# Patient Record
Sex: Male | Born: 1965 | Race: Black or African American | Hispanic: No | Marital: Single | Smoking: Never smoker
Health system: Southern US, Community
[De-identification: ages and names within clinical notes are randomized; demographics above are authoritative.]

## PROBLEM LIST (undated history)

## (undated) DIAGNOSIS — M549 Dorsalgia, unspecified: Secondary | ICD-10-CM

## (undated) DIAGNOSIS — E785 Hyperlipidemia, unspecified: Secondary | ICD-10-CM

## (undated) HISTORY — DX: Dorsalgia, unspecified: M54.9

## (undated) HISTORY — DX: Hyperlipidemia, unspecified: E78.5

---

## 2017-01-06 ENCOUNTER — Ambulatory Visit (INDEPENDENT_AMBULATORY_CARE_PROVIDER_SITE_OTHER): Payer: 59 | Admitting: Family Medicine

## 2017-01-06 ENCOUNTER — Encounter: Payer: Self-pay | Admitting: Family Medicine

## 2017-01-06 VITALS — BP 99/59 | HR 88 | Temp 97.7°F | Ht 73.0 in | Wt 217.0 lb

## 2017-01-06 DIAGNOSIS — M545 Low back pain, unspecified: Secondary | ICD-10-CM

## 2017-01-06 DIAGNOSIS — G8929 Other chronic pain: Secondary | ICD-10-CM | POA: Diagnosis not present

## 2017-01-06 DIAGNOSIS — Z87448 Personal history of other diseases of urinary system: Secondary | ICD-10-CM | POA: Diagnosis not present

## 2017-01-06 DIAGNOSIS — E785 Hyperlipidemia, unspecified: Secondary | ICD-10-CM | POA: Diagnosis not present

## 2017-01-06 LAB — URINALYSIS
BILIRUBIN UA: NEGATIVE
Glucose, UA: NEGATIVE
Ketones, UA: NEGATIVE
Leukocytes, UA: NEGATIVE
NITRITE UA: NEGATIVE
PH UA: 7 (ref 5.0–7.5)
Protein, UA: NEGATIVE
SPEC GRAV UA: 1.02 (ref 1.005–1.030)
Urobilinogen, Ur: 0.2 mg/dL (ref 0.2–1.0)

## 2017-01-06 MED ORDER — CIALIS 20 MG PO TABS: 20.0000 mg | ORAL_TABLET | ORAL | 5 refills | Status: DC | PRN

## 2017-01-06 MED ORDER — ATORVASTATIN CALCIUM 40 MG PO TABS: 40.0000 mg | ORAL_TABLET | Freq: Every day | ORAL | 1 refills | Status: DC

## 2017-01-06 NOTE — Progress Notes (Signed)
Subjective:  Patient ID: Ryan Rojas, male    DOB: [birth year]  Age: 51 y.o. MRN: [id number]  CC: New Patient (Initial Visit) (pt here today to establish care, no concerns voiced.)   HPI Ryan Rojas presents for New patient visit and exam. He wants to become a patient here. Very frustrated that his previous physician could never worked him in when he needed them instead he was always told To go to the ER or wait several weeks for an appointment. The patient has been in [place] for about 6 years. He moved here from the [place] of [place]. He is divorced. He's never been a smoker. He has put on a few pounds recently and wants to take those back off. He currently is having no acute symptoms. However, he does see a back doctor/neurologist at the [facility] in [place]. He has chronic midline lower back pain over the small of his back. Patient does take atorvastatin for cholesterol. He denies any side effects from that.  History Ryan Rojas has a past medical history of Hyperlipidemia.   He has no past surgical history on file.   His family history includes Asthma in his mother and sister; Diabetes in his sister.He reports that he has never smoked. He has never used smokeless tobacco. He reports that he drinks about 0.6 oz of alcohol per week . He reports that he does not use drugs.  No current outpatient prescriptions on file prior to visit.   No current facility-administered medications on file prior to visit.     ROS Review of Systems  Constitutional: Negative for activity change, appetite change, chills, diaphoresis, fatigue, fever and unexpected weight change.  HENT: Negative for congestion, ear pain, hearing loss, postnasal drip, rhinorrhea, sore throat, tinnitus and trouble swallowing.   Eyes: Negative for photophobia, pain, discharge and redness.  Respiratory: Negative for apnea, cough, choking, chest tightness, shortness of breath, wheezing and stridor.   Cardiovascular:  Negative for chest pain, palpitations and leg swelling.  Gastrointestinal: Negative for abdominal distention, abdominal pain, blood in stool, constipation, diarrhea, nausea and vomiting.  Endocrine: Negative for cold intolerance, heat intolerance, polydipsia, polyphagia and polyuria.  Genitourinary: Positive for hematuria (this has been chronicEver since he was a teenager). Negative for difficulty urinating, dysuria, enuresis, flank pain, frequency, genital sores and urgency.  Musculoskeletal: Positive for back pain. Negative for arthralgias and joint swelling.  Skin: Negative for color change, rash and wound.  Allergic/Immunologic: Negative for immunocompromised state.  Neurological: Negative for dizziness, tremors, seizures, syncope, facial asymmetry, speech difficulty, weakness, light-headedness, numbness and headaches.  Hematological: Does not bruise/bleed easily.  Psychiatric/Behavioral: Negative for agitation, behavioral problems, confusion, decreased concentration, dysphoric mood, hallucinations, sleep disturbance and suicidal ideas. The patient is not nervous/anxious and is not hyperactive.     Objective:  BP (!) 99/59   Pulse 88   Temp 97.7 F (36.5 C) (Oral)   Ht _0  (1.854 m)   Wt 217 lb (98.4 kg)   BMI 28.63 kg/m   Physical Exam  Constitutional: He is oriented to person, place, and time. He appears well-developed and well-nourished.  HENT:  Head: Normocephalic and atraumatic.  Mouth/Throat: Oropharynx is clear and moist.  Eyes: Pupils are equal, round, and reactive to light. EOM are normal.  Neck: Normal range of motion. No tracheal deviation present. No thyromegaly present.  Cardiovascular: Normal rate, regular rhythm and normal heart sounds.  Exam reveals no gallop and no friction rub.   No murmur heard. Pulmonary/Chest: Breath  sounds normal. He has no wheezes. He has no rales.  Abdominal: Soft. He exhibits no mass. There is no tenderness.  Musculoskeletal: Normal  range of motion. He exhibits no edema.  Neurological: He is alert and oriented to person, place, and time.  Skin: Skin is warm and dry.  Psychiatric: He has a normal mood and affect.    Assessment & Plan:   Ryan Rojas was seen today for new patient (initial visit).  Diagnoses and all orders for this visit:  Hyperlipidemia, unspecified hyperlipidemia type -     CBC with Differential/Platelet -     CMP14+EGFR -     Lipid panel  History of hematuria -     Urinalysis  Chronic midline low back pain without sciatica  Other orders -     atorvastatin (LIPITOR) 40 MG tablet; Take 1 tablet (40 mg total) by mouth daily. -     CIALIS 20 MG tablet; Take 1 tablet (20 mg total) by mouth as needed.   I have changed Mr. Ryan Rojas atorvastatin and CIALIS.  Meds ordered this encounter  Medications  . DISCONTD: atorvastatin (LIPITOR) 40 MG tablet    Sig: Take 40 mg by mouth daily.  Marland Kitchen DISCONTD: CIALIS 20 MG tablet    Sig: Take 20 mg by mouth as needed.    Refill:  5  . atorvastatin (LIPITOR) 40 MG tablet    Sig: Take 1 tablet (40 mg total) by mouth daily.    Dispense:  90 tablet    Refill:  1  . CIALIS 20 MG tablet    Sig: Take 1 tablet (20 mg total) by mouth as needed.    Dispense:  10 tablet    Refill:  5     Follow-up: Return in about 6 months (around 07/07/2017).  Claretta Fraise, M.D.

## 2017-01-07 LAB — LIPID PANEL
CHOL/HDL RATIO: 2.6 ratio (ref 0.0–5.0)
Cholesterol, Total: 211 mg/dL — ABNORMAL HIGH (ref 100–199)
HDL: 81 mg/dL (ref >39)
LDL Calculated: 115 mg/dL — ABNORMAL HIGH (ref 0–99)
Triglycerides: 74 mg/dL (ref 0–149)
VLDL Cholesterol Cal: 15 mg/dL (ref 5–40)

## 2017-01-07 LAB — CBC WITH DIFFERENTIAL/PLATELET
BASOS: 1 %
Basophils Absolute: 0 10*3/uL (ref 0.0–0.2)
EOS (ABSOLUTE): 0.2 10*3/uL (ref 0.0–0.4)
EOS: 3 %
HEMATOCRIT: 45.6 % (ref 37.5–51.0)
Hemoglobin: 14.3 g/dL (ref 13.0–17.7)
IMMATURE GRANS (ABS): 0 10*3/uL (ref 0.0–0.1)
IMMATURE GRANULOCYTES: 0 %
Lymphocytes Absolute: 2.3 10*3/uL (ref 0.7–3.1)
Lymphs: 38 %
MCH: 27.7 pg (ref 26.6–33.0)
MCHC: 31.4 g/dL — ABNORMAL LOW (ref 31.5–35.7)
MCV: 88 fL (ref 79–97)
MONOS ABS: 0.5 10*3/uL (ref 0.1–0.9)
Monocytes: 7 %
NEUTROS ABS: 3.1 10*3/uL (ref 1.4–7.0)
NEUTROS PCT: 51 %
Platelets: 257 10*3/uL (ref 150–379)
RBC: 5.17 x10E6/uL (ref 4.14–5.80)
RDW: 14.7 % (ref 12.3–15.4)
WBC: 6.1 10*3/uL (ref 3.4–10.8)

## 2017-01-07 LAB — CMP14+EGFR
A/G RATIO: 1.9 (ref 1.2–2.2)
ALT: 24 IU/L (ref 0–44)
AST: 31 IU/L (ref 0–40)
Albumin: 5 g/dL (ref 3.5–5.5)
Alkaline Phosphatase: 58 IU/L (ref 39–117)
BILIRUBIN TOTAL: 0.5 mg/dL (ref 0.0–1.2)
BUN/Creatinine Ratio: 13 (ref 9–20)
BUN: 15 mg/dL (ref 6–24)
CALCIUM: 9.8 mg/dL (ref 8.7–10.2)
CHLORIDE: 97 mmol/L (ref 96–106)
CO2: 27 mmol/L (ref 20–29)
Creatinine, Ser: 1.19 mg/dL (ref 0.76–1.27)
GFR, EST AFRICAN AMERICAN: 82 mL/min/{1.73_m2} (ref >59)
GFR, EST NON AFRICAN AMERICAN: 71 mL/min/{1.73_m2} (ref >59)
GLOBULIN, TOTAL: 2.6 g/dL (ref 1.5–4.5)
Glucose: 68 mg/dL (ref 65–99)
POTASSIUM: 4.5 mmol/L (ref 3.5–5.2)
SODIUM: 142 mmol/L (ref 134–144)
TOTAL PROTEIN: 7.6 g/dL (ref 6.0–8.5)

## 2017-01-21 ENCOUNTER — Encounter: Payer: Self-pay | Admitting: *Deleted

## 2017-04-06 ENCOUNTER — Telehealth: Payer: Self-pay | Admitting: Family Medicine

## 2017-04-07 ENCOUNTER — Other Ambulatory Visit: Payer: Self-pay

## 2017-04-07 ENCOUNTER — Other Ambulatory Visit: Payer: Self-pay | Admitting: Family Medicine

## 2017-04-07 MED ORDER — CIALIS 20 MG PO TABS: 20.0000 mg | ORAL_TABLET | ORAL | 5 refills | Status: DC | PRN

## 2017-04-07 MED ORDER — SILDENAFIL CITRATE 20 MG PO TABS: 20.0000 mg | ORAL_TABLET | Freq: Every day | ORAL | 5 refills | Status: DC | PRN

## 2017-04-07 MED ORDER — ATORVASTATIN CALCIUM 40 MG PO TABS: 40.0000 mg | ORAL_TABLET | Freq: Every day | ORAL | 1 refills | Status: DC

## 2017-04-07 NOTE — Telephone Encounter (Signed)
Please contact the patient and let him know that I [facility] 3 requested [facility] [place]

## 2017-04-07 NOTE — Telephone Encounter (Signed)
Patient notified that all meds sent to pharmacy

## 2017-04-07 NOTE — Telephone Encounter (Signed)
Spoke with patient and atorvastatin and cialis that was sent in last oct was never received by the pharmacy. Confirmed with pharmacy and resent meds. Patient also wanting to know if he can have a rx for Viagra as well so he can alternate and see which one works better. Please advise

## 2017-04-09 ENCOUNTER — Telehealth: Payer: Self-pay

## 2017-04-09 NOTE — Telephone Encounter (Signed)
Insurance denied prior auth for Cialis

## 2017-04-09 NOTE — Telephone Encounter (Signed)
Please contact the patient that he noted that the brand name is not covered.  See if he would like to switch to generic Viagra.

## 2017-04-09 NOTE — Telephone Encounter (Signed)
Attempted to contact patient - NA °

## 2017-04-09 NOTE — Telephone Encounter (Signed)
Pt doesn't want to try generic viagra and says he will just pay out of pocket for the Cialis.

## 2017-07-07 ENCOUNTER — Ambulatory Visit: Payer: 59 | Admitting: Family Medicine

## 2017-07-16 ENCOUNTER — Encounter: Payer: Self-pay | Admitting: Family Medicine

## 2017-07-16 ENCOUNTER — Ambulatory Visit: Payer: 59 | Admitting: Family Medicine

## 2017-07-16 VITALS — BP 128/84 | HR 72 | Temp 97.0°F | Ht 73.0 in | Wt 223.4 lb

## 2017-07-16 DIAGNOSIS — R059 Cough, unspecified: Secondary | ICD-10-CM

## 2017-07-16 DIAGNOSIS — N5203 Combined arterial insufficiency and corporo-venous occlusive erectile dysfunction: Secondary | ICD-10-CM | POA: Diagnosis not present

## 2017-07-16 DIAGNOSIS — R05 Cough: Secondary | ICD-10-CM | POA: Diagnosis not present

## 2017-07-16 DIAGNOSIS — E785 Hyperlipidemia, unspecified: Secondary | ICD-10-CM | POA: Insufficient documentation

## 2017-07-16 DIAGNOSIS — E782 Mixed hyperlipidemia: Secondary | ICD-10-CM

## 2017-07-16 MED ORDER — SILDENAFIL CITRATE 100 MG PO TABS: 50.0000 mg | ORAL_TABLET | Freq: Every day | ORAL | 1 refills | Status: DC | PRN

## 2017-07-16 MED ORDER — CIALIS 20 MG PO TABS: 20.0000 mg | ORAL_TABLET | ORAL | 1 refills | Status: DC | PRN

## 2017-07-16 MED ORDER — ATORVASTATIN CALCIUM 40 MG PO TABS: 40.0000 mg | ORAL_TABLET | Freq: Every day | ORAL | 1 refills | Status: DC

## 2017-07-16 MED ORDER — SILDENAFIL CITRATE 20 MG PO TABS: 20.0000 mg | ORAL_TABLET | Freq: Every day | ORAL | 5 refills | Status: DC | PRN

## 2017-07-16 MED ORDER — PREDNISONE 10 MG (48) PO TBPK: ORAL_TABLET | ORAL | 0 refills | Status: DC

## 2017-07-16 NOTE — Patient Instructions (Signed)
Fat and Cholesterol Restricted Diet Getting too much fat and cholesterol in your diet may cause health problems. Following this diet helps keep your fat and cholesterol at normal levels. This can keep you from getting sick. What types of fat should I choose?  Choose monosaturated and polyunsaturated fats. These are found in foods such as olive oil, canola oil, flaxseeds, walnuts, almonds, and seeds.  Eat more omega-3 fats. Good choices include salmon, mackerel, sardines, tuna, flaxseed oil, and ground flaxseeds.  Limit saturated fats. These are in animal products such as meats, butter, and cream. They can also be in plant products such as palm oil, palm kernel oil, and coconut oil.  Avoid foods with partially hydrogenated oils in them. These contain trans fats. Examples of foods that have trans fats are stick margarine, some tub margarines, cookies, crackers, and other baked goods. What general guidelines do I need to follow?  Check food labels. Look for the words "trans fat" and "saturated fat."  When preparing a meal: ? Fill half of your plate with vegetables and green salads. ? Fill one fourth of your plate with whole grains. Look for the word "whole" as the first word in the ingredient list. ? Fill one fourth of your plate with lean protein foods.  Eat more foods that have fiber, like apples, carrots, beans, peas, and barley.  Eat more home-cooked foods. Eat less at restaurants and buffets.  Limit or avoid alcohol.  Limit foods high in starch and sugar.  Limit fried foods.  Cook foods without frying them. Baking, boiling, grilling, and broiling are all great options.  Lose weight if you are overweight. Losing even a small amount of weight can help your overall health. It can also help prevent diseases such as diabetes and heart disease. What foods can I eat? Grains Whole grains, such as whole wheat or whole grain breads, crackers, cereals, and pasta. Unsweetened oatmeal,  bulgur, barley, quinoa, or brown rice. Corn or whole wheat flour tortillas. Vegetables Fresh or frozen vegetables (raw, steamed, roasted, or grilled). Green salads. Fruits All fresh, canned (in natural juice), or frozen fruits. Meat and Other Protein Products Ground beef (85% or leaner), grass-fed beef, or beef trimmed of fat. Skinless chicken or turkey. Ground chicken or turkey. Pork trimmed of fat. All fish and seafood. Eggs. Dried beans, peas, or lentils. Unsalted nuts or seeds. Unsalted canned or dry beans. Dairy Low-fat dairy products, such as skim or 1% milk, 2% or reduced-fat cheeses, low-fat ricotta or cottage cheese, or plain low-fat yogurt. Fats and Oils Tub margarines without trans fats. Light or reduced-fat mayonnaise and salad dressings. Avocado. Olive, canola, sesame, or safflower oils. Natural peanut or almond butter (choose ones without added sugar and oil). The items listed above may not be a complete list of recommended foods or beverages. Contact your dietitian for more options. What foods are not recommended? Grains White bread. White pasta. White rice. Cornbread. Bagels, pastries, and croissants. Crackers that contain trans fat. Vegetables White potatoes. Corn. Creamed or fried vegetables. Vegetables in a cheese sauce. Fruits Dried fruits. Canned fruit in light or heavy syrup. Fruit juice. Meat and Other Protein Products Fatty cuts of meat. Ribs, chicken wings, bacon, sausage, bologna, salami, chitterlings, fatback, hot dogs, bratwurst, and packaged luncheon meats. Liver and organ meats. Dairy Whole or 2% milk, cream, half-and-half, and cream cheese. Whole milk cheeses. Whole-fat or sweetened yogurt. Full-fat cheeses. Nondairy creamers and whipped toppings. Processed cheese, cheese spreads, or cheese curds. Sweets and Desserts Corn   syrup, sugars, honey, and molasses. Candy. Jam and jelly. Syrup. Sweetened cereals. Cookies, pies, cakes, donuts, muffins, and ice  cream. Fats and Oils Butter, stick margarine, lard, shortening, ghee, or bacon fat. Coconut, palm kernel, or palm oils. Beverages Alcohol. Sweetened drinks (such as sodas, lemonade, and fruit drinks or punches). The items listed above may not be a complete list of foods and beverages to avoid. Contact your dietitian for more information. This information is not intended to replace advice given to you by your health care provider. Make sure you discuss any questions you have with your health care provider. Document Released: 09/01/2011 Document Revised: 11/07/2015 Document Reviewed: 06/01/2013 Elsevier Interactive Patient Education  2018 Elsevier Inc.  

## 2017-07-16 NOTE — Progress Notes (Signed)
Subjective:  Patient ID: Ryan Rojas, male    DOB: [birth year]  Age: 52 y.o. MRN: [id number]  CC: No chief complaint on file.   HPI Ryan Rojas presents for follow-up of elevated cholesterol. Doing well without complaints on current medication. Denies side effects of statin including myalgia and arthralgia and nausea. Also in today for liver function testing. Currently no chest pain, shortness of breath or other cardiovascular related symptoms noted.  Patient would like to have prescriptions for Cialis, Viagra, sildenafil.  He would like to shop them due to insurance increase.  His Cialis went up to $400 per month.  He tried the sildenafil but said it was ineffective on the other hand he only tried 1 pill at a time.  After discussion he is willing to try it again using up to 5 pills per episode in order to save some money. History Ryan Rojas has a past medical history of Hyperlipidemia.   He has no past surgical history on file.   His family history includes Asthma in his mother and sister; Diabetes in his sister.He reports that he has never smoked. He has never used smokeless tobacco. He reports that he drinks about 0.6 oz of alcohol per week. He reports that he does not use drugs.  No current outpatient medications on file prior to visit.   No current facility-administered medications on file prior to visit.     ROS Review of Systems  Constitutional: Negative.   HENT: Negative.   Eyes: Negative for visual disturbance.  Respiratory: Positive for cough (Started after a bout of the flu several weeks ago.  It is nonproductive.  It is not associated with any fever.  There is no shortness of breath.). Negative for shortness of breath.   Cardiovascular: Negative for chest pain and leg swelling.  Gastrointestinal: Negative for abdominal pain, diarrhea, nausea and vomiting.  Genitourinary: Negative for difficulty urinating.  Musculoskeletal: Negative for arthralgias and myalgias.  Skin:  Negative for rash.  Neurological: Negative for headaches.  Psychiatric/Behavioral: Negative for sleep disturbance.    Objective:  BP 128/84   Pulse 72   Temp (!) 97 F (36.1 C) (Oral)   Ht 6' 1"  (1.854 m)   Wt 223 lb 6 oz (101.3 kg)   BMI 29.47 kg/m   BP Readings from Last 3 Encounters:  07/16/17 128/84  01/06/17 (!) 99/59    Wt Readings from Last 3 Encounters:  07/16/17 223 lb 6 oz (101.3 kg)  01/06/17 217 lb (98.4 kg)     Physical Exam  Constitutional: He is oriented to person, place, and time. He appears well-developed and well-nourished. No distress.  HENT:  Head: Normocephalic and atraumatic.  Right Ear: External ear normal.  Left Ear: External ear normal.  Nose: Nose normal.  Mouth/Throat: Oropharynx is clear and moist.  Eyes: Pupils are equal, round, and reactive to light. Conjunctivae and EOM are normal.  Neck: Normal range of motion. Neck supple. No thyromegaly present.  Cardiovascular: Normal rate, regular rhythm and normal heart sounds.  No murmur heard. Pulmonary/Chest: Effort normal and breath sounds normal. No respiratory distress. He has no wheezes. He has no rales.  Abdominal: Soft. Bowel sounds are normal. He exhibits no distension. There is no tenderness.  Lymphadenopathy:    He has no cervical adenopathy.  Neurological: He is alert and oriented to person, place, and time. He has normal reflexes.  Skin: Skin is warm and dry.  Psychiatric: He has a normal mood and affect. His behavior is  normal. Judgment and thought content normal.    No results found for: HGBA1C  Lab Results  Component Value Date   WBC 6.1 01/06/2017   HGB 14.3 01/06/2017   HCT 45.6 01/06/2017   PLT 257 01/06/2017   GLUCOSE 68 01/06/2017   CHOL 211 (H) 01/06/2017   TRIG 74 01/06/2017   HDL 81 01/06/2017   LDLCALC 115 (H) 01/06/2017   ALT 24 01/06/2017   AST 31 01/06/2017   NA 142 01/06/2017   K 4.5 01/06/2017   CL 97 01/06/2017   CREATININE 1.19 01/06/2017   BUN 15  01/06/2017   CO2 27 01/06/2017    Patient was never admitted.  Assessment & Plan:   Diagnoses and all orders for this visit:  Combined arterial insufficiency and corporo-venous occlusive erectile dysfunction -     CIALIS 20 MG tablet; Take 1 tablet (20 mg total) by mouth as needed. -     sildenafil (REVATIO) 20 MG tablet; Take 1 tablet (20 mg total) by mouth daily as needed (2-5 as needed). -     sildenafil (VIAGRA) 100 MG tablet; Take 0.5-1 tablets (50-100 mg total) by mouth daily as needed for erectile dysfunction.  Mixed hyperlipidemia -     atorvastatin (LIPITOR) 40 MG tablet; Take 1 tablet (40 mg total) by mouth daily. -     CMP14+EGFR -     Lipid panel  Cough -     predniSONE (STERAPRED UNI-PAK 48 TAB) 10 MG (48) TBPK tablet; Take as directed   I am having Ryan Rojas start on sildenafil and predniSONE. I am also having him maintain his CIALIS, sildenafil, and atorvastatin.  Meds ordered this encounter  Medications  . CIALIS 20 MG tablet    Sig: Take 1 tablet (20 mg total) by mouth as needed.    Dispense:  24 tablet    Refill:  1  . sildenafil (REVATIO) 20 MG tablet    Sig: Take 1 tablet (20 mg total) by mouth daily as needed (2-5 as needed).    Dispense:  50 tablet    Refill:  5  . sildenafil (VIAGRA) 100 MG tablet    Sig: Take 0.5-1 tablets (50-100 mg total) by mouth daily as needed for erectile dysfunction.    Dispense:  24 tablet    Refill:  1  . predniSONE (STERAPRED UNI-PAK 48 TAB) 10 MG (48) TBPK tablet    Sig: Take as directed    Dispense:  48 tablet    Refill:  0  . atorvastatin (LIPITOR) 40 MG tablet    Sig: Take 1 tablet (40 mg total) by mouth daily.    Dispense:  90 tablet    Refill:  1     Follow-up: Return in about 6 months (around 01/16/2018) for Wellness.  Claretta Fraise, M.D.

## 2017-07-17 LAB — CMP14+EGFR
ALT: 32 IU/L (ref 0–44)
AST: 76 IU/L — ABNORMAL HIGH (ref 0–40)
Albumin/Globulin Ratio: 2.2 (ref 1.2–2.2)
Albumin: 4.8 g/dL (ref 3.5–5.5)
Alkaline Phosphatase: 62 IU/L (ref 39–117)
BUN/Creatinine Ratio: 8 — ABNORMAL LOW (ref 9–20)
BUN: 9 mg/dL (ref 6–24)
Bilirubin Total: 0.4 mg/dL (ref 0.0–1.2)
CO2: 26 mmol/L (ref 20–29)
Calcium: 9.5 mg/dL (ref 8.7–10.2)
Chloride: 101 mmol/L (ref 96–106)
Creatinine, Ser: 1.13 mg/dL (ref 0.76–1.27)
GFR calc Af Amer: 88 mL/min/{1.73_m2} (ref >59)
GFR calc non Af Amer: 76 mL/min/{1.73_m2} (ref >59)
Globulin, Total: 2.2 g/dL (ref 1.5–4.5)
Glucose: 96 mg/dL (ref 65–99)
Potassium: 4.2 mmol/L (ref 3.5–5.2)
Sodium: 141 mmol/L (ref 134–144)
Total Protein: 7 g/dL (ref 6.0–8.5)

## 2017-07-17 LAB — LIPID PANEL
Chol/HDL Ratio: 3 ratio (ref 0.0–5.0)
Cholesterol, Total: 164 mg/dL (ref 100–199)
HDL: 55 mg/dL (ref >39)
LDL Calculated: 98 mg/dL (ref 0–99)
TRIGLYCERIDES: 54 mg/dL (ref 0–149)
VLDL CHOLESTEROL CAL: 11 mg/dL (ref 5–40)

## 2017-07-20 ENCOUNTER — Telehealth: Payer: Self-pay

## 2017-07-20 DIAGNOSIS — N5203 Combined arterial insufficiency and corporo-venous occlusive erectile dysfunction: Secondary | ICD-10-CM

## 2017-07-20 NOTE — Telephone Encounter (Signed)
Insurance denied prior auth for Sildenafil   Not approved for erectile dysfunction

## 2017-07-20 NOTE — Telephone Encounter (Signed)
Explained to patient that he should have these run through as cash and not use his insurance.  He reports he had spoken with you at his appointment and had planned to get written prescriptions for the Cialis, Viagra and Sildenafil so that he can research online [facility] and get medication from there.  So, he is requesting written prescriptions for all three.  Please advise and route to pools.

## 2017-07-20 NOTE — Telephone Encounter (Signed)
Please print written versions of each from his med list and he can pick them up

## 2017-07-20 NOTE — Telephone Encounter (Signed)
Please contact the patient tell him it is usually cheaper to pay cash for this than it is to get anything else even with insurance.

## 2017-07-21 MED ORDER — SILDENAFIL CITRATE 20 MG PO TABS: 20.0000 mg | ORAL_TABLET | Freq: Every day | ORAL | 5 refills | Status: DC | PRN

## 2017-07-21 MED ORDER — CIALIS 20 MG PO TABS: 20.0000 mg | ORAL_TABLET | ORAL | 1 refills | Status: DC | PRN

## 2017-07-21 MED ORDER — SILDENAFIL CITRATE 100 MG PO TABS: 50.0000 mg | ORAL_TABLET | Freq: Every day | ORAL | 1 refills | Status: DC | PRN

## 2017-07-21 NOTE — Telephone Encounter (Signed)
Rx print- Waiting on provider to sign. Patient aware

## 2017-07-21 NOTE — Addendum Note (Signed)
Addended by: Angela Adam on: 07/21/2017 09:26 AM   Modules accepted: Orders

## 2017-09-06 ENCOUNTER — Ambulatory Visit: Payer: 59 | Admitting: Family Medicine

## 2018-01-18 ENCOUNTER — Encounter: Payer: Self-pay | Admitting: Family Medicine

## 2018-01-18 ENCOUNTER — Ambulatory Visit (INDEPENDENT_AMBULATORY_CARE_PROVIDER_SITE_OTHER): Payer: 59 | Admitting: Family Medicine

## 2018-01-18 VITALS — Temp 97.7°F | Ht 73.0 in | Wt 223.8 lb

## 2018-01-18 DIAGNOSIS — N5203 Combined arterial insufficiency and corporo-venous occlusive erectile dysfunction: Secondary | ICD-10-CM

## 2018-01-18 DIAGNOSIS — Z1211 Encounter for screening for malignant neoplasm of colon: Secondary | ICD-10-CM

## 2018-01-18 DIAGNOSIS — N529 Male erectile dysfunction, unspecified: Secondary | ICD-10-CM | POA: Insufficient documentation

## 2018-01-18 DIAGNOSIS — IMO0002 Reserved for concepts with insufficient information to code with codable children: Secondary | ICD-10-CM | POA: Insufficient documentation

## 2018-01-18 DIAGNOSIS — Z0000 Encounter for general adult medical examination without abnormal findings: Secondary | ICD-10-CM

## 2018-01-18 DIAGNOSIS — Z3009 Encounter for other general counseling and advice on contraception: Secondary | ICD-10-CM

## 2018-01-18 DIAGNOSIS — E782 Mixed hyperlipidemia: Secondary | ICD-10-CM

## 2018-01-18 LAB — URINALYSIS
Bilirubin, UA: NEGATIVE
GLUCOSE, UA: NEGATIVE
KETONES UA: NEGATIVE
NITRITE UA: NEGATIVE
Protein, UA: NEGATIVE
RBC UA: NEGATIVE
SPEC GRAV UA: 1.02 (ref 1.005–1.030)
Urobilinogen, Ur: 0.2 mg/dL (ref 0.2–1.0)
pH, UA: 7 (ref 5.0–7.5)

## 2018-01-18 MED ORDER — SILDENAFIL CITRATE 20 MG PO TABS: 20.0000 mg | ORAL_TABLET | Freq: Every day | ORAL | 5 refills | Status: DC | PRN

## 2018-01-18 MED ORDER — ATORVASTATIN CALCIUM 40 MG PO TABS: 40.0000 mg | ORAL_TABLET | Freq: Every day | ORAL | 3 refills | Status: DC

## 2018-01-18 MED ORDER — CIALIS 20 MG PO TABS: 20.0000 mg | ORAL_TABLET | ORAL | 3 refills | Status: DC | PRN

## 2018-01-18 MED ORDER — SILDENAFIL CITRATE 100 MG PO TABS: 50.0000 mg | ORAL_TABLET | Freq: Every day | ORAL | 3 refills | Status: DC | PRN

## 2018-01-18 NOTE — Progress Notes (Signed)
Subjective:  Patient ID: Ryan Rojas, male    DOB: 08-09-1965  Age: 52 y.o. MRN: [id number]  CC: Annual Exam   HPI Ryan Rojas presents for annual physical examination.  Patient is curious about getting Cologuard versus colonoscopy.  He is also interested in getting a vasectomy.  He continues to use and needs prescriptions to shop to find out what version is going to be cheapest for him.  He sent to [facility] in [place]. Depression screen [facility] 2/9 01/18/2018 07/16/2017 01/06/2017  Decreased Interest 0 0 0  Down, Depressed, Hopeless 0 0 0  PHQ - 2 Score 0 0 0    History Ryan Rojas has a past medical history of Hyperlipidemia.   He has no past surgical history on file.   His family history includes Asthma in his mother and sister; Diabetes in his sister.He reports that he has never smoked. He has never used smokeless tobacco. He reports that he drinks about 1.0 standard drinks of alcohol per week. He reports that he does not use drugs.    ROS Review of Systems  Constitutional: Negative for activity change, fatigue and unexpected weight change.  HENT: Negative for congestion, ear pain, hearing loss, postnasal drip and trouble swallowing.   Eyes: Negative for pain and visual disturbance.  Respiratory: Negative for cough, chest tightness and shortness of breath.   Cardiovascular: Negative for chest pain, palpitations and leg swelling.  Gastrointestinal: Negative for abdominal distention, abdominal pain, blood in stool, constipation, diarrhea, nausea and vomiting.  Endocrine: Negative for cold intolerance, heat intolerance and polydipsia.  Genitourinary: Negative for difficulty urinating, dysuria, flank pain, frequency and urgency.  Musculoskeletal: Negative for arthralgias and joint swelling.  Skin: Negative for color change, rash and wound.  Neurological: Negative for dizziness, syncope, speech difficulty, weakness, light-headedness, numbness and headaches.  Hematological: Does not bruise/bleed  easily.  Psychiatric/Behavioral: Negative for confusion, decreased concentration, dysphoric mood and sleep disturbance. The patient is not nervous/anxious.     Objective:  Temp 97.7 F (36.5 C) (Oral)   Ht 6' 1"  (1.854 m)   Wt 223 lb 12.8 oz (101.5 kg)   BMI 29.53 kg/m   BP Readings from Last 3 Encounters:  07/16/17 128/84  01/06/17 (!) 99/59    Wt Readings from Last 3 Encounters:  01/18/18 223 lb 12.8 oz (101.5 kg)  07/16/17 223 lb 6 oz (101.3 kg)  01/06/17 217 lb (98.4 kg)     Physical Exam  Constitutional: He is oriented to person, place, and time. He appears well-developed and well-nourished.  HENT:  Head: Normocephalic and atraumatic.  Mouth/Throat: Oropharynx is clear and moist.  Eyes: Pupils are equal, round, and reactive to light. EOM are normal.  Neck: Normal range of motion. No tracheal deviation present. No thyromegaly present.  Cardiovascular: Normal rate, regular rhythm and normal heart sounds. Exam reveals no gallop and no friction rub.  No murmur heard. Pulmonary/Chest: Breath sounds normal. He has no wheezes. He has no rales.  Abdominal: Soft. Bowel sounds are normal. He exhibits no distension and no mass. There is no tenderness. Hernia confirmed negative in the right inguinal area and confirmed negative in the left inguinal area.  Genitourinary: Testes normal and penis normal.  Musculoskeletal: Normal range of motion. He exhibits no edema.  Lymphadenopathy:    He has no cervical adenopathy.  Neurological: He is alert and oriented to person, place, and time.  Skin: Skin is warm and dry.  Psychiatric: He has a normal mood and affect.  Assessment & Plan:   Ryan Rojas was seen today for annual exam.  Diagnoses and all orders for this visit:  Mixed hyperlipidemia -     CBC with Differential/Platelet -     CMP14+EGFR -     Lipid panel -     Urinalysis -     VITAMIN D 25 Hydroxy (Vit-D Deficiency, Fractures) -     PSA, total and free -      atorvastatin (LIPITOR) 40 MG tablet; Take 1 tablet (40 mg total) by mouth daily.  Combined arterial insufficiency and corporo-venous occlusive erectile dysfunction -     CBC with Differential/Platelet -     CMP14+EGFR -     Lipid panel -     Urinalysis -     VITAMIN D 25 Hydroxy (Vit-D Deficiency, Fractures) -     PSA, total and free -     Discontinue: CIALIS 20 MG tablet; Take 1 tablet (20 mg total) by mouth as needed. -     Discontinue: sildenafil (VIAGRA) 100 MG tablet; Take 0.5-1 tablets (50-100 mg total) by mouth daily as needed for erectile dysfunction. -     Discontinue: sildenafil (REVATIO) 20 MG tablet; Take 1 tablet (20 mg total) by mouth daily as needed (2-5 as needed). -     Discontinue: sildenafil (VIAGRA) 100 MG tablet; Take 0.5-1 tablets (50-100 mg total) by mouth daily as needed for erectile dysfunction. -     Discontinue: CIALIS 20 MG tablet; Take 1 tablet (20 mg total) by mouth as needed. -     sildenafil (VIAGRA) 100 MG tablet; Take 0.5-1 tablets (50-100 mg total) by mouth daily as needed for erectile dysfunction. -     sildenafil (REVATIO) 20 MG tablet; Take 1 tablet (20 mg total) by mouth daily as needed (2-5 as needed). -     CIALIS 20 MG tablet; Take 1 tablet (20 mg total) by mouth as needed.  Colon cancer screening -     Cologuard  Sterilization consult -     Ambulatory referral to Urology       I have discontinued Ryan Rojas's predniSONE. I am also having him maintain his atorvastatin, sildenafil, sildenafil, and CIALIS.  Allergies as of 01/18/2018      Reactions   Hydrocodone Itching      Medication List        Accurate as of 01/18/18  5:46 PM. Always use your most recent med list.          atorvastatin 40 MG tablet Commonly known as:  LIPITOR Take 1 tablet (40 mg total) by mouth daily.   CIALIS 20 MG tablet Generic drug:  tadalafil Take 1 tablet (20 mg total) by mouth as needed.   sildenafil 100 MG tablet Commonly known as:  VIAGRA Take  0.5-1 tablets (50-100 mg total) by mouth daily as needed for erectile dysfunction.   sildenafil 20 MG tablet Commonly known as:  REVATIO Take 1 tablet (20 mg total) by mouth daily as needed (2-5 as needed).        Follow-up: Return in about 1 year (around 01/19/2019).  Based on his request to not follow-up sooner.  Claretta Fraise, M.D.

## 2018-01-19 LAB — LIPID PANEL
CHOL/HDL RATIO: 2.6 ratio (ref 0.0–5.0)
Cholesterol, Total: 163 mg/dL (ref 100–199)
HDL: 63 mg/dL (ref >39)
LDL CALC: 91 mg/dL (ref 0–99)
Triglycerides: 47 mg/dL (ref 0–149)
VLDL Cholesterol Cal: 9 mg/dL (ref 5–40)

## 2018-01-19 LAB — CMP14+EGFR
A/G RATIO: 2.2 (ref 1.2–2.2)
ALBUMIN: 4.4 g/dL (ref 3.5–5.5)
ALT: 19 IU/L (ref 0–44)
AST: 19 IU/L (ref 0–40)
Alkaline Phosphatase: 53 IU/L (ref 39–117)
BUN/Creatinine Ratio: 12 (ref 9–20)
BUN: 12 mg/dL (ref 6–24)
Bilirubin Total: 0.4 mg/dL (ref 0.0–1.2)
CALCIUM: 8.8 mg/dL (ref 8.7–10.2)
CO2: 25 mmol/L (ref 20–29)
Chloride: 102 mmol/L (ref 96–106)
Creatinine, Ser: 1 mg/dL (ref 0.76–1.27)
GFR calc Af Amer: 101 mL/min/{1.73_m2} (ref >59)
GFR, EST NON AFRICAN AMERICAN: 87 mL/min/{1.73_m2} (ref >59)
Globulin, Total: 2 g/dL (ref 1.5–4.5)
Glucose: 93 mg/dL (ref 65–99)
POTASSIUM: 4.2 mmol/L (ref 3.5–5.2)
Sodium: 140 mmol/L (ref 134–144)
TOTAL PROTEIN: 6.4 g/dL (ref 6.0–8.5)

## 2018-01-19 LAB — VITAMIN D 25 HYDROXY (VIT D DEFICIENCY, FRACTURES): Vit D, 25-Hydroxy: 20.5 ng/mL — ABNORMAL LOW (ref 30.0–100.0)

## 2018-01-19 LAB — PSA, TOTAL AND FREE
PSA, Free Pct: 23.6 %
PSA, Free: 0.26 ng/mL
Prostate Specific Ag, Serum: 1.1 ng/mL (ref 0.0–4.0)

## 2018-01-19 LAB — CBC WITH DIFFERENTIAL/PLATELET
Basophils Absolute: 0 10*3/uL (ref 0.0–0.2)
Basos: 1 %
EOS (ABSOLUTE): 0 10*3/uL (ref 0.0–0.4)
EOS: 1 %
Hematocrit: 38.2 % (ref 37.5–51.0)
Hemoglobin: 12.8 g/dL — ABNORMAL LOW (ref 13.0–17.7)
Immature Grans (Abs): 0 10*3/uL (ref 0.0–0.1)
Immature Granulocytes: 0 %
Lymphocytes Absolute: 2.2 10*3/uL (ref 0.7–3.1)
Lymphs: 37 %
MCH: 28.1 pg (ref 26.6–33.0)
MCHC: 33.5 g/dL (ref 31.5–35.7)
MCV: 84 fL (ref 79–97)
Monocytes Absolute: 0.4 10*3/uL (ref 0.1–0.9)
Monocytes: 6 %
Neutrophils Absolute: 3.3 10*3/uL (ref 1.4–7.0)
Neutrophils: 55 %
Platelets: 253 10*3/uL (ref 150–450)
RBC: 4.56 x10E6/uL (ref 4.14–5.80)
RDW: 13.7 % (ref 12.3–15.4)
WBC: 6 10*3/uL (ref 3.4–10.8)

## 2018-01-19 MED ORDER — VITAMIN D (ERGOCALCIFEROL) 1.25 MG (50000 UNIT) PO CAPS: 50000.0000 [IU] | ORAL_CAPSULE | ORAL | 1 refills | Status: DC

## 2018-01-19 NOTE — Addendum Note (Signed)
Addended by: Lorelee Cover C on: 01/19/2018 10:41 AM   Modules accepted: Orders

## 2018-01-21 ENCOUNTER — Other Ambulatory Visit: Payer: Self-pay | Admitting: Family Medicine

## 2018-01-21 DIAGNOSIS — N5203 Combined arterial insufficiency and corporo-venous occlusive erectile dysfunction: Secondary | ICD-10-CM

## 2018-01-21 MED ORDER — CIALIS 20 MG PO TABS: 20.0000 mg | ORAL_TABLET | Freq: Every day | ORAL | 3 refills | Status: DC | PRN

## 2018-07-01 ENCOUNTER — Encounter: Payer: Self-pay | Admitting: Family Medicine

## 2018-07-01 ENCOUNTER — Other Ambulatory Visit: Payer: Self-pay

## 2018-07-01 ENCOUNTER — Ambulatory Visit (INDEPENDENT_AMBULATORY_CARE_PROVIDER_SITE_OTHER): Payer: 59 | Admitting: Family Medicine

## 2018-07-01 DIAGNOSIS — N5203 Combined arterial insufficiency and corporo-venous occlusive erectile dysfunction: Secondary | ICD-10-CM

## 2018-07-01 DIAGNOSIS — N529 Male erectile dysfunction, unspecified: Secondary | ICD-10-CM

## 2018-07-01 DIAGNOSIS — J0100 Acute maxillary sinusitis, unspecified: Secondary | ICD-10-CM

## 2018-07-01 DIAGNOSIS — J301 Allergic rhinitis due to pollen: Secondary | ICD-10-CM

## 2018-07-01 DIAGNOSIS — E782 Mixed hyperlipidemia: Secondary | ICD-10-CM

## 2018-07-01 DIAGNOSIS — E785 Hyperlipidemia, unspecified: Secondary | ICD-10-CM

## 2018-07-01 MED ORDER — PREDNISONE 10 MG PO TABS: ORAL_TABLET | ORAL | 0 refills | Status: DC

## 2018-07-01 MED ORDER — SILDENAFIL CITRATE 100 MG PO TABS: 50.0000 mg | ORAL_TABLET | Freq: Every day | ORAL | 3 refills | Status: DC | PRN

## 2018-07-01 MED ORDER — CIALIS 20 MG PO TABS: 20.0000 mg | ORAL_TABLET | Freq: Every day | ORAL | 3 refills | Status: DC | PRN

## 2018-07-01 MED ORDER — ATORVASTATIN CALCIUM 40 MG PO TABS: 40.0000 mg | ORAL_TABLET | Freq: Every day | ORAL | 3 refills | Status: DC

## 2018-07-01 MED ORDER — AMOXICILLIN-POT CLAVULANATE 875-125 MG PO TABS: 1.0000 | ORAL_TABLET | Freq: Two times a day (BID) | ORAL | 0 refills | Status: DC

## 2018-07-01 MED ORDER — SILDENAFIL CITRATE 20 MG PO TABS: 20.0000 mg | ORAL_TABLET | Freq: Every day | ORAL | 5 refills | Status: DC | PRN

## 2018-07-01 NOTE — Progress Notes (Signed)
No chief complaint on file.   HPI  Patient presents today for Symptoms include congestion, facial pain, nasal congestion, non productive cough. No post nasal drip and sinus pressure. There is no fever, chills, or sweats. Onset of symptoms was a few days ago, gradually worsening since that time. Cough is dry, but present for 2 months  Patient in for follow-up of elevated cholesterol. Doing well without complaints on current medication. Denies side effects of statin including myalgia and arthralgia and nausea. Currently no chest pain, shortness of breath or other cardiovascular related symptoms noted.  Patient states that the sildenafil worked okay for him when he got the blue diamond-shaped pill but the white round ones and the blue around ones did not work for him.  He would also like a new prescription for Cialis  PMH: Smoking status noted ROS: Per HPI  Exam deferred. Pt. Harboring due to COVID 19. Phone visit performed. . Assessment and plan:  1. Acute maxillary sinusitis, recurrence not specified   2. Seasonal allergic rhinitis due to pollen   3. Hyperlipidemia, unspecified hyperlipidemia type   4. Impotence   5. Mixed hyperlipidemia   6. Combined arterial insufficiency and corporo-venous occlusive erectile dysfunction     Meds ordered this encounter  Medications  . amoxicillin-clavulanate (AUGMENTIN) 875-125 MG tablet    Sig: Take 1 tablet by mouth 2 (two) times daily. Take all of this medication    Dispense:  20 tablet    Refill:  0  . predniSONE (DELTASONE) 10 MG tablet    Sig: Take 5 daily for 2 days followed by 4,3,2 and 1 for 2 days each.    Dispense:  30 tablet    Refill:  0  . atorvastatin (LIPITOR) 40 MG tablet    Sig: Take 1 tablet (40 mg total) by mouth daily.    Dispense:  90 tablet    Refill:  3  . sildenafil (REVATIO) 20 MG tablet    Sig: Take 1 tablet (20 mg total) by mouth daily as needed (2-5 as needed).    Dispense:  50 tablet    Refill:  5  . CIALIS  20 MG tablet    Sig: Take 1 tablet (20 mg total) by mouth daily as needed.    Dispense:  90 tablet    Refill:  3  . sildenafil (VIAGRA) 100 MG tablet    Sig: Take 0.5-1 tablets (50-100 mg total) by mouth daily as needed for erectile dysfunction.    Dispense:  90 tablet    Refill:  3    Virtual Visit via telephone Note  I discussed the limitations, risks, security and privacy concerns of performing an evaluation and management service by telephone and the availability of in person appointments. I also discussed with the patient that there may be a patient responsible charge related to this service. The patient expressed understanding and agreed to proceed. Pt. Is at home. Dr. Darlyn Read is in his office.  Follow Up Instructions:   I discussed the assessment and treatment plan with the patient. The patient was provided an opportunity to ask questions and all were answered. The patient agreed with the plan and demonstrated an understanding of the instructions.   The patient was advised to call back or seek an in-person evaluation if the symptoms worsen or if the condition fails to improve as anticipated.  Visit started: 1:10 Call ended:  1:35 Total minutes including chart review and phone contact time: 30  Follow up six months  Claretta Fraise, MD

## 2018-07-05 ENCOUNTER — Telehealth: Payer: Self-pay | Admitting: *Deleted

## 2018-07-05 NOTE — Telephone Encounter (Signed)
Fax from [employer] clarification Sildenafil (Revatio) 20 mg tab 1 tab (20 mg toatl) QD prn (2-5 prn_ Please clarify sig and resend

## 2018-07-06 ENCOUNTER — Other Ambulatory Visit: Payer: Self-pay | Admitting: Family Medicine

## 2018-07-06 DIAGNOSIS — N5203 Combined arterial insufficiency and corporo-venous occlusive erectile dysfunction: Secondary | ICD-10-CM

## 2018-07-06 MED ORDER — SILDENAFIL CITRATE 20 MG PO TABS: ORAL_TABLET | ORAL | 5 refills | Status: DC

## 2018-11-29 ENCOUNTER — Other Ambulatory Visit: Payer: Self-pay

## 2018-11-29 ENCOUNTER — Ambulatory Visit (INDEPENDENT_AMBULATORY_CARE_PROVIDER_SITE_OTHER): Payer: Managed Care, Other (non HMO) | Admitting: Family Medicine

## 2018-11-29 ENCOUNTER — Encounter: Payer: Self-pay | Admitting: *Deleted

## 2018-11-29 ENCOUNTER — Encounter: Payer: Self-pay | Admitting: Family Medicine

## 2018-11-29 DIAGNOSIS — M5431 Sciatica, right side: Secondary | ICD-10-CM

## 2018-11-29 DIAGNOSIS — N5203 Combined arterial insufficiency and corporo-venous occlusive erectile dysfunction: Secondary | ICD-10-CM

## 2018-11-29 MED ORDER — DICLOFENAC SODIUM 75 MG PO TBEC: 75.0000 mg | DELAYED_RELEASE_TABLET | Freq: Two times a day (BID) | ORAL | 2 refills | Status: DC

## 2018-11-29 MED ORDER — TRAMADOL HCL 50 MG PO TABS: 50.0000 mg | ORAL_TABLET | Freq: Four times a day (QID) | ORAL | 0 refills | Status: AC

## 2018-11-29 MED ORDER — CIALIS 20 MG PO TABS: 20.0000 mg | ORAL_TABLET | Freq: Every day | ORAL | 3 refills | Status: DC | PRN

## 2018-11-29 MED ORDER — CYCLOBENZAPRINE HCL 10 MG PO TABS: 10.0000 mg | ORAL_TABLET | Freq: Three times a day (TID) | ORAL | 1 refills | Status: DC | PRN

## 2018-11-29 NOTE — Progress Notes (Signed)
Subjective:    Patient ID: Ryan Rojas, adult    DOB: 10/02/1965, 53 y.o.   MRN: [id number]   HPI: Ryan Rojas is a 53 y.o. adult presenting for low back pain. Off and on for years. Had an old tramadol. It helped some.NKI. Onset 3 days ago. Starting yesterday, can't stand up straight. Radiating down right leg. 5-6/10. Dull pain , "semi-cramp."    [facility] 01/18/2018 07/16/2017 01/06/2017  Decreased Interest 0 0 0  Down, Depressed, Hopeless 0 0 0  PHQ - 2 Score 0 0 0     Relevant past medical, surgical, family and social history reviewed and updated as indicated.  Interim medical history since our last visit reviewed. Allergies and medications reviewed and updated.  ROS:  Review of Systems  Constitutional: Negative for chills, diaphoresis and fever.  HENT: Negative for sore throat.   Respiratory: Negative for shortness of breath.   Cardiovascular: Negative for chest pain.  Gastrointestinal: Negative for abdominal pain.  Musculoskeletal: Positive for arthralgias, back pain, gait problem and myalgias. Negative for neck pain.  Skin: Negative for rash.  Neurological: Positive for weakness. Negative for numbness.     Social History   Tobacco Use  Smoking Status Never Smoker  Smokeless Tobacco Never Used       Objective:     Wt Readings from Last 3 Encounters:  01/18/18 223 lb 12.8 oz (101.5 kg)  07/16/17 223 lb 6 oz (101.3 kg)  01/06/17 217 lb (98.4 kg)     Exam deferred. Pt. Harboring due to COVID 19. Phone visit performed.   Assessment & Plan:   1. Sciatica of right side   2. Combined arterial insufficiency and corporo-venous occlusive erectile dysfunction     Meds ordered this encounter  Medications  . CIALIS 20 MG tablet    Sig: Take 1 tablet (20 mg total) by mouth daily as needed.    Dispense:  12 tablet    Refill:  3  . traMADol (ULTRAM) 50 MG tablet    Sig: Take 1 tablet (50 mg total) by mouth 4 (four) times daily for 5 days. 1-2  tablets up to 4 times a day as needed for pain    Dispense:  20 tablet    Refill:  0  . diclofenac (VOLTAREN) 75 MG EC tablet    Sig: Take 1 tablet (75 mg total) by mouth 2 (two) times daily. For muscle and  Joint pain    Dispense:  60 tablet    Refill:  2  . cyclobenzaprine (FLEXERIL) 10 MG tablet    Sig: Take 1 tablet (10 mg total) by mouth 3 (three) times daily as needed for muscle spasms.    Dispense:  90 tablet    Refill:  1        Diagnoses and all orders for this visit:  Sciatica of right side  Combined arterial insufficiency and corporo-venous occlusive erectile dysfunction -     CIALIS 20 MG tablet; Take 1 tablet (20 mg total) by mouth daily as needed.  Other orders -     traMADol (ULTRAM) 50 MG tablet; Take 1 tablet (50 mg total) by mouth 4 (four) times daily for 5 days. 1-2 tablets up to 4 times a day as needed for pain -     diclofenac (VOLTAREN) 75 MG EC tablet; Take 1 tablet (75 mg total) by mouth 2 (two) times daily. For muscle and  Joint pain -     cyclobenzaprine (  FLEXERIL) 10 MG tablet; Take 1 tablet (10 mg total) by mouth 3 (three) times daily as needed for muscle spasms.    Virtual Visit via telephone Note  I discussed the limitations, risks, security and privacy concerns of performing an evaluation and management service by telephone and the availability of in person appointments. The patient was identified with two identifiers. Pt.expressed understanding and agreed to proceed. Pt. Is at home. Dr. Livia Snellen is in his office.  Follow Up Instructions:   I discussed the assessment and treatment plan with the patient. The patient was provided an opportunity to ask questions and all were answered. The patient agreed with the plan and demonstrated an understanding of the instructions.   The patient was advised to call back or seek an in-person evaluation if the symptoms worsen or if the condition fails to improve as anticipated.   Total minutes including chart  review and phone contact time: 24   Follow up plan: Return in about 2 weeks (around 12/13/2018), or if symptoms worsen or fail to improve.  Claretta Fraise, MD [place]

## 2018-11-29 NOTE — Progress Notes (Signed)
Pt. Seen in 9:%% appt. Time today. Please see that note. WS erroneous

## 2018-12-13 ENCOUNTER — Telehealth: Payer: Self-pay | Admitting: Family Medicine

## 2018-12-13 NOTE — Telephone Encounter (Signed)
Letter up front - pt aware 

## 2019-01-02 ENCOUNTER — Other Ambulatory Visit: Payer: Self-pay

## 2019-01-02 ENCOUNTER — Ambulatory Visit (INDEPENDENT_AMBULATORY_CARE_PROVIDER_SITE_OTHER): Payer: Managed Care, Other (non HMO)

## 2019-01-02 ENCOUNTER — Encounter: Payer: Self-pay | Admitting: Family Medicine

## 2019-01-02 ENCOUNTER — Ambulatory Visit (INDEPENDENT_AMBULATORY_CARE_PROVIDER_SITE_OTHER): Payer: Managed Care, Other (non HMO) | Admitting: Family Medicine

## 2019-01-02 VITALS — BP 111/67 | HR 75 | Temp 97.3°F | Resp 20 | Ht 73.0 in | Wt 228.0 lb

## 2019-01-02 DIAGNOSIS — M25511 Pain in right shoulder: Secondary | ICD-10-CM

## 2019-01-02 MED ORDER — NAPROXEN 500 MG PO TABS: 500.0000 mg | ORAL_TABLET | Freq: Two times a day (BID) | ORAL | 0 refills | Status: AC

## 2019-01-02 NOTE — Patient Instructions (Signed)
Shoulder Pain Many things can cause shoulder pain, including:  An injury.  Moving the shoulder in the same way again and again (overuse).  Joint pain (arthritis). Pain can come from:  Swelling and irritation (inflammation) of any part of the shoulder.  An injury to the shoulder joint.  An injury to: ? Tissues that connect muscle to bone (tendons). ? Tissues that connect bones to each other (ligaments). ? Bones. Follow these instructions at home: Watch for changes in your symptoms. Let your doctor know about them. Follow these instructions to help with your pain. If you have a sling:  Wear the sling as told by your doctor. Remove it only as told by your doctor.  Loosen the sling if your fingers: ? Tingle. ? Become numb. ? Turn cold and blue.  Keep the sling clean.  If the sling is not waterproof: ? Do not let it get wet. ? Take the sling off when you shower or bathe. Managing pain, stiffness, and swelling   If told, put ice on the painful area: ? Put ice in a plastic bag. ? Place a towel between your skin and the bag. ? Leave the ice on for 20 minutes, 2-3 times a day. Stop putting ice on if it does not help with the pain.  Squeeze a soft ball or a foam pad as much as possible. This prevents swelling in the shoulder. It also helps to strengthen the arm. General instructions  Take over-the-counter and prescription medicines only as told by your doctor.  Keep all follow-up visits as told by your doctor. This is important. Contact a doctor if:  Your pain gets worse.  Medicine does not help your pain.  You have new pain in your arm, hand, or fingers. Get help right away if:  Your arm, hand, or fingers: ? Tingle. ? Are numb. ? Are swollen. ? Are painful. ? Turn white or blue. Summary  Shoulder pain can be caused by many things. These include injury, moving the shoulder in the same away again and again, and joint pain.  Watch for changes in your symptoms.  Let your doctor know about them.  This condition may be treated with a sling, ice, and pain medicine.  Contact your doctor if the pain gets worse or you have new pain. Get help right away if your arm, hand, or fingers tingle or get numb, swollen, or painful.  Keep all follow-up visits as told by your doctor. This is important. This information is not intended to replace advice given to you by your health care provider. Make sure you discuss any questions you have with your health care provider. Document Released: 08/19/2007 Document Revised: 09/14/2017 Document Reviewed: 09/14/2017 Elsevier Patient Education  2020 [employer] Inc[employer]  

## 2019-01-02 NOTE — Progress Notes (Signed)
Subjective:  Patient ID: Ryan Rojas, adult    DOB: 1965-03-27, 53 y.o.   MRN: [id number]  Patient Care Team: Claretta Fraise, MD as PCP - General (Family Medicine)   Chief Complaint:  right shoulder pain (started SAT )   HPI: Ryan Rojas is a 53 y.o. adult presenting on 01/02/2019 for right shoulder pain (started SAT )   Pt presents today with right shoulder pain that started on Saturday. Pt comes into clinic today with arm in sling. He denies injury. States he was working on his car when the pain started.   Shoulder Pain  The pain is present in the right shoulder. This is a new problem. The current episode started in the past 7 days. The problem occurs constantly. The problem has been unchanged. The pain is at a severity of 6/10. The pain is moderate. Associated symptoms include a limited range of motion. Pertinent negatives include no fever, itching, joint locking, joint swelling, numbness, stiffness or tingling. The symptoms are aggravated by activity. She has tried NSAIDS and rest for the symptoms. The treatment provided mild relief.      Relevant past medical, surgical, family, and social history reviewed and updated as indicated.  Allergies and medications reviewed and updated. Date reviewed: Chart in Epic.   Past Medical History:  Diagnosis Date  . Hyperlipidemia     History reviewed. No pertinent surgical history.  Social History   Socioeconomic History  . Marital status: Single    Spouse name: Not on file  . Number of children: Not on file  . Years of education: Not on file  . Highest education level: Not on file  Occupational History  . Not on file  Social Needs  . Financial resource strain: Not on file  . Food insecurity    Worry: Not on file    Inability: Not on file  . Transportation needs    Medical: Not on file    Non-medical: Not on file  Tobacco Use  . Smoking status: Never Smoker  . Smokeless tobacco: Never Used  Substance and Sexual  Activity  . Alcohol use: Yes    Alcohol/week: 1.0 standard drinks    Types: 1 Cans of beer per week  . Drug use: No  . Sexual activity: Yes    Birth control/protection: None  Lifestyle  . Physical activity    Days per week: Not on file    Minutes per session: Not on file  . Stress: Not on file  Relationships  . Social Herbalist on phone: Not on file    Gets together: Not on file    Attends religious service: Not on file    Active member of club or organization: Not on file    Attends meetings of clubs or organizations: Not on file    Relationship status: Not on file  . Intimate partner violence    Fear of current or ex partner: Not on file    Emotionally abused: Not on file    Physically abused: Not on file    Forced sexual activity: Not on file  Other Topics Concern  . Not on file  Social History Narrative  . Not on file    Outpatient Encounter Medications as of 01/02/2019  Medication Sig  . atorvastatin (LIPITOR) 40 MG tablet Take 1 tablet (40 mg total) by mouth daily.  Marland Kitchen CIALIS 20 MG tablet Take 1 tablet (20 mg total) by mouth daily as needed.  Marland Kitchen  cyclobenzaprine (FLEXERIL) 10 MG tablet Take 1 tablet (10 mg total) by mouth 3 (three) times daily as needed for muscle spasms. (Patient not taking: Reported on 01/02/2019)  . diclofenac (VOLTAREN) 75 MG EC tablet Take 1 tablet (75 mg total) by mouth 2 (two) times daily. For muscle and  Joint pain (Patient not taking: Reported on 01/02/2019)  . naproxen (NAPROSYN) 500 MG tablet Take 1 tablet (500 mg total) by mouth 2 (two) times daily with a meal for 14 days.  . [DISCONTINUED] Vitamin D, Ergocalciferol, (DRISDOL) 1.25 MG (50000 UT) CAPS capsule Take 1 capsule (50,000 Units total) by mouth every 7 (seven) days.   No facility-administered encounter medications on file as of 01/02/2019.     Allergies  Allergen Reactions  . Hydrocodone Itching    Review of Systems  Constitutional: Negative for activity change,  appetite change, chills, diaphoresis, fatigue, fever and unexpected weight change.  HENT: Negative.   Eyes: Negative.  Negative for photophobia and visual disturbance.  Respiratory: Negative for cough, chest tightness and shortness of breath.   Cardiovascular: Negative for chest pain, palpitations and leg swelling.  Gastrointestinal: Negative for blood in stool, constipation, diarrhea, nausea and vomiting.  Endocrine: Negative.   Genitourinary: Negative for dysuria, frequency and urgency.  Musculoskeletal: Positive for arthralgias and myalgias. Negative for back pain, gait problem, joint swelling, neck pain, neck stiffness and stiffness.  Skin: Negative.  Negative for color change, itching and pallor.  Allergic/Immunologic: Negative.   Neurological: Negative for dizziness, tingling, tremors, weakness, numbness and headaches.  Hematological: Negative.   Psychiatric/Behavioral: Negative for confusion, hallucinations, sleep disturbance and suicidal ideas.  All other systems reviewed and are negative.       Objective:  BP 111/67   Pulse 75   Temp (!) 97.3 F (36.3 C)   Resp 20   Ht 6' 1"  (1.854 m)   Wt 228 lb (103.4 kg)   SpO2 99%   BMI 30.08 kg/m    Wt Readings from Last 3 Encounters:  01/02/19 228 lb (103.4 kg)  01/18/18 223 lb 12.8 oz (101.5 kg)  07/16/17 223 lb 6 oz (101.3 kg)    Physical Exam Vitals signs and nursing note reviewed.  Constitutional:      General: She is not in acute distress.    Appearance: Normal appearance. She is well-developed and well-groomed. She is not ill-appearing, toxic-appearing or diaphoretic.  HENT:     Head: Normocephalic and atraumatic.     Jaw: There is normal jaw occlusion.     Right Ear: Hearing normal.     Left Ear: Hearing normal.     Nose: Nose normal.     Mouth/Throat:     Lips: Pink.     Mouth: Mucous membranes are moist.     Pharynx: Oropharynx is clear. Uvula midline.  Eyes:     General: Lids are normal.     Extraocular  Movements: Extraocular movements intact.     Conjunctiva/sclera: Conjunctivae normal.     Pupils: Pupils are equal, round, and reactive to light.  Neck:     Musculoskeletal: Normal range of motion and neck supple.     Thyroid: No thyroid mass, thyromegaly or thyroid tenderness.     Vascular: No carotid bruit or JVD.     Trachea: Trachea and phonation normal.  Cardiovascular:     Rate and Rhythm: Normal rate and regular rhythm.     Chest Wall: PMI is not displaced.     Pulses: Normal pulses.  Heart sounds: Normal heart sounds. No murmur. No friction rub. No gallop.   Pulmonary:     Effort: Pulmonary effort is normal. No respiratory distress.     Breath sounds: Normal breath sounds. No wheezing.  Abdominal:     General: Bowel sounds are normal. There is no distension or abdominal bruit.     Palpations: Abdomen is soft. There is no hepatomegaly or splenomegaly.     Tenderness: There is no abdominal tenderness. There is no right CVA tenderness or left CVA tenderness.     Hernia: No hernia is present.  Musculoskeletal:        General: No swelling, deformity or signs of injury.     Right shoulder: She exhibits decreased range of motion, tenderness, pain and decreased strength. She exhibits no bony tenderness, no swelling, no effusion, no crepitus, no deformity, no laceration, no spasm and normal pulse.     Right elbow: Normal.    Cervical back: Normal.     Right lower leg: No edema.     Left lower leg: No edema.     Comments: Right shoulder: slight tenderness to palpation. Positive Neer's, Hawkins, and Jobe's test. Limited abduction, significant pain at approximately 70 degrees. Pt unwilling to let examiner abduct arm past this point due to discomfort. Shoulder moved freely to this point.   Lymphadenopathy:     Cervical: No cervical adenopathy.  Skin:    General: Skin is warm and dry.     Capillary Refill: Capillary refill takes less than 2 seconds.     Coloration: Skin is not  cyanotic, jaundiced or pale.     Findings: No rash.  Neurological:     General: No focal deficit present.     Mental Status: She is alert and oriented to person, place, and time.     Cranial Nerves: Cranial nerves are intact. No cranial nerve deficit.     Sensory: Sensation is intact. No sensory deficit.     Motor: Motor function is intact. No weakness.     Coordination: Coordination is intact. Coordination normal.     Gait: Gait is intact. Gait normal.     Deep Tendon Reflexes: Reflexes are normal and symmetric. Reflexes normal.  Psychiatric:        Attention and Perception: Attention and perception normal.        Mood and Affect: Mood and affect normal.        Speech: Speech normal.        Behavior: Behavior normal. Behavior is cooperative.        Thought Content: Thought content normal.        Cognition and Memory: Cognition and memory normal.        Judgment: Judgment normal.     Results for orders placed or performed in visit on 01/18/18  CBC with Differential/Platelet  Result Value Ref Range   WBC 6.0 3.4 - 10.8 x10E3/uL   RBC 4.56 4.14 - 5.80 x10E6/uL   Hemoglobin 12.8 (L) 13.0 - 17.7 g/dL   Hematocrit 38.2 37.5 - 51.0 %   MCV 84 79 - 97 fL   MCH 28.1 26.6 - 33.0 pg   MCHC 33.5 31.5 - 35.7 g/dL   RDW 13.7 12.3 - 15.4 %   Platelets 253 150 - 450 x10E3/uL   Neutrophils 55 Not Estab. %   Lymphs 37 Not Estab. %   Monocytes 6 Not Estab. %   Eos 1 Not Estab. %   Basos 1 Not Estab. %  Neutrophils Absolute 3.3 1.4 - 7.0 x10E3/uL   Lymphocytes Absolute 2.2 0.7 - 3.1 x10E3/uL   Monocytes Absolute 0.4 0.1 - 0.9 x10E3/uL   EOS (ABSOLUTE) 0.0 0.0 - 0.4 x10E3/uL   Basophils Absolute 0.0 0.0 - 0.2 x10E3/uL   Immature Granulocytes 0 Not Estab. %   Immature Grans (Abs) 0.0 0.0 - 0.1 x10E3/uL  CMP14+EGFR  Result Value Ref Range   Glucose 93 65 - 99 mg/dL   BUN 12 6 - 24 mg/dL   Creatinine, Ser 1.00 0.76 - 1.27 mg/dL   GFR calc non Af Amer 87 >59 mL/min/1.73   GFR calc Af  Amer 101 >59 mL/min/1.73   BUN/Creatinine Ratio 12 9 - 20   Sodium 140 134 - 144 mmol/L   Potassium 4.2 3.5 - 5.2 mmol/L   Chloride 102 96 - 106 mmol/L   CO2 25 20 - 29 mmol/L   Calcium 8.8 8.7 - 10.2 mg/dL   Total Protein 6.4 6.0 - 8.5 g/dL   Albumin 4.4 3.5 - 5.5 g/dL   Globulin, Total 2.0 1.5 - 4.5 g/dL   Albumin/Globulin Ratio 2.2 1.2 - 2.2   Bilirubin Total 0.4 0.0 - 1.2 mg/dL   Alkaline Phosphatase 53 39 - 117 IU/L   AST 19 0 - 40 IU/L   ALT 19 0 - 44 IU/L  Lipid panel  Result Value Ref Range   Cholesterol, Total 163 100 - 199 mg/dL   Triglycerides 47 0 - 149 mg/dL   HDL 63 >39 mg/dL   VLDL Cholesterol Cal 9 5 - 40 mg/dL   LDL Calculated 91 0 - 99 mg/dL   Chol/HDL Ratio 2.6 0.0 - 5.0 ratio  Urinalysis  Result Value Ref Range   Specific Gravity, UA 1.020 1.005 - 1.030   pH, UA 7.0 5.0 - 7.5   Color, UA Yellow Yellow   Appearance Ur Clear Clear   Leukocytes, UA 1+ (A) Negative   Protein, UA Negative Negative/Trace   Glucose, UA Negative Negative   Ketones, UA Negative Negative   RBC, UA Negative Negative   Bilirubin, UA Negative Negative   Urobilinogen, Ur 0.2 0.2 - 1.0 mg/dL   Nitrite, UA Negative Negative  VITAMIN D 25 Hydroxy (Vit-D Deficiency, Fractures)  Result Value Ref Range   Vit D, 25-Hydroxy 20.5 (L) 30.0 - 100.0 ng/mL  PSA, total and free  Result Value Ref Range   Prostate Specific Ag, Serum 1.1 0.0 - 4.0 ng/mL   PSA, Free 0.26 N/A ng/mL   PSA, Free Pct 23.6 %     X-Ray: right shoulder: No acute findings. Preliminary x-ray reading by Monia Pouch, FNP-C, [facility].   Pertinent labs & imaging results that were available during my care of the patient were reviewed by me and considered in my medical decision making.  Assessment & Plan:  Renel was seen today for right shoulder pain.  Diagnoses and all orders for this visit:  Acute pain of right shoulder No abnormalities noted on xray. Limited ROM due to discomfort. Declined PT. Due to degree of pain  with ROM exam, will refer to ortho. Naproxen twice daily for 14 days. Symptomatic care discussed in detail. Pt aware to report any new or worsening symptoms. Follow up as discussed.  -     DG Shoulder Right; Future -     Ambulatory referral to Orthopedic Surgery -     naproxen (NAPROSYN) 500 MG tablet; Take 1 tablet (500 mg total) by mouth 2 (two) times daily with a  meal for 14 days.     Continue all other maintenance medications.  Follow up plan: Return in about 4 weeks (around 01/30/2019), or if symptoms worsen or fail to improve.  Continue healthy lifestyle choices, including diet (rich in fruits, vegetables, and lean proteins, and low in salt and simple carbohydrates) and exercise (at least 30 minutes of moderate physical activity daily).  Educational handout given for shoulder pain  The above assessment and management plan was discussed with the patient. The patient verbalized understanding of and has agreed to the management plan. Patient is aware to call the clinic if they develop any new symptoms or if symptoms persist or worsen. Patient is aware when to return to the clinic for a follow-up visit. Patient educated on when it is appropriate to go to the emergency department.   Monia Pouch, FNP-C [facility] [phone]

## 2019-02-01 ENCOUNTER — Encounter: Payer: Self-pay | Admitting: Family Medicine

## 2019-02-01 ENCOUNTER — Ambulatory Visit (INDEPENDENT_AMBULATORY_CARE_PROVIDER_SITE_OTHER): Payer: Managed Care, Other (non HMO) | Admitting: Family Medicine

## 2019-02-01 ENCOUNTER — Other Ambulatory Visit: Payer: Self-pay

## 2019-02-01 DIAGNOSIS — U071 COVID-19: Secondary | ICD-10-CM

## 2019-02-01 NOTE — Progress Notes (Signed)
    Subjective:    Patient ID: Ryan Rojas, adult    DOB: Sep 24, 1965, 53 y.o.   MRN: [id number]   HPI: Ryan Rojas is a 53 y.o. adult presenting for COVID. Dx 10 days ago. Tired and short of breath. Voice is hoarse. Not sleeping much. Has a headache.Okay to walk around the house a little slower than usual without feeling dyspneic. No fever no cough. No GI sx.   Depression screen [facility] 2/9 01/02/2019 01/18/2018 07/16/2017 01/06/2017  Decreased Interest 0 0 0 0  Down, Depressed, Hopeless 0 0 0 0  PHQ - 2 Score 0 0 0 0     Relevant past medical, surgical, family and social history reviewed and updated as indicated.  Interim medical history since our last visit reviewed. Allergies and medications reviewed and updated.  ROS:  Review of Systems  Constitutional: Negative for fever.  Respiratory: Positive for shortness of breath (mild DOE). Negative for cough.   Cardiovascular: Negative for chest pain.  Gastrointestinal: Negative for diarrhea and nausea.  Musculoskeletal: Negative for arthralgias.  Skin: Negative for rash.  Neurological: Positive for headaches.     Social History   Tobacco Use  Smoking Status Never Smoker  Smokeless Tobacco Never Used       Objective:     Wt Readings from Last 3 Encounters:  01/02/19 228 lb (103.4 kg)  01/18/18 223 lb 12.8 oz (101.5 kg)  07/16/17 223 lb 6 oz (101.3 kg)     Exam deferred. Pt. Harboring due to COVID 19. Phone visit performed.   Assessment & Plan:   1. COVID-19 virus infection     No orders of the defined types were placed in this encounter.   No orders of the defined types were placed in this encounter.     Diagnoses and all orders for this visit:  COVID-19 virus infection  OTC remedies, rest at home. Symptom free X3 days necessary before returning to work. Follow up immediately if dyspnea worsens.  Virtual Visit via telephone Note  I discussed the limitations, risks, security and privacy concerns of  performing an evaluation and management service by telephone and the availability of in person appointments. The patient was identified with two identifiers. Pt.expressed understanding and agreed to proceed. Pt. Is at home. Dr. Livia Snellen is in his office.  Follow Up Instructions:   I discussed the assessment and treatment plan with the patient. The patient was provided an opportunity to ask questions and all were answered. The patient agreed with the plan and demonstrated an understanding of the instructions.   The patient was advised to call back or seek an in-person evaluation if the symptoms worsen or if the condition fails to improve as anticipated.   Total minutes including chart review and phone contact time: 10   Follow up plan: Return if symptoms worsen or fail to improve.  Claretta Fraise, MD [place]

## 2019-02-02 ENCOUNTER — Other Ambulatory Visit: Payer: Self-pay | Admitting: Family Medicine

## 2019-02-07 ENCOUNTER — Telehealth: Payer: Self-pay | Admitting: Family Medicine

## 2019-02-07 ENCOUNTER — Other Ambulatory Visit: Payer: Self-pay | Admitting: Family Medicine

## 2019-02-07 DIAGNOSIS — N5203 Combined arterial insufficiency and corporo-venous occlusive erectile dysfunction: Secondary | ICD-10-CM

## 2019-02-07 MED ORDER — SILDENAFIL CITRATE 100 MG PO TABS: 50.0000 mg | ORAL_TABLET | Freq: Every day | ORAL | 1 refills | Status: DC | PRN

## 2019-02-07 NOTE — Telephone Encounter (Signed)
Is this ok to do? Please advise 

## 2019-02-07 NOTE — Telephone Encounter (Signed)
What is the name of the medication? sildenafil (VIAGRA) 100 MG tablet   Have you contacted your pharmacy to request a refill? no  Which pharmacy would you like this sent to? [facility]   Patient notified that their request is being sent to the clinical staff for review and that they should receive a call once it is complete. If they do not receive a call within 24 hours they can check with their pharmacy or our office.

## 2019-02-07 NOTE — Telephone Encounter (Signed)
Please contact the patient : He can't go back until the cough has been gone for 3 days, minimum.

## 2019-02-07 NOTE — Telephone Encounter (Signed)
Please advise 

## 2019-02-07 NOTE — Telephone Encounter (Signed)
I sent in the requested prescription 

## 2019-02-08 NOTE — Telephone Encounter (Signed)
Patient notified. See previous telephone note

## 2019-02-08 NOTE — Telephone Encounter (Signed)
Spoke with patient and advised that he had to be symptom free to return to work. He stated that he is still having terrible headaches but all that should be gone by Monday which is the day he wishes to return to work. I advised him to contact the office on Monday and let us know if he is still symptomatic and then we will review for a return to work note. Patient verbalized understanding

## 2019-02-13 ENCOUNTER — Telehealth: Payer: Self-pay | Admitting: Family Medicine

## 2019-02-13 ENCOUNTER — Encounter: Payer: Self-pay | Admitting: Family Medicine

## 2019-02-13 DIAGNOSIS — N5203 Combined arterial insufficiency and corporo-venous occlusive erectile dysfunction: Secondary | ICD-10-CM

## 2019-02-13 MED ORDER — SILDENAFIL CITRATE 100 MG PO TABS: 50.0000 mg | ORAL_TABLET | Freq: Every day | ORAL | 1 refills | Status: DC | PRN

## 2019-02-13 NOTE — Telephone Encounter (Signed)
Patient notified and verbalized understanding. Also requesting that sildenafil be sent to [facility] and he said that [facility] doesn't do that. Rx sent to [facility] in [place] per patients request

## 2019-02-13 NOTE — Telephone Encounter (Signed)
[place] for note? Please advise

## 2019-02-13 NOTE — Telephone Encounter (Signed)
Please contact the patient I did a note. Not sure how to get it into my Chart. WS

## 2019-04-16 ENCOUNTER — Other Ambulatory Visit: Payer: Self-pay | Admitting: Family Medicine

## 2019-06-10 ENCOUNTER — Other Ambulatory Visit: Payer: Self-pay | Admitting: Family Medicine

## 2019-08-09 ENCOUNTER — Other Ambulatory Visit: Payer: Self-pay | Admitting: Family Medicine

## 2019-08-09 DIAGNOSIS — N5203 Combined arterial insufficiency and corporo-venous occlusive erectile dysfunction: Secondary | ICD-10-CM

## 2019-08-09 DIAGNOSIS — E782 Mixed hyperlipidemia: Secondary | ICD-10-CM

## 2019-09-06 ENCOUNTER — Other Ambulatory Visit: Payer: Self-pay | Admitting: Family Medicine

## 2019-09-06 DIAGNOSIS — E782 Mixed hyperlipidemia: Secondary | ICD-10-CM

## 2019-09-06 NOTE — Telephone Encounter (Signed)
Stacks. NTBS LOV for Dx 07/01/18 others since have all been acute

## 2019-09-07 ENCOUNTER — Other Ambulatory Visit: Payer: Self-pay | Admitting: Family Medicine

## 2019-09-07 DIAGNOSIS — N5203 Combined arterial insufficiency and corporo-venous occlusive erectile dysfunction: Secondary | ICD-10-CM

## 2019-09-08 MED ORDER — ATORVASTATIN CALCIUM 40 MG PO TABS: 40.0000 mg | ORAL_TABLET | Freq: Every day | ORAL | 0 refills | Status: DC

## 2019-09-08 NOTE — Telephone Encounter (Signed)
Request sent to Dr Darlyn Read.

## 2019-09-08 NOTE — Telephone Encounter (Signed)
Appointment scheduled for 09/21/19. 30 day refill given on Atorvastatin. Patient requesting refill on Viagra, advised would send request to provider.

## 2019-09-08 NOTE — Addendum Note (Signed)
Addended by: Adella Hare B on: 09/08/2019 02:56 PM   Modules accepted: Orders

## 2019-09-21 ENCOUNTER — Ambulatory Visit: Payer: Managed Care, Other (non HMO) | Admitting: Family Medicine

## 2019-09-25 ENCOUNTER — Encounter: Payer: Self-pay | Admitting: Family Medicine

## 2019-09-27 ENCOUNTER — Encounter: Payer: Self-pay | Admitting: Family Medicine

## 2019-09-27 ENCOUNTER — Ambulatory Visit (INDEPENDENT_AMBULATORY_CARE_PROVIDER_SITE_OTHER): Payer: Managed Care, Other (non HMO) | Admitting: Family Medicine

## 2019-09-27 ENCOUNTER — Other Ambulatory Visit: Payer: Self-pay

## 2019-09-27 VITALS — BP 139/89 | HR 76 | Temp 97.8°F | Resp 20 | Ht 73.0 in | Wt 238.0 lb

## 2019-09-27 DIAGNOSIS — N5203 Combined arterial insufficiency and corporo-venous occlusive erectile dysfunction: Secondary | ICD-10-CM

## 2019-09-27 DIAGNOSIS — Z23 Encounter for immunization: Secondary | ICD-10-CM

## 2019-09-27 DIAGNOSIS — M25511 Pain in right shoulder: Secondary | ICD-10-CM | POA: Diagnosis not present

## 2019-09-27 DIAGNOSIS — Z125 Encounter for screening for malignant neoplasm of prostate: Secondary | ICD-10-CM

## 2019-09-27 DIAGNOSIS — E782 Mixed hyperlipidemia: Secondary | ICD-10-CM

## 2019-09-27 DIAGNOSIS — G8929 Other chronic pain: Secondary | ICD-10-CM

## 2019-09-27 MED ORDER — ATORVASTATIN CALCIUM 40 MG PO TABS: 40.0000 mg | ORAL_TABLET | Freq: Every day | ORAL | 0 refills | Status: DC

## 2019-09-27 MED ORDER — SILDENAFIL CITRATE 100 MG PO TABS: 50.0000 mg | ORAL_TABLET | Freq: Every day | ORAL | 1 refills | Status: DC | PRN

## 2019-09-27 MED ORDER — DICLOFENAC SODIUM 75 MG PO TBEC: 75.0000 mg | DELAYED_RELEASE_TABLET | Freq: Two times a day (BID) | ORAL | 2 refills | Status: DC

## 2019-09-27 MED ORDER — PREDNISONE 10 MG PO TABS: ORAL_TABLET | ORAL | 0 refills | Status: DC

## 2019-09-27 MED ORDER — CYCLOBENZAPRINE HCL 10 MG PO TABS: ORAL_TABLET | ORAL | 1 refills | Status: DC

## 2019-09-27 NOTE — Progress Notes (Addendum)
Subjective:  Patient ID: Ryan Rojas, adult    DOB: 1966-02-17  Age: 54 y.o. MRN: [id number]  CC: Medication Refill   HPI Ryan Rojas presents for follow-up of elevated cholesterol. Doing well without complaints on current medication. Denies side effects of statin including myalgia and arthralgia and nausea. Also in today for liver function testing. Currently no chest pain, shortness of breath or other cardiovascular related symptoms noted.  Patient is concerned about right shoulder pain.  Is been present for 2 and half months he reports at onset it was 9-10/10.  It has dropped to 3/10 during the day but goes up to 4/10 at night.  He denies any known injury.  He points to the anterior lateral and posterior areas of the shoulder.  He is doing some exercises and that does help.  Patient is also concerned about erectile dysfunction.  History Ryan Rojas has a past medical history of Hyperlipidemia.   She has no past surgical history on file.   Her family history includes Asthma in her mother and sister; Diabetes in her sister.She reports that she has never smoked. She has never used smokeless tobacco. She reports current alcohol use of about 1.0 standard drink of alcohol per week. She reports that she does not use drugs.  No current outpatient medications on file prior to visit.   No current facility-administered medications on file prior to visit.    ROS Review of Systems  Constitutional: Negative for fever.  Respiratory: Negative for shortness of breath.   Cardiovascular: Negative for chest pain.  Musculoskeletal: Negative for arthralgias.  Skin: Negative for rash.    Objective:  BP 139/89   Pulse 76   Temp 97.8 F (36.6 C) (Temporal)   Resp 20   Ht _0  (1.854 m)   Wt 238 lb (108 kg)   SpO2 98%   BMI 31.40 kg/m   BP Readings from Last 3 Encounters:  09/27/19 139/89  01/02/19 111/67  07/16/17 128/84    Wt Readings from Last 3 Encounters:  09/27/19 238 lb (108 kg)   01/02/19 228 lb (103.4 kg)  01/18/18 223 lb 12.8 oz (101.5 kg)     Physical Exam Constitutional:      General: She is not in acute distress.    Appearance: She is well-developed.  HENT:     Head: Normocephalic and atraumatic.     Right Ear: External ear normal.     Left Ear: External ear normal.     Nose: Nose normal.  Eyes:     Conjunctiva/sclera: Conjunctivae normal.     Pupils: Pupils are equal, round, and reactive to light.  Cardiovascular:     Rate and Rhythm: Normal rate and regular rhythm.     Heart sounds: Normal heart sounds. No murmur heard.   Pulmonary:     Effort: Pulmonary effort is normal. No respiratory distress.     Breath sounds: Normal breath sounds. No wheezing or rales.  Abdominal:     Palpations: Abdomen is soft.     Tenderness: There is no abdominal tenderness.  Musculoskeletal:        General: Normal range of motion.     Cervical back: Normal range of motion and neck supple.  Skin:    General: Skin is warm and dry.  Neurological:     Mental Status: She is alert and oriented to person, place, and time.     Deep Tendon Reflexes: Reflexes are normal and symmetric.  Psychiatric:  Behavior: Behavior normal.        Thought Content: Thought content normal.        Judgment: Judgment normal.     No results found for: HGBA1C  Lab Results  Component Value Date   WBC 5.8 09/27/2019   HGB 14.2 09/27/2019   HCT 44.4 09/27/2019   PLT 258 09/27/2019   GLUCOSE 98 09/27/2019   CHOL 190 09/27/2019   TRIG 83 09/27/2019   HDL 51 09/27/2019   LDLCALC 124 (H) 09/27/2019   ALT 30 09/27/2019   AST 31 09/27/2019   NA 141 09/27/2019   K 4.2 09/27/2019   CL 99 09/27/2019   CREATININE 1.09 09/27/2019   BUN 13 09/27/2019   CO2 25 09/27/2019    Patient was never admitted.  Assessment & Plan:   Ryan Rojas was seen today for medication refill.  Diagnoses and all orders for this visit:  Mixed hyperlipidemia -     CMP14+EGFR -     atorvastatin  (LIPITOR) 40 MG tablet; Take 1 tablet (40 mg total) by mouth daily. (Needs to be seen before next refill) -     CBC with Differential/Platelet -     Lipid panel  Combined arterial insufficiency and corporo-venous occlusive erectile dysfunction -     sildenafil (VIAGRA) 100 MG tablet; Take 0.5-1 tablets (50-100 mg total) by mouth daily as needed for erectile dysfunction. -     CBC with Differential/Platelet -     Testosterone,Free and Total  Chronic right shoulder pain -     CBC with Differential/Platelet -     MR Shoulder Right Wo Contrast; Future  Screening for prostate cancer -     PSA Total (Reflex To Free)  Other orders -     Tdap vaccine greater than or equal to 7yo IM -     diclofenac (VOLTAREN) 75 MG EC tablet; Take 1 tablet (75 mg total) by mouth 2 (two) times daily. For muscle and  Joint pain -     cyclobenzaprine (FLEXERIL) 10 MG tablet; TAKE 1 TABLET BY MOUTH THREE TIMES A DAY AS NEEDED FOR MUSCLE SPASMS -     predniSONE (DELTASONE) 10 MG tablet; Take 5 daily for 3 days followed by 4,3,2 and 1 for 3 days each.   I have discontinued [employer]. I have also changed her diclofenac. Additionally, I am having her start on predniSONE. Lastly, I am having her maintain her atorvastatin, sildenafil, and cyclobenzaprine.  Meds ordered this encounter  Medications  . atorvastatin (LIPITOR) 40 MG tablet    Sig: Take 1 tablet (40 mg total) by mouth daily. (Needs to be seen before next refill)    Dispense:  30 tablet    Refill:  0  . diclofenac (VOLTAREN) 75 MG EC tablet    Sig: Take 1 tablet (75 mg total) by mouth 2 (two) times daily. For muscle and  Joint pain    Dispense:  60 tablet    Refill:  2  . sildenafil (VIAGRA) 100 MG tablet    Sig: Take 0.5-1 tablets (50-100 mg total) by mouth daily as needed for erectile dysfunction.    Dispense:  24 tablet    Refill:  1  . cyclobenzaprine (FLEXERIL) 10 MG tablet    Sig: TAKE 1 TABLET BY MOUTH THREE TIMES A DAY AS NEEDED  FOR MUSCLE SPASMS    Dispense:  90 tablet    Refill:  1  . predniSONE (DELTASONE) 10 MG tablet    Sig:  Take 5 daily for 3 days followed by 4,3,2 and 1 for 3 days each.    Dispense:  45 tablet    Refill:  0     Follow-up: Return in about 6 weeks (around 11/08/2019), or if symptoms worsen or fail to improve.  Claretta Fraise, M.D.

## 2019-10-01 ENCOUNTER — Encounter: Payer: Self-pay | Admitting: Family Medicine

## 2019-10-01 LAB — CBC WITH DIFFERENTIAL/PLATELET
Basophils Absolute: 0.1 10*3/uL (ref 0.0–0.2)
Basos: 1 %
EOS (ABSOLUTE): 0.2 10*3/uL (ref 0.0–0.4)
Eos: 4 %
Hematocrit: 44.4 % (ref 37.5–51.0)
Hemoglobin: 14.2 g/dL (ref 13.0–17.7)
Immature Grans (Abs): 0 10*3/uL (ref 0.0–0.1)
Immature Granulocytes: 0 %
Lymphocytes Absolute: 2.2 10*3/uL (ref 0.7–3.1)
Lymphs: 38 %
MCH: 26.9 pg (ref 26.6–33.0)
MCHC: 32 g/dL (ref 31.5–35.7)
MCV: 84 fL (ref 79–97)
Monocytes Absolute: 0.4 10*3/uL (ref 0.1–0.9)
Monocytes: 7 %
Neutrophils Absolute: 2.9 10*3/uL (ref 1.4–7.0)
Neutrophils: 50 %
Platelets: 258 10*3/uL (ref 150–450)
RBC: 5.27 x10E6/uL (ref 4.14–5.80)
RDW: 14.5 % (ref 11.6–15.4)
WBC: 5.8 10*3/uL (ref 3.4–10.8)

## 2019-10-01 LAB — TESTOSTERONE,FREE AND TOTAL
Testosterone, Free: 9.2 pg/mL (ref 7.2–24.0)
Testosterone: 504 ng/dL (ref 264–916)

## 2019-10-01 LAB — LIPID PANEL
Chol/HDL Ratio: 3.7 ratio (ref 0.0–5.0)
Cholesterol, Total: 190 mg/dL (ref 100–199)
HDL: 51 mg/dL (ref >39)
LDL Chol Calc (NIH): 124 mg/dL — ABNORMAL HIGH (ref 0–99)
Triglycerides: 83 mg/dL (ref 0–149)
VLDL Cholesterol Cal: 15 mg/dL (ref 5–40)

## 2019-10-01 LAB — CMP14+EGFR
ALT: 30 IU/L (ref 0–44)
AST: 31 IU/L (ref 0–40)
Albumin/Globulin Ratio: 1.9 (ref 1.2–2.2)
Albumin: 4.7 g/dL (ref 3.8–4.9)
Alkaline Phosphatase: 74 IU/L (ref 48–121)
BUN/Creatinine Ratio: 12 (ref 9–20)
BUN: 13 mg/dL (ref 6–24)
Bilirubin Total: 0.5 mg/dL (ref 0.0–1.2)
CO2: 25 mmol/L (ref 20–29)
Calcium: 9.7 mg/dL (ref 8.7–10.2)
Chloride: 99 mmol/L (ref 96–106)
Creatinine, Ser: 1.09 mg/dL (ref 0.76–1.27)
GFR calc Af Amer: 90 mL/min/{1.73_m2} (ref >59)
GFR calc non Af Amer: 78 mL/min/{1.73_m2} (ref >59)
Globulin, Total: 2.5 g/dL (ref 1.5–4.5)
Glucose: 98 mg/dL (ref 65–99)
Potassium: 4.2 mmol/L (ref 3.5–5.2)
Sodium: 141 mmol/L (ref 134–144)
Total Protein: 7.2 g/dL (ref 6.0–8.5)

## 2019-10-01 LAB — PSA TOTAL (REFLEX TO FREE): Prostate Specific Ag, Serum: 0.5 ng/mL (ref 0.0–4.0)

## 2019-10-05 ENCOUNTER — Other Ambulatory Visit: Payer: Self-pay | Admitting: Family Medicine

## 2019-10-05 DIAGNOSIS — E782 Mixed hyperlipidemia: Secondary | ICD-10-CM

## 2019-11-15 ENCOUNTER — Telehealth: Payer: Self-pay

## 2019-11-15 NOTE — Telephone Encounter (Signed)
Spoke to patient in regards to needing a follow up with Dr. Darlyn Read if no improvement in the shoulder - as per insurance visit is needed to approve MRI.  Per patient shoulder symptoms have improved at this time and MRI is no longer needed right now.  If symptoms develop again patient will contact office for follow up.

## 2019-12-04 ENCOUNTER — Other Ambulatory Visit: Payer: Self-pay

## 2019-12-04 ENCOUNTER — Encounter: Payer: Self-pay | Admitting: Family Medicine

## 2019-12-04 ENCOUNTER — Ambulatory Visit (INDEPENDENT_AMBULATORY_CARE_PROVIDER_SITE_OTHER): Payer: Managed Care, Other (non HMO) | Admitting: Family Medicine

## 2019-12-04 DIAGNOSIS — M545 Low back pain, unspecified: Secondary | ICD-10-CM

## 2019-12-04 MED ORDER — PREDNISONE 10 MG (21) PO TBPK: ORAL_TABLET | ORAL | 0 refills | Status: DC

## 2019-12-04 NOTE — Progress Notes (Signed)
Virtual Visit via Telephone Note  I connected with Ryan Rojas on 12/04/19 at 4:38 PM by telephone and verified that I am speaking with the correct person using two identifiers. Ryan Rojas is currently located at home and nobody is currently with her during this visit. The provider, Gwenlyn Fudge, FNP is located in their office at time of visit.  I discussed the limitations, risks, security and privacy concerns of performing an evaluation and management service by telephone and the availability of in person appointments. I also discussed with the patient that there may be a patient responsible charge related to this service. The patient expressed understanding and agreed to proceed.  Subjective: PCP: Mechele Claude, MD  Chief Complaint  Patient presents with  . Back Pain   Patient reports chronic back pain due to his discs that he gets injections for every 1 to 2 years.  He reports he started having pain 4 days ago that is getting worse.  He states for the past 2 days he has been unable to really get up as lying flat eases the pain.  States if he sits or stands too long he gets in a predicament where he has a hard time changing positions.  He did not go to work today due to the pain.  He has tried Flexeril and Aleve since this has been flared up, neither of which have provided relief.   ROS: Per HPI  Current Outpatient Medications:  .  atorvastatin (LIPITOR) 40 MG tablet, Take 1 tablet (40 mg total) by mouth daily., Disp: 90 tablet, Rfl: 1 .  cyclobenzaprine (FLEXERIL) 10 MG tablet, TAKE 1 TABLET BY MOUTH THREE TIMES A DAY AS NEEDED FOR MUSCLE SPASMS, Disp: 90 tablet, Rfl: 1 .  diclofenac (VOLTAREN) 75 MG EC tablet, Take 1 tablet (75 mg total) by mouth 2 (two) times daily. For muscle and  Joint pain, Disp: 60 tablet, Rfl: 2 .  predniSONE (DELTASONE) 10 MG tablet, Take 5 daily for 3 days followed by 4,3,2 and 1 for 3 days each., Disp: 45 tablet, Rfl: 0 .  sildenafil (VIAGRA) 100 MG  tablet, Take 0.5-1 tablets (50-100 mg total) by mouth daily as needed for erectile dysfunction., Disp: 24 tablet, Rfl: 1  Allergies  Allergen Reactions  . Hydrocodone Itching   Past Medical History:  Diagnosis Date  . Hyperlipidemia     Observations/Objective: A&O  No respiratory distress or wheezing audible over the phone Mood, judgement, and thought processes all WNL   Assessment and Plan: 1. Acute midline low back pain without sciatica - Heating pad, muscle relaxer, and NSAIDs.  - predniSONE (STERAPRED UNI-PAK 21 TAB) 10 MG (21) TBPK tablet; As directed x 6 days  Dispense: 21 tablet; Refill: 0   Follow Up Instructions:  I discussed the assessment and treatment plan with the patient. The patient was provided an opportunity to ask questions and all were answered. The patient agreed with the plan and demonstrated an understanding of the instructions.   The patient was advised to call back or seek an in-person evaluation if the symptoms worsen or if the condition fails to improve as anticipated.  The above assessment and management plan was discussed with the patient. The patient verbalized understanding of and has agreed to the management plan. Patient is aware to call the clinic if symptoms persist or worsen. Patient is aware when to return to the clinic for a follow-up visit. Patient educated on when it is appropriate to go to the emergency  department.   Time call ended: 4:44 PM  I provided 8 minutes of non-face-to-face time during this encounter.  Deliah Boston, MSN, APRN, FNP-C [facility] 12/04/19

## 2019-12-12 ENCOUNTER — Telehealth: Payer: Self-pay | Admitting: Family Medicine

## 2019-12-12 NOTE — Telephone Encounter (Signed)
Patient states he could not stand before but he can now.  States that when he stands he is in pain but he can stand.

## 2019-12-12 NOTE — Telephone Encounter (Signed)
If he can legit not stand, I feel he needs to be seen in the ER.

## 2019-12-12 NOTE — Telephone Encounter (Signed)
He was previously getting injections. Does he need a new referral? Does he want to try physical therapy?

## 2019-12-12 NOTE — Telephone Encounter (Signed)
°  Incoming Patient Call  12/12/2019  What symptoms do you have? Back pain and pt can not stand  How long have you been sick? A couple of weeks   Have you been seen for this problem? Yes televisit with [name] 12/04/19  If your provider decides to give you a prescription, which pharmacy would you like for it to be sent to? [facility]   Patient informed that this information will be sent to the clinical staff for review and that they should receive a follow up call.

## 2019-12-12 NOTE — Telephone Encounter (Signed)
Lmtcb.

## 2019-12-12 NOTE — Telephone Encounter (Signed)
[name],  Do you need to see him in the office? Referral to PT?

## 2019-12-13 NOTE — Telephone Encounter (Signed)
Pt has appt with Harlow Mares on 12/14/19 and will discuss treatment options then

## 2019-12-14 ENCOUNTER — Other Ambulatory Visit: Payer: Self-pay

## 2019-12-14 ENCOUNTER — Encounter: Payer: Self-pay | Admitting: Family Medicine

## 2019-12-14 ENCOUNTER — Ambulatory Visit (INDEPENDENT_AMBULATORY_CARE_PROVIDER_SITE_OTHER): Payer: Managed Care, Other (non HMO) | Admitting: Family Medicine

## 2019-12-14 VITALS — BP 126/83 | HR 97 | Temp 97.8°F | Ht 73.0 in | Wt 237.0 lb

## 2019-12-14 DIAGNOSIS — G8929 Other chronic pain: Secondary | ICD-10-CM | POA: Diagnosis not present

## 2019-12-14 DIAGNOSIS — M545 Low back pain, unspecified: Secondary | ICD-10-CM

## 2019-12-14 MED ORDER — MELOXICAM 15 MG PO TABS: 15.0000 mg | ORAL_TABLET | Freq: Every day | ORAL | 3 refills | Status: DC

## 2019-12-14 NOTE — Progress Notes (Signed)
Subjective: CC: back pain PCP: Claretta Fraise, MD  Ryan Rojas is a 54 y.o. male presenting to clinic today for:  1. Back pain Scotty has a history of chronic lower back pain. He has been having a flare for the last month or so. He reports bilateral lower back pain with frequent muscles spasms. The pain is worse after lying or sitting for periods of time. He reports the pain as moderate. He has missed the last two days of work due to the pain. Denies recent trauma or injury. Denies denies numbness or tingling in his legs. Denies changes is bowel or bladder control. Denies fever, erythema or warmth to his lower back. He has been taking Aleve and flexeril without much relief. He had a phone visit here on 9/20 and was given prednisone for 5 days. He does report improvement with the prednisone. He typically gets epidural injections every 2 years with good relief but he usually waits for the pain to get worse than it is now. He has tried PT in the past and does exercises at home when able.   Relevant past medical, surgical, family, and social history reviewed and updated as indicated.  Allergies and medications reviewed and updated.  Allergies  Allergen Reactions  . Hydrocodone Itching   Past Medical History:  Diagnosis Date  . Hyperlipidemia     Current Outpatient Medications:  .  atorvastatin (LIPITOR) 40 MG tablet, Take 1 tablet (40 mg total) by mouth daily., Disp: 90 tablet, Rfl: 1 .  Naproxen Sodium (ALEVE PO), Take by mouth., Disp: , Rfl:  .  sildenafil (VIAGRA) 100 MG tablet, Take 0.5-1 tablets (50-100 mg total) by mouth daily as needed for erectile dysfunction., Disp: 24 tablet, Rfl: 1 Social History   Socioeconomic History  . Marital status: Single    Spouse name: Not on file  . Number of children: Not on file  . Years of education: Not on file  . Highest education level: Not on file  Occupational History  . Not on file  Tobacco Use  . Smoking status: Never Smoker  .  Smokeless tobacco: Never Used  Vaping Use  . Vaping Use: Never used  Substance and Sexual Activity  . Alcohol use: Yes    Alcohol/week: 1.0 standard drink    Types: 1 Cans of beer per week  . Drug use: No  . Sexual activity: Yes    Birth control/protection: None  Other Topics Concern  . Not on file  Social History Narrative  . Not on file   Social Determinants of Health   Financial Resource Strain:   . Difficulty of Paying Living Expenses: Not on file  Food Insecurity:   . Worried About Charity fundraiser in the Last Year: Not on file  . Ran Out of Food in the Last Year: Not on file  Transportation Needs:   . Lack of Transportation (Medical): Not on file  . Lack of Transportation (Non-Medical): Not on file  Physical Activity:   . Days of Exercise per Week: Not on file  . Minutes of Exercise per Session: Not on file  Stress:   . Feeling of Stress : Not on file  Social Connections:   . Frequency of Communication with Friends and Family: Not on file  . Frequency of Social Gatherings with Friends and Family: Not on file  . Attends Religious Services: Not on file  . Active Member of Clubs or Organizations: Not on file  . Attends Club or  Organization Meetings: Not on file  . Marital Status: Not on file  Intimate Partner Violence:   . Fear of Current or Ex-Partner: Not on file  . Emotionally Abused: Not on file  . Physically Abused: Not on file  . Sexually Abused: Not on file   Family History  Problem Relation Age of Onset  . Asthma Mother   . Diabetes Sister   . Asthma Sister     Review of Systems  Per HPI.   Objective: Office vital signs reviewed. BP 126/83   Pulse 97   Temp 97.8 F (36.6 C) (Temporal)   Ht 6' 1"  (1.854 m)   Wt 237 lb (107.5 kg)   BMI 31.27 kg/m   Physical Examination:  Physical Exam Vitals and nursing note reviewed.  Constitutional:      General: He is not in acute distress.    Appearance: Normal appearance. He is not ill-appearing or  diaphoretic.  Musculoskeletal:     Cervical back: Normal.     Thoracic back: Normal.     Lumbar back: Spasms present. No swelling, edema, deformity, signs of trauma, tenderness or bony tenderness. Negative right straight leg raise test and negative left straight leg raise test.     Right lower leg: No edema.     Left lower leg: No edema.  Skin:    General: Skin is warm and dry.     Findings: No erythema.  Neurological:     General: No focal deficit present.     Mental Status: He is alert and oriented to person, place, and time.  Psychiatric:        Mood and Affect: Mood normal.        Behavior: Behavior normal.     Results for orders placed or performed in visit on 09/27/19  CMP14+EGFR  Result Value Ref Range   Glucose 98 65 - 99 mg/dL   BUN 13 6 - 24 mg/dL   Creatinine, Ser 1.09 0.76 - 1.27 mg/dL   GFR calc non Af Amer 78 >59 mL/min/1.73   GFR calc Af Amer 90 >59 mL/min/1.73   BUN/Creatinine Ratio 12 9 - 20   Sodium 141 134 - 144 mmol/L   Potassium 4.2 3.5 - 5.2 mmol/L   Chloride 99 96 - 106 mmol/L   CO2 25 20 - 29 mmol/L   Calcium 9.7 8.7 - 10.2 mg/dL   Total Protein 7.2 6.0 - 8.5 g/dL   Albumin 4.7 3.8 - 4.9 g/dL   Globulin, Total 2.5 1.5 - 4.5 g/dL   Albumin/Globulin Ratio 1.9 1.2 - 2.2   Bilirubin Total 0.5 0.0 - 1.2 mg/dL   Alkaline Phosphatase 74 48 - 121 IU/L   AST 31 0 - 40 IU/L   ALT 30 0 - 44 IU/L  CBC with Differential/Platelet  Result Value Ref Range   WBC 5.8 3.4 - 10.8 x10E3/uL   RBC 5.27 4.14 - 5.80 x10E6/uL   Hemoglobin 14.2 13.0 - 17.7 g/dL   Hematocrit 44.4 37.5 - 51.0 %   MCV 84 79 - 97 fL   MCH 26.9 26.6 - 33.0 pg   MCHC 32.0 31 - 35 g/dL   RDW 14.5 11.6 - 15.4 %   Platelets 258 150 - 450 x10E3/uL   Neutrophils 50 Not Estab. %   Lymphs 38 Not Estab. %   Monocytes 7 Not Estab. %   Eos 4 Not Estab. %   Basos 1 Not Estab. %   Neutrophils Absolute 2.9 1 -  7 x10E3/uL   Lymphocytes Absolute 2.2 0 - 3 x10E3/uL   Monocytes Absolute 0.4 0 - 0  x10E3/uL   EOS (ABSOLUTE) 0.2 0.0 - 0.4 x10E3/uL   Basophils Absolute 0.1 0 - 0 x10E3/uL   Immature Granulocytes 0 Not Estab. %   Immature Grans (Abs) 0.0 0.0 - 0.1 x10E3/uL  Lipid panel  Result Value Ref Range   Cholesterol, Total 190 100 - 199 mg/dL   Triglycerides 83 0 - 149 mg/dL   HDL 51 >39 mg/dL   VLDL Cholesterol Cal 15 5 - 40 mg/dL   LDL Chol Calc (NIH) 124 (H) 0 - 99 mg/dL   Chol/HDL Ratio 3.7 0.0 - 5.0 ratio  Testosterone,Free and Total  Result Value Ref Range   Testosterone 504 264 - 916 ng/dL   Testosterone, Free 9.2 7.2 - 24.0 pg/mL  PSA Total (Reflex To Free)  Result Value Ref Range   Prostate Specific Ag, Serum 0.5 0.0 - 4.0 ng/mL   Reflex Criteria Comment      Assessment/ Plan: Raad was seen today for back pain.  Diagnoses and all orders for this visit:  Chronic bilateral low back pain without sciatica Patient declined tordal injection or PT referral today. Start Mobic and discontinue Aleve. Continue Flexeril for spasms. Patient was recently on prednisone. Referral placed to patient's pervious pain clinic for epidural injection. Patient declined work note today.  -     meloxicam (MOBIC) 15 MG tablet; Take 1 tablet (15 mg total) by mouth daily. -     Ambulatory referral to Pain Clinic  Follow up as needed.   The above assessment and management plan was discussed with the patient. The patient verbalized understanding of and has agreed to the management plan. Patient is aware to call the clinic if symptoms persist or worsen. Patient is aware when to return to the clinic for a follow-up visit. Patient educated on when it is appropriate to go to the emergency department.   Marjorie Smolder, FNP-C [facility] [address] [place], [place] [address] [phone]

## 2019-12-14 NOTE — Patient Instructions (Signed)
Acute Back Pain, Adult Acute back pain is sudden and usually short-lived. It is often caused by an injury to the muscles and tissues in the back. The injury may result from:  A muscle or ligament getting overstretched or torn (strained). Ligaments are tissues that connect bones to each other. Lifting something improperly can cause a back strain.  Wear and tear (degeneration) of the spinal disks. Spinal disks are circular tissue that provides cushioning between the bones of the spine (vertebrae).  Twisting motions, such as while playing sports or doing yard work.  A hit to the back.  Arthritis. You may have a physical exam, lab tests, and imaging tests to find the cause of your pain. Acute back pain usually goes away with rest and home care. Follow these instructions at home: Managing pain, stiffness, and swelling  Take over-the-counter and prescription medicines only as told by your health care provider.  Your health care provider may recommend applying ice during the first 24-48 hours after your pain starts. To do this: ? Put ice in a plastic bag. ? Place a towel between your skin and the bag. ? Leave the ice on for 20 minutes, 2-3 times a day.  If directed, apply heat to the affected area as often as told by your health care provider. Use the heat source that your health care provider recommends, such as a moist heat pack or a heating pad. ? Place a towel between your skin and the heat source. ? Leave the heat on for 20-30 minutes. ? Remove the heat if your skin turns bright red. This is especially important if you are unable to feel pain, heat, or cold. You have a greater risk of getting burned. Activity   Do not stay in bed. Staying in bed for more than 1-2 days can delay your recovery.  Sit up and stand up straight. Avoid leaning forward when you sit, or hunching over when you stand. ? If you work at a desk, sit close to it so you do not need to lean over. Keep your chin tucked  in. Keep your neck drawn back, and keep your elbows bent at a right angle. Your arms should look like the letter "L." ? Sit high and close to the steering wheel when you drive. Add lower back (lumbar) support to your car seat, if needed.  Take short walks on even surfaces as soon as you are able. Try to increase the length of time you walk each day.  Do not sit, drive, or stand in one place for more than 30 minutes at a time. Sitting or standing for long periods of time can put stress on your back.  Do not drive or use heavy machinery while taking prescription pain medicine.  Use proper lifting techniques. When you bend and lift, use positions that put less stress on your back: ? Bend your knees. ? Keep the load close to your body. ? Avoid twisting.  Exercise regularly as told by your health care provider. Exercising helps your back heal faster and helps prevent back injuries by keeping muscles strong and flexible.  Work with a physical therapist to make a safe exercise program, as recommended by your health care provider. Do any exercises as told by your physical therapist. Lifestyle  Maintain a healthy weight. Extra weight puts stress on your back and makes it difficult to have good posture.  Avoid activities or situations that make you feel anxious or stressed. Stress and anxiety increase muscle   tension and can make back pain worse. Learn ways to manage anxiety and stress, such as through exercise. General instructions  Sleep on a firm mattress in a comfortable position. Try lying on your side with your knees slightly bent. If you lie on your back, put a pillow under your knees.  Follow your treatment plan as told by your health care provider. This may include: ? Cognitive or behavioral therapy. ? Acupuncture or massage therapy. ? Meditation or yoga. Contact a health care provider if:  You have pain that is not relieved with rest or medicine.  You have increasing pain going down  into your legs or buttocks.  Your pain does not improve after 2 weeks.  You have pain at night.  You lose weight without trying.  You have a fever or chills. Get help right away if:  You develop new bowel or bladder control problems.  You have unusual weakness or numbness in your arms or legs.  You develop nausea or vomiting.  You develop abdominal pain.  You feel faint. Summary  Acute back pain is sudden and usually short-lived.  Use proper lifting techniques. When you bend and lift, use positions that put less stress on your back.  Take over-the-counter and prescription medicines and apply heat or ice as directed by your health care provider. This information is not intended to replace advice given to you by your health care provider. Make sure you discuss any questions you have with your health care provider. Document Revised: 06/21/2018 Document Reviewed: 10/14/2016 Elsevier Patient Education  2020 [employer]  

## 2019-12-28 ENCOUNTER — Telehealth: Payer: Self-pay | Admitting: Family Medicine

## 2019-12-28 NOTE — Telephone Encounter (Signed)
Needing to know if this is a continuous or intermittent. Pt has been out since visit on 12/14/19 and has been referred back to Dr. Elayne Snare at [facility] in [place]

## 2020-01-08 NOTE — Telephone Encounter (Signed)
Forms completed on 01/03/20

## 2020-03-19 ENCOUNTER — Other Ambulatory Visit: Payer: Self-pay | Admitting: Family Medicine

## 2020-03-19 DIAGNOSIS — E782 Mixed hyperlipidemia: Secondary | ICD-10-CM

## 2020-04-13 ENCOUNTER — Other Ambulatory Visit: Payer: Self-pay | Admitting: Family Medicine

## 2020-04-13 DIAGNOSIS — E782 Mixed hyperlipidemia: Secondary | ICD-10-CM

## 2020-04-15 NOTE — Telephone Encounter (Signed)
Stacks. NTBS 30 days given 03/19/20 

## 2020-05-06 ENCOUNTER — Other Ambulatory Visit: Payer: Self-pay | Admitting: Family Medicine

## 2020-05-06 DIAGNOSIS — E782 Mixed hyperlipidemia: Secondary | ICD-10-CM

## 2020-05-06 NOTE — Telephone Encounter (Signed)
Stacks NTBSf 30 days given 03/19/20

## 2020-05-07 MED ORDER — ATORVASTATIN CALCIUM 40 MG PO TABS: 40.0000 mg | ORAL_TABLET | Freq: Every day | ORAL | 0 refills | Status: DC

## 2020-05-07 NOTE — Addendum Note (Signed)
Addended by: Magdalene River on: 05/07/2020 11:43 AM   Modules accepted: Orders

## 2020-05-09 ENCOUNTER — Encounter: Payer: Self-pay | Admitting: Family Medicine

## 2020-05-09 ENCOUNTER — Other Ambulatory Visit: Payer: Self-pay

## 2020-05-09 ENCOUNTER — Ambulatory Visit (INDEPENDENT_AMBULATORY_CARE_PROVIDER_SITE_OTHER): Payer: Managed Care, Other (non HMO) | Admitting: Family Medicine

## 2020-05-09 VITALS — BP 123/78 | HR 84 | Temp 97.1°F | Resp 20 | Ht 73.0 in | Wt 244.5 lb

## 2020-05-09 DIAGNOSIS — M792 Neuralgia and neuritis, unspecified: Secondary | ICD-10-CM

## 2020-05-09 DIAGNOSIS — N5203 Combined arterial insufficiency and corporo-venous occlusive erectile dysfunction: Secondary | ICD-10-CM | POA: Diagnosis not present

## 2020-05-09 DIAGNOSIS — M5416 Radiculopathy, lumbar region: Secondary | ICD-10-CM

## 2020-05-09 DIAGNOSIS — E782 Mixed hyperlipidemia: Secondary | ICD-10-CM | POA: Diagnosis not present

## 2020-05-09 MED ORDER — ATORVASTATIN CALCIUM 40 MG PO TABS
40.0000 mg | ORAL_TABLET | Freq: Every day | ORAL | 1 refills | Status: DC
Start: 2020-05-09 — End: 2020-07-08

## 2020-05-09 MED ORDER — SILDENAFIL CITRATE 100 MG PO TABS: 50.0000 mg | ORAL_TABLET | Freq: Every day | ORAL | 1 refills | Status: DC | PRN

## 2020-05-09 MED ORDER — PREGABALIN 50 MG PO CAPS: ORAL_CAPSULE | ORAL | 0 refills | Status: DC

## 2020-05-09 NOTE — Progress Notes (Addendum)
Subjective:  Patient ID: Ryan Rojas, male    DOB: Nov 16, 1965  Age: 55 y.o. MRN: [id number]  CC: Medical Management of Chronic Issues   HPI Ryan Rojas presents for follow-up of elevated cholesterol. Doing well without complaints on current medication. Denies side effects of statin including myalgia and arthralgia and nausea. Also in today for liver function testing. Currently no chest pain, shortness of breath or other cardiovascular related symptoms noted.  Went out of atorvastatin about a week ago.  Patient is also followed for erectile dysfunction.  He continues sildenafil just needs refills today.  No concerns.  Patient's main concern today is his chronic left lumbar pain.  He has recently had multiple epidural steroid injections without any relief.  He is very frustrated by the pain he is dealing with without any relief.  He points to the L5-S1 region on the left as the beginning of the pain but where it really hurts the most is running down the posterior lateral left thigh.  History Ryan Rojas has a past medical history of Hyperlipidemia.   He has no past surgical history on file.   His family history includes Asthma in his mother and sister; Diabetes in his sister.He reports that he has never smoked. He has never used smokeless tobacco. He reports current alcohol use of about 1.0 standard drink of alcohol per week. He reports that he does not use drugs.  No current outpatient medications on file prior to visit.   No current facility-administered medications on file prior to visit.    ROS Review of Systems  Constitutional: Negative for fever.  Respiratory: Negative for shortness of breath.   Cardiovascular: Negative for chest pain.  Musculoskeletal: Positive for arthralgias and back pain.  Skin: Negative for rash.    Objective:  BP 123/78   Pulse 84   Temp (!) 97.1 F (36.2 C) (Temporal)   Resp 20   Ht 6\' 1"  (1.854 m)   Wt 244 lb 8 oz (110.9 kg)   SpO2 97%   BMI 32.26  kg/m   BP Readings from Last 3 Encounters:  05/09/20 123/78  12/14/19 126/83  09/27/19 139/89    Wt Readings from Last 3 Encounters:  05/09/20 244 lb 8 oz (110.9 kg)  12/14/19 237 lb (107.5 kg)  09/27/19 238 lb (108 kg)     Physical Exam Vitals reviewed.  Constitutional:      General: He is not in acute distress.    Appearance: He is well-developed and well-nourished.  HENT:     Head: Normocephalic.  Eyes:     Pupils: Pupils are equal, round, and reactive to light.  Cardiovascular:     Rate and Rhythm: Normal rate and regular rhythm.     Heart sounds: Normal heart sounds. No murmur heard.   Pulmonary:     Effort: Pulmonary effort is normal.     Breath sounds: Normal breath sounds.  Abdominal:     Tenderness: There is no abdominal tenderness.  Musculoskeletal:        General: Tenderness present.     Cervical back: Normal range of motion.  Skin:    General: Skin is warm and dry.  Neurological:     Mental Status: He is alert and oriented to person, place, and time.     Deep Tendon Reflexes: Reflexes are normal and symmetric.  Psychiatric:        Behavior: Behavior normal.        Thought Content: Thought content normal.  No results found for: HGBA1C  Lab Results  Component Value Date   WBC 5.8 09/27/2019   HGB 14.2 09/27/2019   HCT 44.4 09/27/2019   PLT 258 09/27/2019   GLUCOSE 98 09/27/2019   CHOL 190 09/27/2019   TRIG 83 09/27/2019   HDL 51 09/27/2019   LDLCALC 124 (H) 09/27/2019   ALT 30 09/27/2019   AST 31 09/27/2019   NA 141 09/27/2019   K 4.2 09/27/2019   CL 99 09/27/2019   CREATININE 1.09 09/27/2019   BUN 13 09/27/2019   CO2 25 09/27/2019    Patient was never admitted.  Assessment & Plan:   Ryan Rojas was seen today for medical management of chronic issues.  Diagnoses and all orders for this visit:  Intractable neuropathic pain of lumbosacral origin  Combined arterial insufficiency and corporo-venous occlusive erectile dysfunction -      sildenafil (VIAGRA) 100 MG tablet; Take 0.5-1 tablets (50-100 mg total) by mouth daily as needed for erectile dysfunction.  Mixed hyperlipidemia -     atorvastatin (LIPITOR) 40 MG tablet; Take 1 tablet (40 mg total) by mouth daily.  Left lumbar radiculopathy -     pregabalin (LYRICA) 50 MG capsule; 1 qhs X7 days , then 2 qhs X 7d, then 3 qhs X 7d, then 4 qhs   I have discontinued Ryan Rojas's meloxicam. I have also changed his atorvastatin. Additionally, I am having him start on pregabalin. Lastly, I am having him maintain his sildenafil.  Meds ordered this encounter  Medications  . sildenafil (VIAGRA) 100 MG tablet    Sig: Take 0.5-1 tablets (50-100 mg total) by mouth daily as needed for erectile dysfunction.    Dispense:  24 tablet    Refill:  1  . atorvastatin (LIPITOR) 40 MG tablet    Sig: Take 1 tablet (40 mg total) by mouth daily.    Dispense:  90 tablet    Refill:  1  . pregabalin (LYRICA) 50 MG capsule    Sig: 1 qhs X7 days , then 2 qhs X 7d, then 3 qhs X 7d, then 4 qhs    Dispense:  120 capsule    Refill:  0     Follow-up: Return in about 1 month (around 06/06/2020).  Mechele Claude, M.D.

## 2020-05-10 ENCOUNTER — Telehealth: Payer: Self-pay | Admitting: *Deleted

## 2020-05-10 DIAGNOSIS — M792 Neuralgia and neuritis, unspecified: Secondary | ICD-10-CM

## 2020-05-10 DIAGNOSIS — M5416 Radiculopathy, lumbar region: Secondary | ICD-10-CM

## 2020-05-10 NOTE — Telephone Encounter (Signed)
(  Key: [id number]) Rx #: [id number] Pregabalin 50MG  capsules  Pa started  Sent to plan

## 2020-05-20 ENCOUNTER — Encounter: Payer: Self-pay | Admitting: Family Medicine

## 2020-05-20 DIAGNOSIS — M792 Neuralgia and neuritis, unspecified: Secondary | ICD-10-CM | POA: Insufficient documentation

## 2020-05-20 NOTE — Telephone Encounter (Signed)
PA for pregabalin denied. Patient must meet one of the following conditions.   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

## 2020-05-20 NOTE — Telephone Encounter (Signed)
His lumbar radiculopathy does cause neuropathic pain from the spine.  I added that to his last visit.  Please resubmit the prior authorization with that code.  I hope that helps.  Thanks, Ryan Rojas

## 2020-05-21 NOTE — Telephone Encounter (Signed)
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

## 2020-05-21 NOTE — Telephone Encounter (Signed)
Your PA request has been approved [facility] aware

## 2020-06-06 ENCOUNTER — Other Ambulatory Visit: Payer: Self-pay

## 2020-06-06 ENCOUNTER — Encounter: Payer: Self-pay | Admitting: Family Medicine

## 2020-06-06 ENCOUNTER — Ambulatory Visit (INDEPENDENT_AMBULATORY_CARE_PROVIDER_SITE_OTHER): Payer: Managed Care, Other (non HMO) | Admitting: Family Medicine

## 2020-06-06 ENCOUNTER — Ambulatory Visit: Payer: Managed Care, Other (non HMO) | Admitting: Family Medicine

## 2020-06-06 VITALS — BP 127/74 | HR 97 | Temp 98.1°F | Ht 73.0 in | Wt 239.2 lb

## 2020-06-06 DIAGNOSIS — M792 Neuralgia and neuritis, unspecified: Secondary | ICD-10-CM | POA: Diagnosis not present

## 2020-06-06 NOTE — Progress Notes (Signed)
Subjective:  Patient ID: Wolfgang Finigan, male    DOB: 14-Apr-1965  Age: 55 y.o. MRN: [id number]  CC: Follow-up   HPI Lanis Storlie presents for back pain. Feels pain  if he stands for 12 minutes. 2/10 at first, but will spike if he doesn't sit down. Gets stiff after sitting for a while. Afraid that exercise will make pain worse. HE wasn't able to get the Lyrica until about a week ago because of prior authorization issues.  He has been taking for a week has not helped yet.  However he is tolerating it well with minimal morning drowsiness being the only side effect.  He has the medicine and plans on continued use it.  Depression screen [facility] 2/9 06/06/2020 05/09/2020 12/14/2019  Decreased Interest 0 0 0  Down, Depressed, Hopeless 0 0 0  PHQ - 2 Score 0 0 0    History [place] has a past medical history of Hyperlipidemia.   He has no past surgical history on file.   His family history includes Asthma in his mother and sister; Diabetes in his sister.He reports that he has never smoked. He has never used smokeless tobacco. He reports current alcohol use of about 1.0 standard drink of alcohol per week. He reports that he does not use drugs.    ROS Review of Systems  Constitutional: Negative for activity change.       AM drowsiness  Musculoskeletal: Positive for back pain.    Objective:  BP 127/74   Pulse 97   Temp 98.1 F (36.7 C)   Ht 6\' 1"  (1.854 m)   Wt 239 lb 3.2 oz (108.5 kg)   SpO2 94%   BMI 31.56 kg/m   BP Readings from Last 3 Encounters:  06/06/20 127/74  05/09/20 123/78  12/14/19 126/83    Wt Readings from Last 3 Encounters:  06/06/20 239 lb 3.2 oz (108.5 kg)  05/09/20 244 lb 8 oz (110.9 kg)  12/14/19 237 lb (107.5 kg)     Physical Exam Vitals reviewed.  Constitutional:      Appearance: He is well-developed.  HENT:     Head: Normocephalic and atraumatic.     Right Ear: External ear normal.     Left Ear: External ear normal.     Mouth/Throat:     Pharynx: No  oropharyngeal exudate or posterior oropharyngeal erythema.  Eyes:     Pupils: Pupils are equal, round, and reactive to light.  Cardiovascular:     Rate and Rhythm: Normal rate and regular rhythm.     Heart sounds: No murmur heard.   Pulmonary:     Effort: No respiratory distress.     Breath sounds: Normal breath sounds.  Musculoskeletal:     Cervical back: Normal range of motion and neck supple.  Neurological:     Mental Status: He is alert and oriented to person, place, and time.       Assessment & Plan:   [place] was seen today for follow-up.  Diagnoses and all orders for this visit:  Intractable neuropathic pain of lumbosacral origin   Mr. Woodham just needs to continue the induction.  It seems to be going well.  Just have not gotten far enough into it to tell about efficacy for him.  He will follow up in 4 weeks.    I am having Hyacinth Meeker maintain his sildenafil, atorvastatin, and pregabalin.  Allergies as of 06/06/2020      Reactions   Hydrocodone Itching  Medication List       Accurate as of June 06, 2020  4:34 PM. If you have any questions, ask your nurse or doctor.        atorvastatin 40 MG tablet Commonly known as: LIPITOR Take 1 tablet (40 mg total) by mouth daily.   pregabalin 50 MG capsule Commonly known as: Lyrica 1 qhs X7 days , then 2 qhs X 7d, then 3 qhs X 7d, then 4 qhs   sildenafil 100 MG tablet Commonly known as: Viagra Take 0.5-1 tablets (50-100 mg total) by mouth daily as needed for erectile dysfunction.        Follow-up: Return in about 1 month (around 07/07/2020).  Mechele Claude, M.D.

## 2020-07-06 ENCOUNTER — Other Ambulatory Visit: Payer: Self-pay | Admitting: Family Medicine

## 2020-07-06 DIAGNOSIS — M5416 Radiculopathy, lumbar region: Secondary | ICD-10-CM

## 2020-07-08 ENCOUNTER — Ambulatory Visit (INDEPENDENT_AMBULATORY_CARE_PROVIDER_SITE_OTHER): Payer: Managed Care, Other (non HMO) | Admitting: Family Medicine

## 2020-07-08 ENCOUNTER — Telehealth: Payer: Self-pay

## 2020-07-08 ENCOUNTER — Encounter: Payer: Self-pay | Admitting: Family Medicine

## 2020-07-08 ENCOUNTER — Other Ambulatory Visit: Payer: Self-pay

## 2020-07-08 VITALS — BP 124/67 | HR 93 | Temp 98.2°F | Ht 73.0 in | Wt 241.6 lb

## 2020-07-08 DIAGNOSIS — E782 Mixed hyperlipidemia: Secondary | ICD-10-CM

## 2020-07-08 DIAGNOSIS — N5203 Combined arterial insufficiency and corporo-venous occlusive erectile dysfunction: Secondary | ICD-10-CM | POA: Diagnosis not present

## 2020-07-08 DIAGNOSIS — M792 Neuralgia and neuritis, unspecified: Secondary | ICD-10-CM

## 2020-07-08 MED ORDER — SILDENAFIL CITRATE 100 MG PO TABS: 50.0000 mg | ORAL_TABLET | Freq: Every day | ORAL | 1 refills | Status: DC | PRN

## 2020-07-08 MED ORDER — ATORVASTATIN CALCIUM 40 MG PO TABS: 40.0000 mg | ORAL_TABLET | Freq: Every day | ORAL | 1 refills | Status: DC

## 2020-07-08 NOTE — Progress Notes (Signed)
Subjective:  Patient ID: Ryan Rojas, male    DOB: 25-Sep-1965  Age: 55 y.o. MRN: [id number]  CC: Follow-up (Back pain)   HPI Ryan Rojas presents for back pain down to 1/10. Limiting activities. Feels hard to get up for work in the morning since starting the medication.  Depression screen [facility] 2/9 07/08/2020 06/06/2020 05/09/2020  Decreased Interest 0 0 0  Down, Depressed, Hopeless 0 0 0  PHQ - 2 Score 0 0 0    History Ryan Rojas has a past medical history of Hyperlipidemia.   He has no past surgical history on file.   His family history includes Asthma in his mother and sister; Diabetes in his sister.He reports that he has never smoked. He has never used smokeless tobacco. He reports current alcohol use of about 1.0 standard drink of alcohol per week. He reports that he does not use drugs.    ROS Review of Systems  Constitutional: Negative for fatigue and fever.  Respiratory: Negative for shortness of breath.   Cardiovascular: Negative for chest pain.  Musculoskeletal: Negative for arthralgias.  Skin: Negative for rash.    Objective:  BP 124/67   Pulse 93   Temp 98.2 F (36.8 C)   Ht 6\' 1"  (1.854 m)   Wt 241 lb 9.6 oz (109.6 kg)   SpO2 97%   BMI 31.88 kg/m   BP Readings from Last 3 Encounters:  07/08/20 124/67  06/06/20 127/74  05/09/20 123/78    Wt Readings from Last 3 Encounters:  07/08/20 241 lb 9.6 oz (109.6 kg)  06/06/20 239 lb 3.2 oz (108.5 kg)  05/09/20 244 lb 8 oz (110.9 kg)     Physical Exam Vitals reviewed.  Constitutional:      Appearance: He is well-developed.  HENT:     Head: Normocephalic and atraumatic.     Right Ear: External ear normal.     Left Ear: External ear normal.     Mouth/Throat:     Pharynx: No oropharyngeal exudate or posterior oropharyngeal erythema.  Eyes:     Pupils: Pupils are equal, round, and reactive to light.  Cardiovascular:     Rate and Rhythm: Normal rate and regular rhythm.     Heart sounds: No murmur  heard.   Pulmonary:     Effort: No respiratory distress.     Breath sounds: Normal breath sounds.  Musculoskeletal:     Cervical back: Normal range of motion and neck supple.  Neurological:     Mental Status: He is alert and oriented to person, place, and time.       Assessment & Plan:   Ryan Rojas was seen today for follow-up.  Diagnoses and all orders for this visit:  Intractable neuropathic pain of lumbosacral origin  Mixed hyperlipidemia -     atorvastatin (LIPITOR) 40 MG tablet; Take 1 tablet (40 mg total) by mouth daily.  Combined arterial insufficiency and corporo-venous occlusive erectile dysfunction -     sildenafil (VIAGRA) 100 MG tablet; Take 0.5-1 tablets (50-100 mg total) by mouth daily as needed for erectile dysfunction.       I am having Ryan Rojas maintain his atorvastatin and sildenafil.  Allergies as of 07/08/2020      Reactions   Hydrocodone Itching      Medication List       Accurate as of July 08, 2020  4:24 PM. If you have any questions, ask your nurse or doctor.        atorvastatin 40 MG  tablet Commonly known as: LIPITOR Take 1 tablet (40 mg total) by mouth daily.   pregabalin 200 MG capsule Commonly known as: LYRICA Take 1 capsule (200 mg total) by mouth daily.   sildenafil 100 MG tablet Commonly known as: Viagra Take 0.5-1 tablets (50-100 mg total) by mouth daily as needed for erectile dysfunction.        Follow-up: Return in about 3 months (around 10/07/2020).  Mechele Claude, M.D.

## 2020-08-08 ENCOUNTER — Ambulatory Visit (INDEPENDENT_AMBULATORY_CARE_PROVIDER_SITE_OTHER): Payer: Managed Care, Other (non HMO) | Admitting: Family Medicine

## 2020-08-08 ENCOUNTER — Encounter: Payer: Self-pay | Admitting: Family Medicine

## 2020-08-08 ENCOUNTER — Other Ambulatory Visit: Payer: Self-pay

## 2020-08-08 VITALS — BP 127/82 | HR 75 | Temp 97.4°F | Ht 73.0 in | Wt 233.4 lb

## 2020-08-08 DIAGNOSIS — G5622 Lesion of ulnar nerve, left upper limb: Secondary | ICD-10-CM

## 2020-08-08 MED ORDER — PREDNISONE 10 MG PO TABS: ORAL_TABLET | ORAL | 0 refills | Status: DC

## 2020-08-08 NOTE — Progress Notes (Signed)
Subjective:  Patient ID: Ryan Rojas, male    DOB: February 29, 1966  Age: 55 y.o. MRN: [id number]  CC: Arm Pain (LEFT) and Hand Pain (HAND/)   HPI Ryan Rojas presents for shooting pains from left  elbow to wrist and into the hand at the thenar eminence. LEft hand weak for grip. Pain is mild. Worse with resting forearm on an armrest. Some pain at tficeps.   Depression screen [facility] 2/9 08/08/2020 08/08/2020 07/08/2020  Decreased Interest 0 0 0  Down, Depressed, Hopeless 0 0 0  PHQ - 2 Score 0 0 0  Altered sleeping 1 - -  Tired, decreased energy 1 - -  Change in appetite 0 - -  Feeling bad or failure about yourself  0 - -  Trouble concentrating 0 - -  Moving slowly or fidgety/restless 0 - -  Suicidal thoughts 0 - -  PHQ-9 Score 2 - -  Difficult doing work/chores Not difficult at all - -    History Ryan Rojas has a past medical history of Hyperlipidemia.   Ryan Rojas has no past surgical history on file.   His family history includes Asthma in his mother and sister; Diabetes in his sister.Ryan Rojas reports that Ryan Rojas has never smoked. Ryan Rojas has never used smokeless tobacco. Ryan Rojas reports current alcohol use of about 1.0 standard drink of alcohol per week. Ryan Rojas reports that Ryan Rojas does not use drugs.    ROS Review of Systems  Constitutional: Positive for activity change. Negative for fever.  Musculoskeletal: Positive for arthralgias and back pain.  Neurological: Positive for weakness (LUE).    Objective:  BP 127/82   Pulse 75   Temp (!) 97.4 F (36.3 C)   Ht 6\' 1"  (1.854 m)   Wt 233 lb 6.4 oz (105.9 kg)   SpO2 96%   BMI 30.79 kg/m   BP Readings from Last 3 Encounters:  08/08/20 127/82  07/08/20 124/67  06/06/20 127/74    Wt Readings from Last 3 Encounters:  08/08/20 233 lb 6.4 oz (105.9 kg)  07/08/20 241 lb 9.6 oz (109.6 kg)  06/06/20 239 lb 3.2 oz (108.5 kg)     Physical Exam Cardiovascular:     Rate and Rhythm: Normal rate and regular rhythm.  Pulmonary:     Breath sounds: Normal breath  sounds.  Musculoskeletal:        General: Tenderness (left thenar eminence, left ulnar grove) present.  Neurological:     Motor: Weakness (grip, left hand) present.       Assessment & Plan:   Ryan Rojas was seen today for arm pain and hand pain.  Diagnoses and all orders for this visit:  Ulnar nerve entrapment at elbow, left -     Ambulatory referral to Orthopedics -     Ambulatory referral to Physical Therapy  Other orders -     predniSONE (DELTASONE) 10 MG tablet; Take 5 daily for 3 days followed by 4,3,2 and 1 for 3 days each.       I am having Ryan Rojas start on predniSONE. I am also having him maintain his pregabalin, atorvastatin, and sildenafil. Keep   Allergies as of 08/08/2020      Reactions   Hydrocodone Itching      Medication List       Accurate as of Aug 08, 2020  6:34 PM. If you have any questions, ask your nurse or doctor.        atorvastatin 40 MG tablet Commonly known as: LIPITOR Take 1 tablet (40  mg total) by mouth daily.   predniSONE 10 MG tablet Commonly known as: DELTASONE Take 5 daily for 3 days followed by 4,3,2 and 1 for 3 days each. Started by: Mechele Claude, MD   pregabalin 200 MG capsule Commonly known as: LYRICA Take 1 capsule (200 mg total) by mouth daily.   sildenafil 100 MG tablet Commonly known as: Viagra Take 0.5-1 tablets (50-100 mg total) by mouth daily as needed for erectile dysfunction.        Follow-up: Return if symptoms worsen or fail to improve.  Mechele Claude, M.D.

## 2020-08-16 ENCOUNTER — Ambulatory Visit (INDEPENDENT_AMBULATORY_CARE_PROVIDER_SITE_OTHER): Payer: Managed Care, Other (non HMO) | Admitting: Orthopaedic Surgery

## 2020-08-16 ENCOUNTER — Encounter: Payer: Self-pay | Admitting: Orthopaedic Surgery

## 2020-08-16 ENCOUNTER — Other Ambulatory Visit: Payer: Self-pay

## 2020-08-16 ENCOUNTER — Ambulatory Visit (INDEPENDENT_AMBULATORY_CARE_PROVIDER_SITE_OTHER): Payer: Managed Care, Other (non HMO)

## 2020-08-16 VITALS — Ht 73.0 in | Wt 233.0 lb

## 2020-08-16 DIAGNOSIS — G5622 Lesion of ulnar nerve, left upper limb: Secondary | ICD-10-CM

## 2020-08-16 MED ORDER — DIAZEPAM 5 MG PO TABS: 5.0000 mg | ORAL_TABLET | Freq: Once | ORAL | 0 refills | Status: AC

## 2020-08-16 NOTE — Progress Notes (Signed)
Office Visit Note   Patient: Ryan Rojas           Date of Birth: Sep 30, 1965           MRN: [id number] Visit Date: 08/16/2020              Requested by: Mechele Claude, MD [address] [place],  [place] [address] PCP: Mechele Claude, MD   Assessment & Plan: Visit Diagnoses:  1. Cubital tunnel syndrome on left     Plan: Impression is left cubital tunnel syndrome.  I do not feel that this is a cervical radiculopathy.  We will need to obtain nerve conduction studies to assess the severity of the cubital tunnel syndrome.  He has been instructed to avoid flexing the elbow.  He is to discontinue the shoulder sling.  I sent a prescription for Valium to help with the nerve conduction study.  We will see him back after the nerve conduction study.  Follow-Up Instructions: Return for after NCS/EMG.   Orders:  Orders Placed This Encounter  Procedures  . XR Elbow Complete Left (3+View)   Meds ordered this encounter  Medications  . diazepam (VALIUM) 5 MG tablet    Sig: Take 1-2 tablets (5-10 mg total) by mouth once for 1 dose.    Dispense:  2 tablet    Refill:  0      Procedures: No procedures performed   Clinical Data: No additional findings.   Subjective: Chief Complaint  Patient presents with  . Left Elbow - Pain    Ryan Rojas is a 55 year old gentleman left-hand-dominant comes in for evaluation of 2 to 3 weeks of acute onset of numbness and tingling radiate down the ulnar side of the forearm from the elbow.  He has noticed that the symptoms are worse when he is resting his elbow on the desk or when his elbow is bent.  Straining the elbow helps with the symptoms.  He has noticed a significant decrease in his grip strength and he cannot hold a pen.  He was recently given Dosepak which has helped significantly.  He denies a history of gout and he takes Lyrica for chronic back pain.   Review of Systems  Constitutional: Negative.   All other systems reviewed and are  negative.    Objective: Vital Signs: Ht 6\' 1"  (1.854 m)   Wt 233 lb (105.7 kg)   BMI 30.74 kg/m   Physical Exam Vitals and nursing note reviewed.  Constitutional:      Appearance: He is well-developed.  HENT:     Head: Normocephalic and atraumatic.  Eyes:     Pupils: Pupils are equal, round, and reactive to light.  Pulmonary:     Effort: Pulmonary effort is normal.  Abdominal:     Palpations: Abdomen is soft.  Musculoskeletal:        General: Normal range of motion.     Cervical back: Neck supple.  Skin:    General: Skin is warm.  Neurological:     Mental Status: He is alert and oriented to person, place, and time.  Psychiatric:        Behavior: Behavior normal.        Thought Content: Thought content normal.        Judgment: Judgment normal.     Ortho Exam Left upper extremity exam shows normal range of motion of the elbow.  Negative Tinel at the cubital tunnel.  Ulnar nerve is stable and not subluxating.  Positive Froment's  and atrophy of the first dorsal interosseous muscle.  Negative Wartenberg's sign.  No changes in sensation ulnar nerve distribution.  Cervical spine is nontender.  Negative Spurling's.  Normal strength and sensation to C5, C6, C7, C8, T1. Specialty Comments:  No specialty comments available.  Imaging: No results found.   PMFS History: Patient Active Problem List   Diagnosis Date Noted  . Cubital tunnel syndrome on left 08/16/2020  . Intractable neuropathic pain of lumbosacral origin 05/20/2020  . Combined arterial insufficiency and corporo-venous occlusive erectile dysfunction 09/27/2019  . 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  . Impotence 01/18/2018  . Hyperlipidemia 07/16/2017   Past Medical History:  Diagnosis Date  . Hyperlipidemia     Family History  Problem Relation Age of Onset  . Asthma Mother   . Diabetes Sister   . Asthma Sister     History reviewed. No pertinent surgical history. Social History   Occupational History  . Not  on file  Tobacco Use  . Smoking status: Never Smoker  . Smokeless tobacco: Never Used  Vaping Use  . Vaping Use: Never used  Substance and Sexual Activity  . Alcohol use: Yes    Alcohol/week: 1.0 standard drink    Types: 1 Cans of beer per week  . Drug use: No  . Sexual activity: Yes    Birth control/protection: None

## 2020-08-22 ENCOUNTER — Other Ambulatory Visit: Payer: Self-pay

## 2020-08-22 ENCOUNTER — Telehealth: Payer: Self-pay | Admitting: Orthopaedic Surgery

## 2020-08-22 DIAGNOSIS — G5622 Lesion of ulnar nerve, left upper limb: Secondary | ICD-10-CM

## 2020-08-22 NOTE — Telephone Encounter (Signed)
Pt called back regarding message below.  There is no referral in pts chart I also spoke with [facility] regarding pt and she also doesn't have information on him.   Please advise

## 2020-08-22 NOTE — Telephone Encounter (Signed)
I see no referral for NCS/EMG. This must of been missed. I did put a referral per last note it said we will need to obtain nerve conduction studies to assess the severity of the cubital tunnel syndrome. Marked it Urgent.

## 2020-08-22 NOTE — Telephone Encounter (Signed)
Pt called in reference to the nerve conduction study that was discussed at the last appt with Dr. Roda Shutters. He was told he would be contacted about that this week and have a follow up appt after but did not know what if anything he needed to do to get the study scheduled. The best call back is [phone].

## 2020-08-26 ENCOUNTER — Telehealth: Payer: Self-pay

## 2020-08-26 NOTE — Telephone Encounter (Signed)
Patient called he is ready to schedule his appointment with Dr.Newton call back:[phone]

## 2020-08-29 ENCOUNTER — Ambulatory Visit (INDEPENDENT_AMBULATORY_CARE_PROVIDER_SITE_OTHER): Payer: Managed Care, Other (non HMO) | Admitting: Physical Medicine and Rehabilitation

## 2020-08-29 ENCOUNTER — Other Ambulatory Visit: Payer: Self-pay

## 2020-08-29 ENCOUNTER — Encounter: Payer: Self-pay | Admitting: Physical Medicine and Rehabilitation

## 2020-08-29 DIAGNOSIS — R202 Paresthesia of skin: Secondary | ICD-10-CM | POA: Diagnosis not present

## 2020-08-29 NOTE — Procedures (Signed)
EMG & NCV Findings: Evaluation of the left median motor nerve showed prolonged distal onset latency (4.5 ms), reduced amplitude (2.3 mV), and decreased conduction velocity (Elbow-Wrist, 49 m/s).  The left ulnar motor nerve showed reduced amplitude (2.3 mV).  The left median (across palm) sensory nerve showed no response (Palm) and prolonged distal peak latency (4.2 ms).  All remaining nerves (as indicated in the following tables) were within normal limits.    Needle evaluation of the left abductor pollicis brevis and the left flexor digitorum profundus muscles showed increased insertional activity.  The left first dorsal interosseous muscle showed increased insertional activity and diminished recruitment.  The left Ext Digitorum muscle showed increased insertional activity, slightly increased spontaneous activity, and diminished recruitment.  The left triceps muscle showed diminished recruitment.  All remaining muscles (as indicated in the following table) showed no evidence of electrical instability.    Impression: The above electrodiagnostic study is somewhat difficult to interpret but is ABNORMAL and reveals evidence of most likely severe subacute on chronic C8 radiculopathy on the left but cannot rule out the possibility of a left-sided Parsonage-Turner syndrome or neurogenic amyotrophy.   There is also evidence of a moderate left median nerve entrapment at the wrist (carpal tunnel syndrome) affecting sensory and motor components.    There is no significant electrodiagnostic evidence of any other focal nerve entrapment, specifically ulnar nerve neuropathy or cubital tunnel issues.  Recommendations: 1.  Follow-up with referring physician. 2.  Continue current management of symptoms. 3.  Suggest MRI of the cervical spine versus shoulder/brachial plexus.  ___________________________ Naaman Plummer FAAPMR Board Certified, American Board of Physical Medicine and Rehabilitation    Nerve Conduction  Studies Anti Sensory Summary Table   Stim Site NR Peak (ms) Norm Peak (ms) P-T Amp (V) Norm P-T Amp Site1 Site2 Delta-P (ms) Dist (cm) Vel (m/s) Norm Vel (m/s)  Left Median Acr Palm Anti Sensory (2nd Digit)  31.6C  Wrist    *4.2 <3.6 13.6 >10 Wrist Palm  0.0    Palm *NR  <2.0          Left Radial Anti Sensory (Base 1st Digit)  30.4C  Wrist    2.3 <3.1 24.2  Wrist Base 1st Digit 2.3 0.0    Left Ulnar Anti Sensory (5th Digit)  32C  Wrist    3.7 <3.7 16.7 >15.0 Wrist 5th Digit 3.7 14.0 38 >38   Motor Summary Table   Stim Site NR Onset (ms) Norm Onset (ms) O-P Amp (mV) Norm O-P Amp Site1 Site2 Delta-0 (ms) Dist (cm) Vel (m/s) Norm Vel (m/s)  Left Median Motor (Abd Poll Brev)  31.3C  Wrist    *4.5 <4.2 *2.3 >5 Elbow Wrist 5.5 27.0 *49 >50  Elbow    10.0  1.6         Left Ulnar Motor (Abd Dig Min)  31.3C  Wrist    3.7 <4.2 *2.3 >3 B Elbow Wrist 4.7 25.0 53 >53  B Elbow    8.4  3.7  A Elbow B Elbow 1.7 10.0 59 >53  A Elbow    10.1  3.7          EMG   Side Muscle Nerve Root Ins Act Fibs Psw Amp Dur Poly Recrt Int Dennie Bible Comment  Left Abd Poll Brev Median C8-T1 *Incr Nml Nml Nml Nml 0 Nml Nml   Left 1stDorInt Ulnar C8-T1 *Incr Nml Nml Nml Nml 0 *Reduced Nml   Left PronatorTeres Median C6-7 Nml Nml Nml Nml  Nml 0 Nml Nml   Left Biceps Musculocut C5-6 Nml Nml Nml Nml Nml 0 Nml Nml   Left Deltoid Axillary C5-6 Nml Nml Nml Nml Nml 0 Nml Nml   Left Ext Digitorum  Radial (Post Int) C7-8 *Incr *1+ *1+ Nml Nml 0 *Reduced Nml   Left FlexDigProf Ulnar C8,T1 *Incr Nml Nml Nml Nml 0 Nml Nml   Left Triceps Radial C6-7-8 Nml Nml Nml Nml Nml 0 *Reduced Nml     Nerve Conduction Studies Anti Sensory Left/Right Comparison   Stim Site L Lat (ms) R Lat (ms) L-R Lat (ms) L Amp (V) R Amp (V) L-R Amp (%) Site1 Site2 L Vel (m/s) R Vel (m/s) L-R Vel (m/s)  Median Acr Palm Anti Sensory (2nd Digit)  31.6C  Wrist *4.2   13.6   Wrist Palm     Palm             Radial Anti Sensory (Base 1st Digit)  30.4C   Wrist 2.3   24.2   Wrist Base 1st Digit     Ulnar Anti Sensory (5th Digit)  32C  Wrist 3.7   16.7   Wrist 5th Digit 38     Motor Left/Right Comparison   Stim Site L Lat (ms) R Lat (ms) L-R Lat (ms) L Amp (mV) R Amp (mV) L-R Amp (%) Site1 Site2 L Vel (m/s) R Vel (m/s) L-R Vel (m/s)  Median Motor (Abd Poll Brev)  31.3C  Wrist *4.5   *2.3   Elbow Wrist *49    Elbow 10.0   1.6         Ulnar Motor (Abd Dig Min)  31.3C  Wrist 3.7   *2.3   B Elbow Wrist 53    B Elbow 8.4   3.7   A Elbow B Elbow 59    A Elbow 10.1   3.7            Waveforms:

## 2020-08-29 NOTE — Progress Notes (Signed)
Ryan Rojas - 55 y.o. male MRN [id number]  Date of birth: 1965-06-05  Office Visit Note: Visit Date: 08/29/2020 PCP: Mechele Claude, MD Referred by: Mechele Claude, MD  Subjective: Chief Complaint  Patient presents with   Left Hand - Weakness, Numbness, Pain   Left Arm - Pain   Left Shoulder - Pain   HPI:  Ryan Rojas is a 55 y.o. male who comes in today At the request of Dr. Glee Arvin for electrodiagnostic study of the left upper limb.  He is right-hand dominant.  He reports initial symptoms in late May.  He saw his primary care physician and was started on a Medrol Dosepak after having shooting pain as described by the primary care physician from the elbow down the ulnar side of the forearm to the ulnar side of the hand.  Dr. Roda Shutters reports numbness and tingling in the same distribution mostly with symptoms increasing with the patient placing his elbow or forearm on the table or arm of the chair.  Patient today endorses more of what he calls a "streaming "pain from the elbow and forearm down to the ulnar side.  He reports some decrease in pain symptoms but still significant symptoms.  It is somewhat hard for me to understand if he is reporting any tingling or numbness and at least at 1 point he said it did tingle and feel numb at 1 point but not now.  He is also endorsing a lot of pain around the shoulder and under the armpit.  He says initial complaint was shoulder pain and the pain in the elbow and hand.  He does not endorse specific neck pain although he has had a history of neck and back problems.  He has been treated for lumbar spine issues and stenosis with injections.  He has had no prior cervical surgery.  No cervical MRI.  He does endorse significant viral illness sometime around the same time but unsure of it was before or after his symptoms.  He reports pretty significant flulike symptoms.  ROS Otherwise per HPI.  Assessment & Plan: Visit Diagnoses:    ICD-10-CM   1. Paresthesia of  skin  R20.2 NCV with EMG (electromyography)      Plan: Impression: The above electrodiagnostic study is somewhat difficult to interpret but is ABNORMAL and reveals evidence of most likely severe subacute on chronic C8 radiculopathy on the left but cannot rule out the possibility of a left-sided Parsonage-Turner syndrome or neurogenic amyotrophy.   There is also evidence of a moderate left median nerve entrapment at the wrist (carpal tunnel syndrome) affecting sensory and motor components.    There is no significant electrodiagnostic evidence of any other focal nerve entrapment, specifically ulnar nerve neuropathy or cubital tunnel issues.  Recommendations: 1.  Follow-up with referring physician. 2.  Continue current management of symptoms. 3.  Suggest MRI of the cervical spine versus shoulder/brachial plexus.  Meds & Orders: No orders of the defined types were placed in this encounter.   Orders Placed This Encounter  Procedures   NCV with EMG (electromyography)    Follow-up: Return for  Glee Arvin, MD.   Procedures: No procedures performed  EMG & NCV Findings: Evaluation of the left median motor nerve showed prolonged distal onset latency (4.5 ms), reduced amplitude (2.3 mV), and decreased conduction velocity (Elbow-Wrist, 49 m/s).  The left ulnar motor nerve showed reduced amplitude (2.3 mV).  The left median (across palm) sensory nerve showed no response (Palm) and prolonged distal  peak latency (4.2 ms).  All remaining nerves (as indicated in the following tables) were within normal limits.    Needle evaluation of the left abductor pollicis brevis and the left flexor digitorum profundus muscles showed increased insertional activity.  The left first dorsal interosseous muscle showed increased insertional activity and diminished recruitment.  The left Ext Digitorum muscle showed increased insertional activity, slightly increased spontaneous activity, and diminished recruitment.  The left  triceps muscle showed diminished recruitment.  All remaining muscles (as indicated in the following table) showed no evidence of electrical instability.    Impression: The above electrodiagnostic study is somewhat difficult to interpret but is ABNORMAL and reveals evidence of most likely severe subacute on chronic C8 radiculopathy on the left but cannot rule out the possibility of a left-sided Parsonage-Turner syndrome or neurogenic amyotrophy.   There is also evidence of a moderate left median nerve entrapment at the wrist (carpal tunnel syndrome) affecting sensory and motor components.    There is no significant electrodiagnostic evidence of any other focal nerve entrapment, specifically ulnar nerve neuropathy or cubital tunnel issues.  Recommendations: 1.  Follow-up with referring physician. 2.  Continue current management of symptoms. 3.  Suggest MRI of the cervical spine versus shoulder/brachial plexus.  ___________________________ Naaman Plummer FAAPMR Board Certified, American Board of Physical Medicine and Rehabilitation    Nerve Conduction Studies Anti Sensory Summary Table   Stim Site NR Peak (ms) Norm Peak (ms) P-T Amp (V) Norm P-T Amp Site1 Site2 Delta-P (ms) Dist (cm) Vel (m/s) Norm Vel (m/s)  Left Median Acr Palm Anti Sensory (2nd Digit)  31.6C  Wrist    *4.2 <3.6 13.6 >10 Wrist Palm  0.0    Palm *NR  <2.0          Left Radial Anti Sensory (Base 1st Digit)  30.4C  Wrist    2.3 <3.1 24.2  Wrist Base 1st Digit 2.3 0.0    Left Ulnar Anti Sensory (5th Digit)  32C  Wrist    3.7 <3.7 16.7 >15.0 Wrist 5th Digit 3.7 14.0 38 >38   Motor Summary Table   Stim Site NR Onset (ms) Norm Onset (ms) O-P Amp (mV) Norm O-P Amp Site1 Site2 Delta-0 (ms) Dist (cm) Vel (m/s) Norm Vel (m/s)  Left Median Motor (Abd Poll Brev)  31.3C  Wrist    *4.5 <4.2 *2.3 >5 Elbow Wrist 5.5 27.0 *49 >50  Elbow    10.0  1.6         Left Ulnar Motor (Abd Dig Min)  31.3C  Wrist    3.7 <4.2 *2.3 >3 B  Elbow Wrist 4.7 25.0 53 >53  B Elbow    8.4  3.7  A Elbow B Elbow 1.7 10.0 59 >53  A Elbow    10.1  3.7          EMG   Side Muscle Nerve Root Ins Act Fibs Psw Amp Dur Poly Recrt Int Dennie Bible Comment  Left Abd Poll Brev Median C8-T1 *Incr Nml Nml Nml Nml 0 Nml Nml   Left 1stDorInt Ulnar C8-T1 *Incr Nml Nml Nml Nml 0 *Reduced Nml   Left PronatorTeres Median C6-7 Nml Nml Nml Nml Nml 0 Nml Nml   Left Biceps Musculocut C5-6 Nml Nml Nml Nml Nml 0 Nml Nml   Left Deltoid Axillary C5-6 Nml Nml Nml Nml Nml 0 Nml Nml   Left Ext Digitorum  Radial (Post Int) C7-8 *Incr *1+ *1+ Nml Nml 0 *Reduced Nml   Left FlexDigProf  Ulnar C8,T1 *Incr Nml Nml Nml Nml 0 Nml Nml   Left Triceps Radial C6-7-8 Nml Nml Nml Nml Nml 0 *Reduced Nml     Nerve Conduction Studies Anti Sensory Left/Right Comparison   Stim Site L Lat (ms) R Lat (ms) L-R Lat (ms) L Amp (V) R Amp (V) L-R Amp (%) Site1 Site2 L Vel (m/s) R Vel (m/s) L-R Vel (m/s)  Median Acr Palm Anti Sensory (2nd Digit)  31.6C  Wrist *4.2   13.6   Wrist Palm     Palm             Radial Anti Sensory (Base 1st Digit)  30.4C  Wrist 2.3   24.2   Wrist Base 1st Digit     Ulnar Anti Sensory (5th Digit)  32C  Wrist 3.7   16.7   Wrist 5th Digit 38     Motor Left/Right Comparison   Stim Site L Lat (ms) R Lat (ms) L-R Lat (ms) L Amp (mV) R Amp (mV) L-R Amp (%) Site1 Site2 L Vel (m/s) R Vel (m/s) L-R Vel (m/s)  Median Motor (Abd Poll Brev)  31.3C  Wrist *4.5   *2.3   Elbow Wrist *49    Elbow 10.0   1.6         Ulnar Motor (Abd Dig Min)  31.3C  Wrist 3.7   *2.3   B Elbow Wrist 53    B Elbow 8.4   3.7   A Elbow B Elbow 59    A Elbow 10.1   3.7            Waveforms:            Clinical History: No specialty comments available.     Objective:  VS:  HT:    WT:   BMI:     BP:   HR: bpm  TEMP: ( )  RESP:  Physical Exam Musculoskeletal:        General: No tenderness.     Comments: Inspection reveals no mild atrophy of the left APB and FDI. There is  no swelling, color changes, allodynia or dystrophic changes.  He has weakness of the left finger abduction compared to right and some weakness with wrist extension more than flexion.  Long finger flexion seems intact.  Somewhat subjective but he reports increased sensation on the fifth digit more than the first digit.  Otherwise mostly intact sensation in the arm in all dermatomes and peripheral nerves.  There is a negative Hoffmann's test bilaterally.  Skin:    General: Skin is warm and dry.     Findings: No erythema or rash.  Neurological:     General: No focal deficit present.     Mental Status: He is alert and oriented to person, place, and time.     Sensory: No sensory deficit.     Motor: No weakness or abnormal muscle tone.     Coordination: Coordination normal.     Gait: Gait normal.  Psychiatric:        Mood and Affect: Mood normal.        Behavior: Behavior normal.        Thought Content: Thought content normal.     Imaging: No results found.

## 2020-08-29 NOTE — Progress Notes (Signed)
Pt complaints of left forearm pain that travel down to his left hand. Pt state he has pain in his left anterior and posterior  armpit. Pt state he feel weakness in his left hand. Pt state he just let his arm drop with his in pain. Pt states he is right handed.  Numeric Pain Rating Scale and Functional Assessment Average Pain 4   In the last MONTH (on 0-10 scale) has pain interfered with the following?  1. General activity like being  able to carry out your everyday physical activities such as walking, climbing stairs, carrying groceries, or moving a chair?  Rating(6)

## 2020-09-03 ENCOUNTER — Telehealth: Payer: Self-pay

## 2020-09-03 NOTE — Telephone Encounter (Signed)
Pt called and would like a call back today.. he is upset due to the fact he hasn't heard anything since his appt with [facility].  I made the pt an appt on the 23rd but he would still like a call

## 2020-09-03 NOTE — Telephone Encounter (Signed)
Called patient no answer. Phone just kept ringing could not leave VM.  Per last OV note he was suppose to follow up after NCS/EMG.

## 2020-09-05 ENCOUNTER — Encounter: Payer: Self-pay | Admitting: Orthopaedic Surgery

## 2020-09-05 ENCOUNTER — Other Ambulatory Visit: Payer: Self-pay

## 2020-09-05 ENCOUNTER — Ambulatory Visit (INDEPENDENT_AMBULATORY_CARE_PROVIDER_SITE_OTHER): Payer: Managed Care, Other (non HMO) | Admitting: Orthopaedic Surgery

## 2020-09-05 VITALS — BP 126/90 | HR 89 | Temp 98.4°F

## 2020-09-05 DIAGNOSIS — G5602 Carpal tunnel syndrome, left upper limb: Secondary | ICD-10-CM | POA: Diagnosis not present

## 2020-09-05 DIAGNOSIS — M5412 Radiculopathy, cervical region: Secondary | ICD-10-CM | POA: Diagnosis not present

## 2020-09-05 DIAGNOSIS — G8929 Other chronic pain: Secondary | ICD-10-CM | POA: Diagnosis not present

## 2020-09-05 DIAGNOSIS — M25512 Pain in left shoulder: Secondary | ICD-10-CM

## 2020-09-05 NOTE — Progress Notes (Signed)
   Office Visit Note   Patient: Ryan Rojas           Date of Birth: 1965-12-18           MRN: [id number] Visit Date: 09/05/2020              Requested by: Mechele Claude, MD [address] [place],  [place] [address] PCP: Mechele Claude, MD   Assessment & Plan: Visit Diagnoses:  1. Left carpal tunnel syndrome   2. Cervical radiculopathy     Plan: In terms of the chest pain I strongly recommended and urged him to go to either urgent care or ED as soon as he leaves our office.  His vital signs were stable and he did not demonstrate any labored breathing.  In terms of the nerve conduction studies these showed moderate carpal tunnel syndrome but also a severe C8 radiculopathy versus possible Parsonage-Turner syndrome.  Given these findings I have recommended a C-spine MRI as well as a left brachial plexus MRI to fully evaluate for pathology.  Follow-up after the MRI.  Follow-Up Instructions: Return for Follow-up after MRI.   Orders:  No orders of the defined types were placed in this encounter.  No orders of the defined types were placed in this encounter.     Procedures: No procedures performed   Clinical Data: No additional findings.   Subjective: Chief Complaint  Patient presents with   Left Hand - Pain    Ryan Rojas returns today for follow-up of nerve conduction studies recently done with Dr. Alvester Morin.  He is also complaining of left shoulder and upper extremity pain that has evolved into left-sided chest pain for about 3 days.   Review of Systems   Objective: Vital Signs: BP 126/90   Pulse 89   Temp 98.4 F (36.9 C)   Physical Exam  Ortho Exam Left upper extremity exam is unchanged. Specialty Comments:  No specialty comments available.  Imaging: No results found.   PMFS History: Patient Active Problem List   Diagnosis Date Noted   Cubital tunnel syndrome on left 08/16/2020   Intractable neuropathic pain of lumbosacral origin 05/20/2020   Combined arterial  insufficiency and corporo-venous occlusive erectile dysfunction 09/27/2019   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   Impotence 01/18/2018   Hyperlipidemia 07/16/2017   Past Medical History:  Diagnosis Date   Hyperlipidemia     Family History  Problem Relation Age of Onset   Asthma Mother    Diabetes Sister    Asthma Sister     History reviewed. No pertinent surgical history. Social History   Occupational History   Not on file  Tobacco Use   Smoking status: Never   Smokeless tobacco: Never  Vaping Use   Vaping Use: Never used  Substance and Sexual Activity   Alcohol use: Yes    Alcohol/week: 1.0 standard drink    Types: 1 Cans of beer per week   Drug use: No   Sexual activity: Yes    Birth control/protection: None

## 2020-09-05 NOTE — Addendum Note (Signed)
Addended by: Albertina Parr on: 09/05/2020 01:53 PM   Modules accepted: Orders

## 2020-09-09 ENCOUNTER — Telehealth: Payer: Self-pay

## 2020-09-09 ENCOUNTER — Other Ambulatory Visit: Payer: Self-pay | Admitting: Physician Assistant

## 2020-09-09 NOTE — Telephone Encounter (Signed)
Patient called stating that his left hand is getting worse and that he can't open or use his left hand.  Stated that something needs to be done or a medication sent to his pharmacy.  Cb# [phone].  Please advise.  Thank you

## 2020-09-09 NOTE — Telephone Encounter (Signed)
See message below. Pending MRI(2)

## 2020-09-09 NOTE — Telephone Encounter (Signed)
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  Yes to refilling the steroids.

## 2020-09-09 NOTE — Telephone Encounter (Signed)
Ryan Rojas,  Looks like he was on steroids and lyrica a month ago.  Anything else that you recommend for now?

## 2020-09-10 ENCOUNTER — Other Ambulatory Visit: Payer: Self-pay | Admitting: Physician Assistant

## 2020-09-10 ENCOUNTER — Other Ambulatory Visit: Payer: Self-pay

## 2020-09-10 DIAGNOSIS — G545 Neuralgic amyotrophy: Secondary | ICD-10-CM

## 2020-09-10 MED ORDER — PREDNISONE 10 MG (21) PO TBPK: ORAL_TABLET | ORAL | 0 refills | Status: DC

## 2020-09-10 NOTE — Telephone Encounter (Signed)
Ok thanks  [place],  I have refilled steroids.  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

## 2020-09-10 NOTE — Telephone Encounter (Signed)
Ok thanks 

## 2020-09-10 NOTE — Telephone Encounter (Signed)
Referral made.   Sabrina urgent referral please.

## 2020-09-12 NOTE — Telephone Encounter (Signed)
Pt has been scheduled for July 5 at 8.30 with Dr. Terrace Arabia. At [facility] neurology

## 2020-09-15 ENCOUNTER — Other Ambulatory Visit: Payer: Managed Care, Other (non HMO)

## 2020-09-15 ENCOUNTER — Other Ambulatory Visit: Payer: Self-pay

## 2020-09-15 ENCOUNTER — Ambulatory Visit
Admission: RE | Admit: 2020-09-15 | Discharge: 2020-09-15 | Disposition: A | Discharge status: Home / Self Care | Payer: Managed Care, Other (non HMO) | Source: Ambulatory Visit | Attending: Orthopaedic Surgery | Admitting: Orthopaedic Surgery

## 2020-09-15 DIAGNOSIS — G8929 Other chronic pain: Secondary | ICD-10-CM

## 2020-09-17 ENCOUNTER — Ambulatory Visit (INDEPENDENT_AMBULATORY_CARE_PROVIDER_SITE_OTHER): Payer: Managed Care, Other (non HMO) | Admitting: Neurology

## 2020-09-17 ENCOUNTER — Encounter: Payer: Self-pay | Admitting: Neurology

## 2020-09-17 VITALS — BP 130/81 | HR 74 | Ht 72.0 in | Wt 230.0 lb

## 2020-09-17 DIAGNOSIS — R29898 Other symptoms and signs involving the musculoskeletal system: Secondary | ICD-10-CM | POA: Diagnosis not present

## 2020-09-17 DIAGNOSIS — M542 Cervicalgia: Secondary | ICD-10-CM

## 2020-09-17 DIAGNOSIS — M79602 Pain in left arm: Secondary | ICD-10-CM

## 2020-09-17 MED ORDER — GABAPENTIN 300 MG PO CAPS: 600.0000 mg | ORAL_CAPSULE | Freq: Three times a day (TID) | ORAL | 11 refills | Status: DC

## 2020-09-17 NOTE — Patient Instructions (Addendum)
  Meds ordered this encounter  Medications   gabapentin (NEURONTIN) 300 MG capsule    Sig: Take 2 capsules (600 mg total) by mouth 3 (three) times daily.    Dispense:  180 capsule    Refill:  11     Take one gabapentin 300mg  three times a day, may take extra at night to help sleep.     predniSONE (STERAPRED UNI-PAK 21 TAB) 10 MG (21) TBPK tablet         Summary: Take as directed, Normal   Start: 09/10/2020    Pharmacy: [id number] [place],  - [address] AT [place] [address]       Patient Sig: Take as directed     Ordered on: 09/10/2020     Authorized by: 09/12/2020 L     Dispense: 21 tablet     Prednisone 10mg  tabs,  6 tabs x one morning 5 tabs x one morning 4 tabs x one morning 3 tabs x one morning 2 tabsx one morning 1 tab x one morning

## 2020-09-17 NOTE — Progress Notes (Signed)
Chief Complaint  Patient presents with   New Patient (Initial Visit)    Rm 16,alone,  c/o shooting pain in lower left arm and left side of chest, states he is unable to use arm and and fingers, pt is left handed, pain started 2 months ago, MRI done on 09/15/20      ASSESSMENT AND PLAN  Ryan Rojas is a 55 y.o. male   Subacute onset left ulnar arm, left chest pain, followed by weakness since early June 2022  In the dermatome of left C8, T1,  Most suggestive of left brachial plexopathy, involving lower trunk  MRI left shoulder has completed, results pending  MRI of cervical region to rule out left cervical radiculopathy  EMG nerve conduction study  Changed to gabapentin titrating up dosage up to 300 mg 2 tablets 3 times a day for better control of his neuropathic pain  Prednisone 10 mg 21 tablets,, tapering dose for 1 week     DIAGNOSTIC DATA (LABS, IMAGING, TESTING) - I reviewed patient records, labs, notes, testing and imaging myself where available.  Laboratory evaluation on September 05, 2020, normal CMP, creatinine one 0.4, negative troponin, normal CBC, hemoglobin of 14.6,  HISTORICAL  Ryan Rojas is a 55 year old left handed male, seen in request by orthopedic surgeon Dr. Roda Shutters, [place] and his primary care physician Stacks, [place] John, for evaluation of subacute onset left arm pain, left anterior chest muscle pain, initial evaluation was on September 17, 2020.  I reviewed and summarized the referring note. PMHX HLD,  Low back pain,   He works as Armed forces technical officer, spent most of the time writing, working on [employer], he woke up 1 day in the middle of May 2022, noticed numbness tingling painful sensation at the left ulnar forearm, extending to left fourth and the fifth fingers,  His symptoms continue to progress in the following days, later on the neuropathic pain spread to left arm involving left armpit, left anterior chest, he described significant sharp shooting pain, worry about  the heart attack, he presented to local emergency room, there was no cardiac etiology found to explain his left chest inner arm discomfort  About a week later, he began to noticed left hand weakness, difficulty to extend his left lateral to 3 fingers, weak grip,  Over the past couple months, his significant pain has much improved, but he still has intermittent flareup of the pain, especially when he coughs, moves suddenly, he would have such radiating pain at the left armpit to his left anterior chest, left ulnar arm and forearm, he continues to have weakness of left hands, mild improvement with Lyrica 200 mg daily  He denies gait abnormality, he denies bowel bladder incontinence, he denies right upper extremity involvement  PHYSICAL EXAM:   Vitals:   09/17/20 0822  BP: 130/81  Pulse: 74  Weight: 230 lb (104.3 kg)  Height: 6' (1.829 m)   Not recorded     Body mass index is 31.19 kg/m.  PHYSICAL EXAMNIATION:  Gen: NAD, conversant, well nourised, well groomed                     Cardiovascular: Regular rate rhythm, no peripheral edema, warm, nontender. Eyes: Conjunctivae clear without exudates or hemorrhage Neck: Supple, no carotid bruits. Pulmonary: Clear to auscultation bilaterally   NEUROLOGICAL EXAM:  MENTAL STATUS: Speech:    Speech is normal; fluent and spontaneous with normal comprehension.  Cognition:     Orientation to time, place and person  Normal recent and remote memory     Normal Attention span and concentration     Normal Language, naming, repeating,spontaneous speech     Fund of knowledge   CRANIAL NERVES: CN II: Visual fields are full to confrontation. Pupils are round equal and briskly reactive to light. CN III, IV, VI: extraocular movement are normal. No ptosis. CN V: Facial sensation is intact to light touch CN VII: Face is symmetric with normal eye closure  CN VIII: Hearing is normal to causal conversation. CN IX, X: Phonation is normal. CN XI:  Head turning and shoulder shrug are intact  MOTOR: Left proximal arm motor examination is limited because of his moderate left chest, armpit pain, right arm, bilateral lower extremity proximal and distal muscle strength is normal UE Shoulder Abduction Shoulder External Rotation Elbow Flexion Elbow  Extension Pronation Supination Wrist Flexion Wrist Extension Abductor pollicis brevis Opponents Finger  Abduction Wrist extension Finger Flexion  Finger extension  L 5 5 5 5 5 5 4 4 4 4 3 4 3 3    Mild atrophy of left first dorsal interossei, abductor pollicis brevis  REFLEXES: Reflexes are1 and symmetric at the biceps, triceps, knees, and ankles. Plantar responses are flexor.  SENSORY: Intact to light touch, pinprick and vibratory sensation are intact in fingers and toes.  COORDINATION: There is no trunk or limb dysmetria noted.  GAIT/STANCE: Posture is normal. Gait is steady with normal steps, base, arm swing, and turning. Heel and toe walking are normal. Tandem gait is normal.  Romberg is absent.  REVIEW OF SYSTEMS:  Full 14 system review of systems performed and notable only for as above All other review of systems were negative.   ALLERGIES: Allergies  Allergen Reactions   Hydrocodone Itching    HOME MEDICATIONS: Current Outpatient Medications  Medication Sig Dispense Refill   atorvastatin (LIPITOR) 40 MG tablet Take 1 tablet (40 mg total) by mouth daily. 90 tablet 1   predniSONE (STERAPRED UNI-PAK 21 TAB) 10 MG (21) TBPK tablet Take as directed 21 tablet 0   pregabalin (LYRICA) 200 MG capsule Take 1 capsule (200 mg total) by mouth daily. 90 capsule 1   sildenafil (VIAGRA) 100 MG tablet Take 0.5-1 tablets (50-100 mg total) by mouth daily as needed for erectile dysfunction. 24 tablet 1   No current facility-administered medications for this visit.    PAST MEDICAL HISTORY: Past Medical History:  Diagnosis Date   Hyperlipidemia     PAST SURGICAL HISTORY: History  reviewed. No pertinent surgical history.  FAMILY HISTORY: Family History  Problem Relation Age of Onset   Asthma Mother    Diabetes Sister    Asthma Sister     SOCIAL HISTORY: Social History   Socioeconomic History   Marital status: Single    Spouse name: Not on file   Number of children: Not on file   Years of education: Not on file   Highest education level: Not on file  Occupational History   Not on file  Tobacco Use   Smoking status: Never   Smokeless tobacco: Never  Vaping Use   Vaping Use: Never used  Substance and Sexual Activity   Alcohol use: Yes    Alcohol/week: 1.0 standard drink    Types: 1 Cans of beer per week   Drug use: No   Sexual activity: Yes    Birth control/protection: None  Other Topics Concern   Not on file  Social History Narrative   Not on file   Social  Determinants of Health   Financial Resource Strain: Not on file  Food Insecurity: Not on file  Transportation Needs: Not on file  Physical Activity: Not on file  Stress: Not on file  Social Connections: Not on file  Intimate Partner Violence: Not on file      Levert Feinstein, M.D. Ph.D.  [facility] [address] [place], [place] [address] Ph: [phone] Fax: [phone]  CC:  Tarry Kos, MD [address] [place],  [place] [address]  Mechele Claude, MD

## 2020-09-20 LAB — MULTIPLE MYELOMA PANEL, SERUM
Albumin SerPl Elph-Mcnc: 4.5 g/dL — ABNORMAL HIGH (ref 2.9–4.4)
Albumin/Glob SerPl: 1.7 (ref 0.7–1.7)
Alpha 1: 0.2 g/dL (ref 0.0–0.4)
Alpha2 Glob SerPl Elph-Mcnc: 0.8 g/dL (ref 0.4–1.0)
B-Globulin SerPl Elph-Mcnc: 1 g/dL (ref 0.7–1.3)
Gamma Glob SerPl Elph-Mcnc: 0.7 g/dL (ref 0.4–1.8)
Globulin, Total: 2.7 g/dL (ref 2.2–3.9)
IgA/Immunoglobulin A, Serum: 200 mg/dL (ref 90–386)
IgG (Immunoglobin G), Serum: 814 mg/dL (ref 603–1613)
IgM (Immunoglobulin M), Srm: 58 mg/dL (ref 20–172)
Total Protein: 7.2 g/dL (ref 6.0–8.5)

## 2020-09-20 LAB — VITAMIN B12: Vitamin B-12: 742 pg/mL (ref 232–1245)

## 2020-09-20 LAB — RPR: RPR Ser Ql: NONREACTIVE

## 2020-09-20 LAB — CK: Total CK: 410 U/L — ABNORMAL HIGH (ref 41–331)

## 2020-09-20 LAB — HIV ANTIBODY (ROUTINE TESTING W REFLEX): HIV Screen 4th Generation wRfx: NONREACTIVE

## 2020-09-20 LAB — C-REACTIVE PROTEIN: CRP: <1 mg/L (ref 0–10)

## 2020-09-20 LAB — HGB A1C W/O EAG: Hgb A1c MFr Bld: 6.1 % — ABNORMAL HIGH (ref 4.8–5.6)

## 2020-09-20 LAB — SEDIMENTATION RATE: Sed Rate: 2 mm/hr (ref 0–30)

## 2020-09-20 LAB — ANA W/REFLEX IF POSITIVE: Anti Nuclear Antibody (ANA): NEGATIVE

## 2020-09-20 LAB — TSH: TSH: 1.21 u[IU]/mL (ref 0.450–4.500)

## 2020-09-23 ENCOUNTER — Telehealth: Payer: Self-pay | Admitting: Neurology

## 2020-09-23 NOTE — Telephone Encounter (Signed)
Please call patient, laboratory evaluation showed mild elevated CPK 410, it has unknown clinical significance, we only worry about true intrinsic muscle disease if the number is above 1000, may consider repeat CPK level later  Mild elevated A1c 6.1, prediabetes, moderate exercise, diet,  I have forwarded the lab to his primary care Mechele Claude, MD   Rest of the laboratory evaluation showed no significant abnormalities

## 2020-09-23 NOTE — Telephone Encounter (Signed)
Pt verified by name and DOB, results given per provider, pt voiced understanding all question answered. 

## 2020-09-24 ENCOUNTER — Other Ambulatory Visit: Payer: Managed Care, Other (non HMO)

## 2020-09-24 ENCOUNTER — Telehealth: Payer: Self-pay | Admitting: Neurology

## 2020-09-24 NOTE — Telephone Encounter (Signed)
[facility]) called, PA for MRI cervical spine was sent, but no clinical information included. Fax clinical information to [employer] [phone]). Please include patient's full name, DOB on each sheet.

## 2020-09-25 NOTE — Telephone Encounter (Signed)
Patient called me complaining of chest pain and arm pain, states he is unable to move his arm(s). He is frustrated because the MRI has not yet been approved. I advised patient that the order for MRI was sent to GI, they obtain auth for MRIs done with [employer]. Advised patient that GI started the case with [employer], but appears to be pending for additional notes. Advised patient that I faxed the notes to [employer] this morning. He states he just spoke with [place] and they have not yet received them. I told patient that unfortunately we do not know how long it can take for information to appear on their fax machine. Advised patient to go to the ED if this situation is an emergency. He states he plans to call [employer] back again. He ended the call.

## 2020-09-25 NOTE — Telephone Encounter (Signed)
Order was sent to [facility], they obtain auth for [employer] patients. I sent a message to them to ask if they had received this message & if there is anything they need me to do.

## 2020-09-26 NOTE — Telephone Encounter (Signed)
Per vo by Dr. Terrace Arabia, she will complete the patient's NCV/EMG on 10/23/20 then pursue approval for MRI.

## 2020-09-26 NOTE — Telephone Encounter (Signed)
Received call from [employer] advising that MRI was denied, due to not receiving results for NCV/EMG. I see where this was ordered but has not yet been done by our office. Rosann Auerbach is needing a peer-to-peer done. If MD chooses to do p2p, it can be done by calling [phone] and selecting option 4. Case #[id number].

## 2020-09-30 ENCOUNTER — Telehealth: Payer: Self-pay | Admitting: Neurology

## 2020-09-30 DIAGNOSIS — M792 Neuralgia and neuritis, unspecified: Secondary | ICD-10-CM | POA: Insufficient documentation

## 2020-09-30 MED ORDER — OXCARBAZEPINE 150 MG PO TABS: 300.0000 mg | ORAL_TABLET | Freq: Two times a day (BID) | ORAL | 6 refills | Status: DC

## 2020-09-30 NOTE — Telephone Encounter (Signed)
I have called patient, he complains of significant left anterior chest, shoulder pain, gabapentin up to 300 mg 2 tablets 3 times a day did not help his symptoms much, he had stopped taking gabapentin, went back to Lyrica 200 mg every night, did not help him much either, he now complains of difficulty sleeping, still try to go to work, but very stressful,  He has finished prednisone tapering, did not see significant improvement,  EMG nerve conduction studies pending for October 24, 2018  MRI of the cervical spine will only be approved by his insurance after EMG nerve conduction study  MRI of left shoulder 1. 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 2. Long head biceps tendon is medially perched at the groove entry zone with focal tendinosis.    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

## 2020-09-30 NOTE — Telephone Encounter (Signed)
Please see chart note from 7/12. Patient has called our office several times, and he has had his insurance Counselling psychologist) call our office several times. Our office did not submit the MRI authorization request, [facility] did. The request was denied because insurance did not receive results from NCV/EMG. I have explained this to patient. Per Dr. Terrace Arabia, MRI will not be completed until NCV/EMG is done at our office on 8/10. Patient does not understand this, as he is under the impression that his results from NCV/EMG done at Dr. Pollard Blas office can be submitted for this request. Please call patient and explain that MRI will not be completed until NCV/EMG is done at our office by Dr. Terrace Arabia.

## 2020-09-30 NOTE — Telephone Encounter (Addendum)
I spoke to the patient. He is still at pain. States the prednisone and gabapentin are not helpful for his pain. Best call back #[phone].

## 2020-09-30 NOTE — Addendum Note (Signed)
Addended by: Levert Feinstein on: 09/30/2020 04:38 PM   Modules accepted: Orders

## 2020-09-30 NOTE — Telephone Encounter (Signed)
Pt called, records have not been sent to [facility] or peer to peer has not been done. Would like a call from the nurse. ]  Pt ask to be transferred to Medical Records to see if he can come pick up his records.

## 2020-10-07 ENCOUNTER — Other Ambulatory Visit: Payer: Self-pay

## 2020-10-07 ENCOUNTER — Ambulatory Visit (INDEPENDENT_AMBULATORY_CARE_PROVIDER_SITE_OTHER): Payer: Managed Care, Other (non HMO) | Admitting: Family Medicine

## 2020-10-07 ENCOUNTER — Encounter: Payer: Self-pay | Admitting: Family Medicine

## 2020-10-07 DIAGNOSIS — E782 Mixed hyperlipidemia: Secondary | ICD-10-CM

## 2020-10-07 MED ORDER — ATORVASTATIN CALCIUM 40 MG PO TABS: 40.0000 mg | ORAL_TABLET | Freq: Every day | ORAL | 1 refills | Status: DC

## 2020-10-07 MED ORDER — TADALAFIL 20 MG PO TABS: 10.0000 mg | ORAL_TABLET | ORAL | 11 refills | Status: DC | PRN

## 2020-10-07 MED ORDER — PREDNISONE 20 MG PO TABS: ORAL_TABLET | ORAL | 0 refills | Status: DC

## 2020-10-07 NOTE — Progress Notes (Signed)
Subjective:  Patient ID: Ryan Rojas, male    DOB: September 11, 1965  Age: 55 y.o. MRN: [id number]  CC: Medical Management of Chronic Issues   HPI Ryan Rojas presents for recheck of his neck shoulder and left chest pain.  Ryan Rojas was seen by Ortho and had an MRI showing a full-thickness tear of the anterior supraspinatus tendon.  From there Ortho send him to neurology and Ryan Rojas is now taking 3000 mg daily of gabapentin plus Trileptal with minimal pain relief.  His pain is severe.  Ryan Rojas is not getting relief with that.  However Ryan Rojas did not get any better relief with the Lyrica.  Currently Ryan Rojas expresses a great deal of frustration that has slow the process is going.  However, his insurance company has required that a nerve conduction/EMG be performed before Ryan Rojas can have an MRI of the cervical spine approved.  Apparently the cervical spine MRI is necessary to stage his condition for more than.  The Viagra substitute sildenafil does not seem to be helping a whole lot.  Although it is better than 1 things in.   in for follow-up of elevated cholesterol. Doing well without complaints on current medication. Denies side effects of statin including myalgia and arthralgia and nausea. Currently no chest pain, shortness of breath or other cardiovascular related symptoms noted.   Depression screen [facility] 2/9 10/07/2020 08/08/2020 08/08/2020  Decreased Interest 0 0 0  Down, Depressed, Hopeless 0 0 0  PHQ - 2 Score 0 0 0  Altered sleeping - 1 -  Tired, decreased energy - 1 -  Change in appetite - 0 -  Feeling bad or failure about yourself  - 0 -  Trouble concentrating - 0 -  Moving slowly or fidgety/restless - 0 -  Suicidal thoughts - 0 -  PHQ-9 Score - 2 -  Difficult doing work/chores - Not difficult at all -    History Ryan Rojas has a past medical history of Hyperlipidemia.   Ryan Rojas has no past surgical history on file.   His family history includes Asthma in his mother and sister; Diabetes in his sister.Ryan Rojas reports that Ryan Rojas  has never smoked. Ryan Rojas has never used smokeless tobacco. Ryan Rojas reports current alcohol use of about 1.0 standard drink of alcohol per week. Ryan Rojas reports that Ryan Rojas does not use drugs.    ROS Review of Systems  Constitutional:  Negative for fever.  Respiratory:  Negative for shortness of breath.   Cardiovascular:  Negative for chest pain.  Musculoskeletal:  Positive for arthralgias (left shoulder), myalgias (left chest) and neck pain.  Skin:  Negative for rash.   Objective:  BP 111/70   Pulse 77   Temp 97.7 F (36.5 C)   Ht 6' (1.829 m)   Wt 230 lb 3.2 oz (104.4 kg)   SpO2 98%   BMI 31.22 kg/m   BP Readings from Last 3 Encounters:  10/07/20 111/70  09/17/20 130/81  09/05/20 126/90    Wt Readings from Last 3 Encounters:  10/07/20 230 lb 3.2 oz (104.4 kg)  09/17/20 230 lb (104.3 kg)  08/16/20 233 lb (105.7 kg)     Physical Exam Vitals reviewed.  Constitutional:      Appearance: Ryan Rojas is well-developed.  HENT:     Head: Normocephalic and atraumatic.     Right Ear: External ear normal.     Left Ear: External ear normal.     Mouth/Throat:     Pharynx: No oropharyngeal exudate or posterior oropharyngeal erythema.  Eyes:  Pupils: Pupils are equal, round, and reactive to light.  Cardiovascular:     Rate and Rhythm: Normal rate and regular rhythm.     Heart sounds: No murmur heard. Pulmonary:     Effort: No respiratory distress.     Breath sounds: Normal breath sounds.  Musculoskeletal:     Cervical back: Normal range of motion and neck supple.  Neurological:     Mental Status: Ryan Rojas is alert and oriented to person, place, and time.  Psychiatric:        Attention and Perception: Attention normal.        Mood and Affect: Mood is depressed. Affect is angry.        Speech: Speech normal.        Behavior: Behavior normal.        Cognition and Memory: Cognition normal.      Assessment & Plan:   Ryan Rojas was seen today for medical management of chronic issues.  Diagnoses and  all orders for this visit:  Mixed hyperlipidemia -     CBC with Differential/Platelet -     CMP14+EGFR -     Lipid panel -     atorvastatin (LIPITOR) 40 MG tablet; Take 1 tablet (40 mg total) by mouth daily.  Other orders -     tadalafil (CIALIS) 20 MG tablet; Take 0.5-1 tablets (10-20 mg total) by mouth every other day as needed for erectile dysfunction. -     predniSONE (DELTASONE) 20 MG tablet; One twice daily with food for 2 weeks. Then one daily for 2 weeks      I have discontinued Ryan Rojas's predniSONE. I am also having him start on tadalafil and predniSONE. Additionally, I am having him maintain his sildenafil, gabapentin, OXcarbazepine, and atorvastatin.  Allergies as of 10/07/2020       Reactions   Hydrocodone Itching        Medication List        Accurate as of October 07, 2020  5:21 PM. If you have any questions, ask your nurse or doctor.          STOP taking these medications    predniSONE 10 MG (21) Tbpk tablet Commonly known as: STERAPRED UNI-PAK 21 TAB Replaced by: predniSONE 20 MG tablet Stopped by: Claretta Fraise, MD       TAKE these medications    atorvastatin 40 MG tablet Commonly known as: LIPITOR Take 1 tablet (40 mg total) by mouth daily.   gabapentin 300 MG capsule Commonly known as: NEURONTIN Take 2 capsules (600 mg total) by mouth 3 (three) times daily.   OXcarbazepine 150 MG tablet Commonly known as: Trileptal Take 2 tablets (300 mg total) by mouth 2 (two) times daily.   predniSONE 20 MG tablet Commonly known as: DELTASONE One twice daily with food for 2 weeks. Then one daily for 2 weeks Replaces: predniSONE 10 MG (21) Tbpk tablet Started by: Claretta Fraise, MD   sildenafil 100 MG tablet Commonly known as: Viagra Take 0.5-1 tablets (50-100 mg total) by mouth daily as needed for erectile dysfunction.   tadalafil 20 MG tablet Commonly known as: CIALIS Take 0.5-1 tablets (10-20 mg total) by mouth every other day as needed  for erectile dysfunction. Started by: Claretta Fraise, MD         Follow-up: No follow-ups on file.  Claretta Fraise, M.D.

## 2020-10-08 LAB — CMP14+EGFR
ALT: 28 IU/L (ref 0–44)
AST: 34 IU/L (ref 0–40)
Albumin/Globulin Ratio: 2.6 — ABNORMAL HIGH (ref 1.2–2.2)
Albumin: 5 g/dL — ABNORMAL HIGH (ref 3.8–4.9)
Alkaline Phosphatase: 72 IU/L (ref 44–121)
BUN/Creatinine Ratio: 13 (ref 9–20)
BUN: 13 mg/dL (ref 6–24)
Bilirubin Total: 0.5 mg/dL (ref 0.0–1.2)
CO2: 26 mmol/L (ref 20–29)
Calcium: 9.6 mg/dL (ref 8.7–10.2)
Chloride: 100 mmol/L (ref 96–106)
Creatinine, Ser: 1.04 mg/dL (ref 0.76–1.27)
Globulin, Total: 1.9 g/dL (ref 1.5–4.5)
Glucose: 85 mg/dL (ref 65–99)
Potassium: 4.4 mmol/L (ref 3.5–5.2)
Sodium: 139 mmol/L (ref 134–144)
Total Protein: 6.9 g/dL (ref 6.0–8.5)
eGFR: 86 mL/min/{1.73_m2} (ref >59)

## 2020-10-08 LAB — CBC WITH DIFFERENTIAL/PLATELET
Basophils Absolute: 0.1 10*3/uL (ref 0.0–0.2)
Basos: 1 %
EOS (ABSOLUTE): 0.1 10*3/uL (ref 0.0–0.4)
Eos: 2 %
Hematocrit: 44.3 % (ref 37.5–51.0)
Hemoglobin: 13.8 g/dL (ref 13.0–17.7)
Immature Grans (Abs): 0 10*3/uL (ref 0.0–0.1)
Immature Granulocytes: 0 %
Lymphocytes Absolute: 2.5 10*3/uL (ref 0.7–3.1)
Lymphs: 33 %
MCH: 26.6 pg (ref 26.6–33.0)
MCHC: 31.2 g/dL — ABNORMAL LOW (ref 31.5–35.7)
MCV: 85 fL (ref 79–97)
Monocytes Absolute: 0.6 10*3/uL (ref 0.1–0.9)
Monocytes: 8 %
Neutrophils Absolute: 4.3 10*3/uL (ref 1.4–7.0)
Neutrophils: 56 %
Platelets: 251 10*3/uL (ref 150–450)
RBC: 5.19 x10E6/uL (ref 4.14–5.80)
RDW: 14.2 % (ref 11.6–15.4)
WBC: 7.6 10*3/uL (ref 3.4–10.8)

## 2020-10-08 LAB — LIPID PANEL
Chol/HDL Ratio: 3.1 ratio (ref 0.0–5.0)
Cholesterol, Total: 187 mg/dL (ref 100–199)
HDL: 61 mg/dL (ref >39)
LDL Chol Calc (NIH): 113 mg/dL — ABNORMAL HIGH (ref 0–99)
Triglycerides: 68 mg/dL (ref 0–149)
VLDL Cholesterol Cal: 13 mg/dL (ref 5–40)

## 2020-10-23 ENCOUNTER — Ambulatory Visit (INDEPENDENT_AMBULATORY_CARE_PROVIDER_SITE_OTHER): Payer: Managed Care, Other (non HMO) | Admitting: Neurology

## 2020-10-23 ENCOUNTER — Encounter (INDEPENDENT_AMBULATORY_CARE_PROVIDER_SITE_OTHER): Payer: Managed Care, Other (non HMO) | Admitting: Neurology

## 2020-10-23 ENCOUNTER — Other Ambulatory Visit: Payer: Self-pay

## 2020-10-23 DIAGNOSIS — G540 Brachial plexus disorders: Secondary | ICD-10-CM | POA: Diagnosis not present

## 2020-10-23 DIAGNOSIS — R29898 Other symptoms and signs involving the musculoskeletal system: Secondary | ICD-10-CM | POA: Diagnosis not present

## 2020-10-23 DIAGNOSIS — Z0289 Encounter for other administrative examinations: Secondary | ICD-10-CM

## 2020-10-23 DIAGNOSIS — M79602 Pain in left arm: Secondary | ICD-10-CM

## 2020-10-23 DIAGNOSIS — M542 Cervicalgia: Secondary | ICD-10-CM | POA: Diagnosis not present

## 2020-10-23 NOTE — Progress Notes (Signed)
ASSESSMENT AND PLAN  Ryan Rojas is a 55 y.o. male   Subacute onset left ulnar arm, left chest pain, followed by weakness since early June 2022  In the dermatome of left C8, T1,  EMG nerve conduction study today demonstrate left lower brachial plexopathy, involving left C8-T1 mild involvement of C7  MRI left shoulder in July 2022 showed full-thickness incomplete tearing of the anterior supraspinatus tendon, which would not explain his current difficulty  MRI of cervical is pending to rule out structural abnormality  Gabapentin, Trileptal for neuropathic pain  Return to clinic in 6 months with Maralyn Sago, also explained to patient and his wife, his neuropathic pain likely will gradually subsided, it is okay for him to take less of neuropathic pain medication as needed, his left hand muscle weakness likely will take long time to recover     DIAGNOSTIC DATA (LABS, IMAGING, TESTING) - I reviewed patient records, labs, notes, testing and imaging myself where available.  Laboratory evaluation on September 05, 2020, normal CMP, creatinine one 0.4, negative troponin, normal CBC, hemoglobin of 14.6,  HISTORICAL  Ryan Rojas is a 55 year old left handed male, seen in request by orthopedic surgeon Dr. Roda Shutters, [place] and his primary care physician Stacks, Broadus John, for evaluation of subacute onset left arm pain, left anterior chest muscle pain, initial evaluation was on September 17, 2020.  I reviewed and summarized the referring note. PMHX HLD,  Low back pain,   He works as Armed forces technical officer, spent most of the time writing, working on Animator, he woke up 1 day in the middle of May 2022, noticed numbness tingling painful sensation at the left ulnar forearm, extending to left fourth and the fifth fingers,  His symptoms continue to progress in the following days, later on the neuropathic pain spread to left arm involving left armpit, left anterior chest, he described significant sharp shooting pain, worry  about the heart attack, he presented to local emergency room, there was no cardiac etiology found to explain his left chest inner arm discomfort  About a week later, he began to noticed left hand weakness, difficulty to extend his left lateral to 3 fingers, weak grip,  Over the past couple months, his significant pain has much improved, but he still has intermittent flareup of the pain, especially when he coughs, moves suddenly, he would have such radiating pain at the left armpit to his left anterior chest, left ulnar arm and forearm, he continues to have weakness of left hands, mild improvement with Lyrica 200 mg daily  He denies gait abnormality, he denies bowel bladder incontinence, he denies right upper extremity involvement  UPDATE October 23 2020: Patient is accompanied by his wife, return for electrodiagnostic study today, which confirmed diagnosis of left lower brachial plexopathy, mainly involving C8-T1, with minor involvement of C7,  Over the past few months, his pain has improved, today rated 3 out of 10, over left chest, ulnar arm, he continue have significant weakness, especially with finger abduction, extension,  In July, he complained of significant neuropathic pain, we have made further adjustment of his neuropathic medications, including Trileptal, gabapentin, he is no longer taking it, was recently started on second round of prednisone tapering  MRI of left shoulder showed full-thickness incomplete tearing of the anterior supraspinatus tendon insertion with up to 7 mm of tendon retraction, long head biceps tendon is medially perched at the group entry zone with focal tendinosis, above findings would not explain his current left arm weakness and sensory loss  PHYSICAL EXAM:    PHYSICAL EXAMNIATION:  Gen: NAD, conversant, well nourised, well groomed          NEUROLOGICAL EXAM:  MENTAL STATUS: Speech/cognition: Awake, alert, oriented to history taking and casual  conversation   CRANIAL NERVES: CN II: Visual fields are full to confrontation. Pupils are round equal and briskly reactive to light. CN III, IV, VI: extraocular movement are normal. No ptosis. CN V: Facial sensation is intact to light touch CN VII: Face is symmetric with normal eye closure  CN VIII: Hearing is normal to causal conversation. CN IX, X: Phonation is normal. CN XI: Head turning and shoulder shrug are intact  MOTOR: Left proximal arm motor examination is limited because of his moderate left chest, armpit pain, right arm, bilateral lower extremity proximal and distal muscle strength is normal UE Shoulder Abduction Shoulder External Rotation Elbow Flexion Elbow  Extension Pronation Supination Wrist Flexion Wrist Extension Abductor pollicis brevis Opponents Finger  Abduction Finger Flexion  Finger extension  L 5 5 5 5 5 5  5- 5- 3 4 2 4 2    Significant atrophy of left first dorsal interossei, mild atrophy of left abductor pollicis brevis  REFLEXES: Reflexes are1 and symmetric at the biceps, triceps, knees, and ankles. Plantar responses are flexor.  SENSORY: Intact to light touch, pinprick and vibratory sensation are intact in fingers and toes.  COORDINATION: There is no trunk or limb dysmetria noted.  GAIT/STANCE: Posture is normal.    REVIEW OF SYSTEMS:  Full 14 system review of systems performed and notable only for as above All other review of systems were negative.   ALLERGIES: Allergies  Allergen Reactions   Hydrocodone Itching    HOME MEDICATIONS: Current Outpatient Medications  Medication Sig Dispense Refill   atorvastatin (LIPITOR) 40 MG tablet Take 1 tablet (40 mg total) by mouth daily. 90 tablet 1   gabapentin (NEURONTIN) 300 MG capsule Take 2 capsules (600 mg total) by mouth 3 (three) times daily. 180 capsule 11   OXcarbazepine (TRILEPTAL) 150 MG tablet Take 2 tablets (300 mg total) by mouth 2 (two) times daily. 120 tablet 6   predniSONE  (DELTASONE) 20 MG tablet One twice daily with food for 2 weeks. Then one daily for 2 weeks 42 tablet 0   sildenafil (VIAGRA) 100 MG tablet Take 0.5-1 tablets (50-100 mg total) by mouth daily as needed for erectile dysfunction. 24 tablet 1   tadalafil (CIALIS) 20 MG tablet Take 0.5-1 tablets (10-20 mg total) by mouth every other day as needed for erectile dysfunction. 10 tablet 11   No current facility-administered medications for this visit.    PAST MEDICAL HISTORY: Past Medical History:  Diagnosis Date   Hyperlipidemia     PAST SURGICAL HISTORY: No past surgical history on file.  FAMILY HISTORY: Family History  Problem Relation Age of Onset   Asthma Mother    Diabetes Sister    Asthma Sister     SOCIAL HISTORY: Social History   Socioeconomic History   Marital status: Single    Spouse name: Not on file   Number of children: Not on file   Years of education: Not on file   Highest education level: Not on file  Occupational History   Not on file  Tobacco Use   Smoking status: Never   Smokeless tobacco: Never  Vaping Use   Vaping Use: Never used  Substance and Sexual Activity   Alcohol use: Yes    Alcohol/week: 1.0 standard drink  Types: 1 Cans of beer per week   Drug use: No   Sexual activity: Yes    Birth control/protection: None  Other Topics Concern   Not on file  Social History Narrative   Not on file   Social Determinants of Health   Financial Resource Strain: Not on file  Food Insecurity: Not on file  Transportation Needs: Not on file  Physical Activity: Not on file  Stress: Not on file  Social Connections: Not on file  Intimate Partner Violence: Not on file      Levert Feinstein, M.D. Ph.D.  [facility] [address] [place], [place] [address] Ph: [phone] Fax: [phone]  CC:  Mechele Claude, MD [address] [place],  [place] [address]  Mechele Claude, MD

## 2020-11-01 NOTE — Procedures (Signed)
Full Name: Ryan Rojas Gender: Male MRN #: [id number] Date of Birth: Feb 18, 1966    Visit Date: 10/23/2020 08:03 Age: 55 Years Examining Physician: Levert Feinstein, MD  Referring Physician: Levert Feinstein, MD Height: 6 feet 0 inch Patient History: 72lbs History: 55 year old male, presented with subacute onset left arm pain, sensory loss, weakness, since June 2022, mild improvement over the past few months  Summary of the test: Nerve conduction study: Right median, bilateral ulnar sensory responses were normal.  Bilateral median and ulnar mixed response were within normal limit.  Left median sensory response showed borderline peak latency, with mildly decreased snap amplitude.  Right median and ulnar motor responses were normal  Left ulnar motor response showed mildly prolonged distal latency, significantly decreased CMAP amplitude.  Left median motor response showed mildly prolonged distal latency, with mildly decreased CMAP amplitude,  Bilateral ulnar F latency was mildly prolonged, left side has decreased appearance of F-wave latency  Electromyography: Selected needle examination was performed at left upper extremity muscles, and left cervical paraspinal muscles.  There is evidence of active neuropathic changes at left C8 and T1 myotomes, with milder involvement of left C7 myotomes.  There was no spontaneous activity at left cervical paraspinal muscles.  Conclusion: This is an abnormal study.  There is electrodiagnostic evidence of active neuropathic changes mainly involving left C8 or T1 myotomes, mild involvement of left C7, in combination with his clinical history, physical exam, above findings are most supportive of left lower brachial plexopathy.    ------------------------------- Levert Feinstein M.D. PhD  [facility] [address] [place], [place] [address] Tel: [phone] Fax: [phone]  Verbal informed consent was obtained from the  patient, patient was informed of potential risk of procedure, including bruising, bleeding, hematoma formation, infection, muscle weakness, muscle pain, numbness, among others.        MNC    Nerve / Sites Muscle Latency Ref. Amplitude Ref. Rel Amp Segments Distance Velocity Ref. Area    ms ms mV mV %  cm m/s m/s mVms  R Median - APB     Wrist APB 4.4 ?4.4 5.3 ?4.0 100 Wrist - APB 7   20.5     Upper arm APB 9.8  3.6  66.9 Upper arm - Wrist 28 51 ?49 16.1  L Median - APB     Wrist APB 5.0 ?4.4 3.1 ?4.0 100 Wrist - APB 7   9.2     Upper arm APB 10.5  2.2  73.2 Upper arm - Wrist 27 49 ?49 7.3  R Ulnar - ADM     Wrist ADM 3.0 ?3.3 7.7 ?6.0 100 Wrist - ADM 7   21.7     B.Elbow ADM 7.6  6.3  81.8 B.Elbow - Wrist 25 55 ?49 18.4     A.Elbow ADM 9.5  7.5  119 A.Elbow - B.Elbow 10 53 ?49 22.1  L Ulnar - ADM     Wrist ADM 3.8 ?3.3 1.4 ?6.0 100 Wrist - ADM 7   3.3     B.Elbow ADM 8.6  1.7  124 B.Elbow - Wrist 24 51 ?49 3.8     A.Elbow ADM 10.6  1.5  91.2 A.Elbow - B.Elbow 10 49 ?49 3.8             SNC    Nerve / Sites Rec. Site Peak Lat Ref.  Amp Ref. Segments Distance Peak Diff Ref.    ms ms V V  cm  ms ms  R Median, Ulnar - Transcarpal comparison     Median Palm Wrist 2.3 ?2.2 31 ?35 Median Palm - Wrist 8       Ulnar Palm Wrist 2.0 ?2.2 18 ?12 Ulnar Palm - Wrist 8          Median Palm - Ulnar Palm  0.2 ?0.4  L Median, Ulnar - Transcarpal comparison     Median Palm Wrist 2.5 ?2.2 12 ?35 Median Palm - Wrist 8       Ulnar Palm Wrist 2.2 ?2.2 11 ?12 Ulnar Palm - Wrist 8          Median Palm - Ulnar Palm  0.3 ?0.4  R Median - Orthodromic (Dig II, Mid palm)     Dig II Wrist 3.4 ?3.4 11 ?10 Dig II - Wrist 13    L Median - Orthodromic (Dig II, Mid palm)     Dig II Wrist 3.4 ?3.4 7 ?10 Dig II - Wrist 13    R Ulnar - Orthodromic, (Dig V, Mid palm)     Dig V Wrist 3.0 ?3.1 6 ?5 Dig V - Wrist 11    L Ulnar - Orthodromic, (Dig V, Mid palm)     Dig V Wrist 3.1 ?3.1 5 ?5 Dig V - Wrist 96                    F  Wave    Nerve F Lat Ref.   ms ms  R Ulnar - ADM 34.6 ?32.0  L Ulnar - ADM 35.1 ?32.0         EMG Summary Table    Spontaneous MUAP Recruitment  Muscle IA Fib PSW Fasc Other Amp Dur. Poly Pattern  L. Abductor digiti minimi (manus) Increased 2+ 2+ None _______ Normal Normal Normal Discrete  L. Abductor pollicis brevis Increased 3+ 3+ None _______ Increased Increased 2+ Reduced  L. First dorsal interosseous Increased 2+ 2+ None _______ Increased Increased 2+ Reduced  L. Pronator teres Increased None None None _______ Normal Normal Normal Reduced  L. Brachioradialis Normal None None None _______ Normal Normal Normal Normal  L. Extensor digitorum communis Increased 2+ 2+ None _______ Normal Normal Normal Discrete  L. Extensor carpi radialis longus Increased 1+ 1+ None _______ Increased Increased 1+ Reduced  L. Extensor carpi radialis brevis Increased 1+ None None _______ Increased Increased 1+ Reduced  L. Flexor digitorum profundus (Ulnar) Increased None None None _______ Increased Increased 1+ Reduced  L. Flexor carpi ulnaris Increased 1+ 1+ None _______ Increased Increased Normal Reduced  L. Pectoralis major Normal None None None _______ Normal Normal Normal Normal  L. Cervical paraspinals Normal None None None _______ Normal Normal Normal Normal  L. Triceps brachii Increased None None None _______ Increased Increased Normal Reduced

## 2021-01-01 DIAGNOSIS — G8929 Other chronic pain: Secondary | ICD-10-CM

## 2021-01-01 NOTE — Telephone Encounter (Signed)
Worsening symptoms related to brachial plexopathy. Per vo by Dr. Terrace Arabia, work-in to her schedule on 01/02/21.

## 2021-01-02 ENCOUNTER — Other Ambulatory Visit: Payer: Self-pay

## 2021-01-02 ENCOUNTER — Ambulatory Visit (INDEPENDENT_AMBULATORY_CARE_PROVIDER_SITE_OTHER): Payer: Managed Care, Other (non HMO) | Admitting: Neurology

## 2021-01-02 ENCOUNTER — Encounter: Payer: Self-pay | Admitting: Neurology

## 2021-01-02 VITALS — BP 131/81 | HR 84 | Ht 72.0 in | Wt 238.6 lb

## 2021-01-02 DIAGNOSIS — R29898 Other symptoms and signs involving the musculoskeletal system: Secondary | ICD-10-CM

## 2021-01-02 DIAGNOSIS — G8929 Other chronic pain: Secondary | ICD-10-CM

## 2021-01-02 DIAGNOSIS — R202 Paresthesia of skin: Secondary | ICD-10-CM | POA: Insufficient documentation

## 2021-01-02 DIAGNOSIS — M25511 Pain in right shoulder: Secondary | ICD-10-CM

## 2021-01-02 DIAGNOSIS — R5383 Other fatigue: Secondary | ICD-10-CM | POA: Diagnosis not present

## 2021-01-02 MED ORDER — MELOXICAM 15 MG PO TABS: 15.0000 mg | ORAL_TABLET | Freq: Every day | ORAL | 3 refills | Status: DC

## 2021-01-02 NOTE — Progress Notes (Signed)
ASSESSMENT AND PLAN  Ryan Rojas is a 55 y.o. male   Subacute onset left ulnar arm, left chest pain, followed by weakness since early June 2022  In the dermatome of left C8, T1,  EMG nerve conduction study today demonstrate left lower brachial plexopathy, involving left C8-T1 mild involvement of C7  MRI left shoulder in July 2022 showed full-thickness incomplete tearing of the anterior supraspinatus tendon, which would not explain his current difficulty  MRI of cervical is pending to rule out structural abnormality, was canceled by patient previously, reorder  Is no longer taking neuropathic pain medication,' New onset right shoulder pain  Most consistent with musculoskeletal etiology,  Patient declined further evaluation, MRI right shoulder  Suggested heating pad, Mobic as needed Poor sleep quality, frequent awakening, catching for air, dry mouth  High risk for obstructive sleep apnea, very narrow oropharyngeal space, obesity,  Refer to sleep study   DIAGNOSTIC DATA (LABS, IMAGING, TESTING) - I reviewed patient records, labs, notes, testing and imaging myself where available.  Laboratory evaluation on September 05, 2020, normal CMP, creatinine one 0.4, negative troponin, normal CBC, hemoglobin of 14.6,  HISTORICAL  Ryan Rojas is a 55 year old left handed male, seen in request by orthopedic surgeon Dr. Roda Shutters, [place] and his primary care physician Stacks, Broadus John, for evaluation of subacute onset left arm pain, left anterior chest muscle pain, initial evaluation was on September 17, 2020.  I reviewed and summarized the referring note. PMHX HLD,  Low back pain,   He works as Armed forces technical officer, spent most of the time writing, working on Animator, he woke up 1 day in the middle of May 2022, noticed numbness tingling painful sensation at the left ulnar forearm, extending to left fourth and the fifth fingers,  His symptoms continue to progress in the following days, later on the  neuropathic pain spread to left arm involving left armpit, left anterior chest, he described significant sharp shooting pain, worry about the heart attack, he presented to local emergency room, there was no cardiac etiology found to explain his left chest inner arm discomfort  About a week later, he began to noticed left hand weakness, difficulty to extend his left lateral to 3 fingers, weak grip,  Over the past couple months, his significant pain has much improved, but he still has intermittent flareup of the pain, especially when he coughs, moves suddenly, he would have such radiating pain at the left armpit to his left anterior chest, left ulnar arm and forearm, he continues to have weakness of left hands, mild improvement with Lyrica 200 mg daily  He denies gait abnormality, he denies bowel bladder incontinence, he denies right upper extremity involvement  UPDATE October 23 2020: Patient is accompanied by his wife, return for electrodiagnostic study today, which confirmed diagnosis of left lower brachial plexopathy, mainly involving C8-T1, with minor involvement of C7,  Over the past few months, his pain has improved, today rated 3 out of 10, over left chest, ulnar arm, he continue have significant weakness, especially with finger abduction, extension,  In July, he complained of significant neuropathic pain, we have made further adjustment of his neuropathic medications, including Trileptal, gabapentin, he is no longer taking it, was recently started on second round of prednisone tapering  MRI of left shoulder showed full-thickness incomplete tearing of the anterior supraspinatus tendon insertion with up to 7 mm of tendon retraction, long head biceps tendon is medially perched at the group entry zone with focal tendinosis, above findings would  not explain his current left arm weakness and sensory loss  Update January 02, 2021 His left shoulder pain has much improved, but since the end of October  2022, he has developed right shoulder pain, this is different from his left shoulder aching pain, right shoulder pain is induced by certain movements of his right shoulder, not getting better over the past few months, he also complains of left ankle pain,  Continue have significant left hand weakness  He also complains of difficulty sleeping, dry mouth, catching for air, very narrow oropharyngeal space  PHYSICAL EXAM:    PHYSICAL EXAMNIATION:  Gen: NAD, conversant, well nourised, well groomed          NEUROLOGICAL EXAM:  MENTAL STATUS: Speech/cognition: Awake, alert, oriented to history taking and casual conversation   CRANIAL NERVES: CN II: Visual fields are full to confrontation. Pupils are round equal and briskly reactive to light. CN III, IV, VI: extraocular movement are normal. No ptosis. CN V: Facial sensation is intact to light touch CN VII: Face is symmetric with normal eye closure  CN VIII: Hearing is normal to causal conversation. CN IX, X: Phonation is normal. CN XI: Head turning and shoulder shrug are intact  MOTOR: Left proximal arm motor examination is limited because of his moderate left chest, armpit pain, right arm, bilateral lower extremity proximal and distal muscle strength is normal UE Shoulder Abduction Shoulder External Rotation Elbow Flexion Elbow  Extension Pronation Supination Wrist Flexion Wrist Extension Abductor pollicis brevis Opponents Finger  Abduction Finger Flexion  Finger extension  L 5 5 5 5 5 5  5- 5- 3 4 2 4 2    Significant atrophy of left first dorsal interossei, mild atrophy of left abductor pollicis brevis  Right upper extremity strength limited by his right shoulder pain, limited range of motion,, tenderness of right anterior shoulder region upon deep palpitation  REFLEXES: Reflexes are1 and symmetric at the biceps, triceps, knees, and ankles. Plantar responses are flexor.  SENSORY: Intact to light touch, pinprick and vibratory  sensation are intact in fingers and toes.  COORDINATION: There is no trunk or limb dysmetria noted.  GAIT/STANCE: Posture is normal.    REVIEW OF SYSTEMS:  Full 14 system review of systems performed and notable only for as above All other review of systems were negative.   ALLERGIES: Allergies  Allergen Reactions   Hydrocodone Itching    HOME MEDICATIONS: Current Outpatient Medications  Medication Sig Dispense Refill   atorvastatin (LIPITOR) 40 MG tablet Take 1 tablet (40 mg total) by mouth daily. 90 tablet 1   gabapentin (NEURONTIN) 300 MG capsule Take 2 capsules (600 mg total) by mouth 3 (three) times daily. 180 capsule 11   OXcarbazepine (TRILEPTAL) 150 MG tablet Take 2 tablets (300 mg total) by mouth 2 (two) times daily. 120 tablet 6   sildenafil (VIAGRA) 100 MG tablet Take 0.5-1 tablets (50-100 mg total) by mouth daily as needed for erectile dysfunction. 24 tablet 1   No current facility-administered medications for this visit.    PAST MEDICAL HISTORY: Past Medical History:  Diagnosis Date   Hyperlipidemia     PAST SURGICAL HISTORY: History reviewed. No pertinent surgical history.  FAMILY HISTORY: Family History  Problem Relation Age of Onset   Asthma Mother    Diabetes Sister    Asthma Sister     SOCIAL HISTORY: Social History   Socioeconomic History   Marital status: Single    Spouse name: Not on file   Number of children:  Not on file   Years of education: Not on file   Highest education level: Not on file  Occupational History   Not on file  Tobacco Use   Smoking status: Never   Smokeless tobacco: Never  Vaping Use   Vaping Use: Never used  Substance and Sexual Activity   Alcohol use: Yes    Alcohol/week: 1.0 standard drink    Types: 1 Cans of beer per week   Drug use: No   Sexual activity: Yes    Birth control/protection: None  Other Topics Concern   Not on file  Social History Narrative   Not on file   Social Determinants of Health    Financial Resource Strain: Not on file  Food Insecurity: Not on file  Transportation Needs: Not on file  Physical Activity: Not on file  Stress: Not on file  Social Connections: Not on file  Intimate Partner Violence: Not on file      Levert Feinstein, M.D. Ph.D.  [facility] [address] [place], [place] [address] Ph: [phone] Fax: [phone]  CC:  Mechele Claude, MD [address] [place],  [place] [address]  Mechele Claude, MD

## 2021-01-15 ENCOUNTER — Ambulatory Visit
Admission: RE | Admit: 2021-01-15 | Discharge: 2021-01-15 | Disposition: A | Discharge status: Home / Self Care | Payer: Managed Care, Other (non HMO) | Source: Ambulatory Visit | Attending: Neurology | Admitting: Neurology

## 2021-01-15 ENCOUNTER — Other Ambulatory Visit: Payer: Self-pay

## 2021-01-15 DIAGNOSIS — R29898 Other symptoms and signs involving the musculoskeletal system: Secondary | ICD-10-CM

## 2021-01-15 DIAGNOSIS — G8929 Other chronic pain: Secondary | ICD-10-CM

## 2021-01-15 DIAGNOSIS — R202 Paresthesia of skin: Secondary | ICD-10-CM | POA: Diagnosis not present

## 2021-01-17 ENCOUNTER — Telehealth: Payer: Self-pay | Admitting: Neurology

## 2021-01-17 NOTE — Telephone Encounter (Signed)
I spoke to the patient and provided the MRI results below. He verbalized understanding and is in agreement to the ortho referral.

## 2021-01-17 NOTE — Telephone Encounter (Addendum)
IMPRESSION: 1. Moderate tendinosis of the supraspinatus tendon with a small partial-thickness articular surface tear. 2. Mild tendinosis of the infraspinatus tendon. 3. Partial-thickness tear of the peripheral fibers of the subscapularis tendon. 4. Intact long head of the biceps tendon with medial subluxation. 5. Partial-thickness cartilage loss of the glenohumeral joint with areas of high-grade partial-thickness cartilage loss of the inferior glenoid with subchondral reactive marrow change.    IMPRESSION: Abnormal MRI scan of the cervical spine without contrast showing prominent disc osteophyte diffuse bulge at C 5-6 with moderate bilateral foraminal and mild canal stenosis.  C6-7 also shows moderate left-sided foraminal stenosis.     Please call patient, MRI of right shoulder showed significant abnormality, involving multiple tendon tears, tendinosis, loss of cartilage at the shoulder joint  I will refer him to orthopedic evaluation for right shoulder  MRI of cervical spine showed lower thoracic spine degenerative changes, no evidence of spinal cord compression, variable degree of foraminal narrowing, but not severe enough to explain his sudden onset left arm weakness and sensory loss.

## 2021-01-17 NOTE — Telephone Encounter (Signed)
IMPRESSION: 1. Moderate tendinosis of the supraspinatus tendon with a small partial-thickness articular surface tear. 2. Mild tendinosis of the infraspinatus tendon. 3. Partial-thickness tear of the peripheral fibers of the subscapularis tendon. 4. Intact long head of the biceps tendon with medial subluxation. 5. Partial-thickness cartilage loss of the glenohumeral joint with areas of high-grade partial-thickness cartilage loss of the inferior glenoid with subchondral reactive marrow change.   Please call patient, MRI of right shoulder showed significant abnormality, involving multiple tendon tears, tendinosis, loss of cartilage at the shoulder joint  I will refer him to orthopedic evaluation for right shoulder

## 2021-01-24 ENCOUNTER — Other Ambulatory Visit: Payer: Self-pay

## 2021-01-24 ENCOUNTER — Encounter: Payer: Self-pay | Admitting: Orthopaedic Surgery

## 2021-01-24 ENCOUNTER — Ambulatory Visit (INDEPENDENT_AMBULATORY_CARE_PROVIDER_SITE_OTHER): Payer: Managed Care, Other (non HMO) | Admitting: Orthopaedic Surgery

## 2021-01-24 DIAGNOSIS — G8929 Other chronic pain: Secondary | ICD-10-CM | POA: Diagnosis not present

## 2021-01-24 DIAGNOSIS — M25511 Pain in right shoulder: Secondary | ICD-10-CM | POA: Diagnosis not present

## 2021-01-24 MED ORDER — DIAZEPAM 5 MG PO TABS: 5.0000 mg | ORAL_TABLET | Freq: Once | ORAL | 0 refills | Status: AC

## 2021-01-24 NOTE — Progress Notes (Signed)
Office Visit Note   Patient: Ryan Rojas           Date of Birth: [birth year]           MRN: [id number] Visit Date: 01/24/2021              Requested by: Levert Feinstein, MD [address] [place],  [place] [address] PCP: Mechele Claude, MD   Assessment & Plan: Visit Diagnoses:  1. Chronic right shoulder pain     Plan: Impression is chronic right shoulder pain.  I feel that the glenohumeral arthritis is likely the cause for the pain.  Therefore I have recommended intra-articular cortisone injection and we will set him up with Dr. Alvester Morin for this.  I gave him a couple tablets of Valium to help him ease his nerves during the injection.  Home exercises provided as well.  Follow-Up Instructions: No follow-ups on file.   Orders:  No orders of the defined types were placed in this encounter.  Meds ordered this encounter  Medications   diazepam (VALIUM) 5 MG tablet    Sig: Take 1-2 tablets (5-10 mg total) by mouth once for 1 dose.    Dispense:  2 tablet    Refill:  0      Procedures: No procedures performed   Clinical Data: No additional findings.   Subjective: Chief Complaint  Patient presents with   Right Shoulder - Pain    Ryan Rojas is a 55 year old gentleman here for evaluation of chronic right shoulder pain since August.  Denies any injuries or trauma or previous surgeries.  He had an MRI recently that was ordered by neurologist and a referral was sent to [place] for further evaluation and treatment.  Denies any numbness or tingling.  Has not had any physical therapy or injections.  He has been using meloxicam without significant relief.  He states that he has pain that goes from the front of the shoulder through the back.  Denies any lateral shoulder pain.   Review of Systems  Constitutional: Negative.   All other systems reviewed and are negative.   Objective: Vital Signs: There were no vitals taken for this visit.  Physical Exam Vitals and nursing note reviewed.   Constitutional:      Appearance: He is well-developed.  Pulmonary:     Effort: Pulmonary effort is normal.  Abdominal:     Palpations: Abdomen is soft.  Skin:    General: Skin is warm.  Neurological:     Mental Status: He is alert and oriented to person, place, and time.  Psychiatric:        Behavior: Behavior normal.        Thought Content: Thought content normal.        Judgment: Judgment normal.    Ortho Exam  Right shoulder shows normal passive range of motion with mild pain.  Active range of motion is fairly normal with moderate pain.  Manual muscle testing of the rotator cuff is grossly intact.  Negative Hawkins Neer cross body Speed and empty can belly press test.  No tenderness to the [facility] joint or the biceps tendon.  Specialty Comments:  No specialty comments available.  Imaging: No results found.   PMFS History: Patient Active Problem List   Diagnosis Date Noted   Arm paresthesia, left 01/02/2021   Chronic right shoulder pain 01/02/2021   Other fatigue 01/02/2021   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   Neuropathic pain 09/30/2020   Left arm weakness 09/17/2020  Left arm pain 09/17/2020   Neck pain on left side 09/17/2020   Cubital tunnel syndrome on left 08/16/2020   Intractable neuropathic pain of lumbosacral origin 05/20/2020   Combined arterial insufficiency and corporo-venous occlusive erectile dysfunction 09/27/2019   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   Impotence 01/18/2018   Hyperlipidemia 07/16/2017   Past Medical History:  Diagnosis Date   Hyperlipidemia     Family History  Problem Relation Age of Onset   Asthma Mother    Diabetes Sister    Asthma Sister     History reviewed. No pertinent surgical history. Social History   Occupational History   Not on file  Tobacco Use   Smoking status: Never   Smokeless tobacco: Never  Vaping Use   Vaping Use: Never used  Substance and Sexual Activity   Alcohol use: Yes    Alcohol/week: 1.0 standard drink     Types: 1 Cans of beer per week   Drug use: No   Sexual activity: Yes    Birth control/protection: None

## 2021-02-03 ENCOUNTER — Ambulatory Visit: Payer: Self-pay

## 2021-02-03 ENCOUNTER — Ambulatory Visit (INDEPENDENT_AMBULATORY_CARE_PROVIDER_SITE_OTHER): Payer: Managed Care, Other (non HMO) | Admitting: Physical Medicine and Rehabilitation

## 2021-02-03 ENCOUNTER — Encounter: Payer: Self-pay | Admitting: Physical Medicine and Rehabilitation

## 2021-02-03 ENCOUNTER — Other Ambulatory Visit: Payer: Self-pay

## 2021-02-03 DIAGNOSIS — M25511 Pain in right shoulder: Secondary | ICD-10-CM

## 2021-02-03 DIAGNOSIS — G8929 Other chronic pain: Secondary | ICD-10-CM | POA: Diagnosis not present

## 2021-02-03 NOTE — Progress Notes (Signed)
Pt state right shoulder pain. Pt state any movement with his right arm makes the pain worse. Pt state he takes pain meds and uses heat to help ease his pain.  Numeric Pain Rating Scale and Functional Assessment Average Pain 4   In the last MONTH (on 0-10 scale) has pain interfered with the following?  1. General activity like being  able to carry out your everyday physical activities such as walking, climbing stairs, carrying groceries, or moving a chair?  Rating(10)   -BT, -Dye Allergies.

## 2021-02-04 MED ORDER — TRIAMCINOLONE ACETONIDE 40 MG/ML IJ SUSP
40.0000 mg | INTRAMUSCULAR | Status: AC | PRN
  Administered 2021-02-03: 40 mg via INTRA_ARTICULAR

## 2021-02-04 MED ORDER — BUPIVACAINE HCL 0.25 % IJ SOLN
5.0000 mL | INTRAMUSCULAR | Status: AC | PRN
  Administered 2021-02-03: 5 mL via INTRA_ARTICULAR

## 2021-02-04 NOTE — Progress Notes (Signed)
Ryan Rojas - 55 y.o. male MRN [id number]  Date of birth: [birth year]  Office Visit Note: Visit Date: 02/03/2021 PCP: Mechele Claude, MD Referred by: Mechele Claude, MD  Subjective: Chief Complaint  Patient presents with   Right Shoulder - Pain   HPI:  Ryan Rojas is a 55 y.o. male who comes in today at the request of Dr. Glee Arvin for planned Right anesthetic glenohumeral arthrogram with fluoroscopic guidance.  The patient has failed conservative care including home exercise, medications, time and activity modification.  This injection will be diagnostic and hopefully therapeutic.  Please see requesting physician notes for further details and justification.  Although patient receives epidural injections at another facility in [place] for his lower back he is not very fond of injections in general.  He did have some Valium prior to the procedure.  He actually did well with the procedure in general.  Post procedure he did have some relief of his shoulder symptoms.  He had asked me if it was possible that I could look at his lumbar spine at some point as consultation.  I did tell him that we could do that if he would get his records from [facility] and a possible referral from them although we did not technically need the referral.  I did talk to him at length about this and gave him a card with our information on it.  I can see the notes from [facility] on care everywhere.  He has not had prior lumbar surgery.  ROS Otherwise per HPI.  Assessment & Plan: Visit Diagnoses:    ICD-10-CM   1. Chronic right shoulder pain  M25.511 XR C-ARM NO REPORT   G89.29       Plan: No additional findings.   Meds & Orders: No orders of the defined types were placed in this encounter.   Orders Placed This Encounter  Procedures   Large Joint Inj   XR C-ARM NO REPORT    Follow-up: Return if symptoms worsen or fail to improve.   Procedures: Large Joint Inj: R glenohumeral on 02/03/2021 3:00  PM Indications: pain and diagnostic evaluation Details: 22 G 3.5 in needle, fluoroscopy-guided anteromedial approach  Arthrogram: No  Medications: 40 mg triamcinolone acetonide 40 MG/ML; 5 mL bupivacaine 0.25 % Outcome: tolerated well, no immediate complications  There was excellent flow of contrast producing a partial arthrogram of the glenohumeral joint. The patient did have relief of symptoms during the anesthetic phase of the injection. Procedure, treatment alternatives, risks and benefits explained, specific risks discussed. Consent was given by the patient. Immediately prior to procedure a time out was called to verify the correct patient, procedure, equipment, support staff and site/side marked as required. Patient was prepped and draped in the usual sterile fashion.         Clinical History: MRI OF THE RIGHT SHOULDER WITHOUT CONTRAST   TECHNIQUE: Multiplanar, multisequence MR imaging of the shoulder was performed. No intravenous contrast was administered.   COMPARISON:  None.   FINDINGS: Rotator cuff: Moderate tendinosis of the supraspinatus tendon with a small partial-thickness articular surface tear. Mild tendinosis of the infraspinatus tendon. Teres minor tendon is intact. Partial-thickness tear of the peripheral fibers of the subscapularis tendon.   Muscles: No atrophy or abnormal signal of the muscles of the rotator cuff.   Biceps long head: Intact long head of the biceps tendon with medial subluxation.   Acromioclavicular Joint: Os acromiale. Mild arthropathy of the acromioclavicular joint. Trace subacromial/subdeltoid bursal fluid.  Glenohumeral Joint: Partial-thickness cartilage loss of the glenohumeral joint with areas of high-grade partial-thickness cartilage loss of the inferior glenoid with subchondral reactive marrow change. No significant joint effusion.   Labrum: Limited evaluation of the labrum secondary to lack of intra-articular contrast.  Posterosuperior labral degeneration.   Bones: No marrow abnormality, fracture, or dislocation.   Other: None.   IMPRESSION: 1. Moderate tendinosis of the supraspinatus tendon with a small partial-thickness articular surface tear. 2. Mild tendinosis of the infraspinatus tendon. 3. Partial-thickness tear of the peripheral fibers of the subscapularis tendon. 4. Intact long head of the biceps tendon with medial subluxation. 5. Partial-thickness cartilage loss of the glenohumeral joint with areas of high-grade partial-thickness cartilage loss of the inferior glenoid with subchondral reactive marrow change.     Electronically Signed   By: Elige Ko M.D.   On: 01/17/2021 09:45     Objective:  VS:  HT:    WT:   BMI:     BP:   HR: bpm  TEMP: ( )  RESP:  Physical Exam   Imaging: XR C-ARM NO REPORT  Result Date: 02/03/2021 Please see Notes tab for imaging impression.

## 2021-02-19 ENCOUNTER — Ambulatory Visit: Payer: Managed Care, Other (non HMO) | Admitting: Physical Medicine and Rehabilitation

## 2021-02-26 ENCOUNTER — Other Ambulatory Visit: Payer: Self-pay | Admitting: Family Medicine

## 2021-03-25 ENCOUNTER — Institutional Professional Consult (permissible substitution): Payer: Managed Care, Other (non HMO) | Admitting: Neurology

## 2021-04-09 ENCOUNTER — Ambulatory Visit (INDEPENDENT_AMBULATORY_CARE_PROVIDER_SITE_OTHER): Payer: 59 | Admitting: Family Medicine

## 2021-04-09 ENCOUNTER — Encounter: Payer: Self-pay | Admitting: Family Medicine

## 2021-04-09 VITALS — BP 111/68 | HR 102 | Temp 97.6°F | Ht 72.0 in | Wt 241.8 lb

## 2021-04-09 DIAGNOSIS — R7303 Prediabetes: Secondary | ICD-10-CM | POA: Diagnosis not present

## 2021-04-09 DIAGNOSIS — N5203 Combined arterial insufficiency and corporo-venous occlusive erectile dysfunction: Secondary | ICD-10-CM | POA: Diagnosis not present

## 2021-04-09 DIAGNOSIS — G540 Brachial plexus disorders: Secondary | ICD-10-CM | POA: Diagnosis not present

## 2021-04-09 DIAGNOSIS — E782 Mixed hyperlipidemia: Secondary | ICD-10-CM | POA: Diagnosis not present

## 2021-04-09 MED ORDER — ATORVASTATIN CALCIUM 40 MG PO TABS: 40.0000 mg | ORAL_TABLET | Freq: Every day | ORAL | 3 refills | Status: DC

## 2021-04-09 MED ORDER — SILDENAFIL CITRATE 100 MG PO TABS: 50.0000 mg | ORAL_TABLET | Freq: Every day | ORAL | 1 refills | Status: DC | PRN

## 2021-04-09 NOTE — Progress Notes (Signed)
Subjective:  Patient ID: Ryan Rojas, male    DOB: [birth year]  Age: 56 y.o. MRN: [id number]  CC: Medical Management of Chronic Issues   HPI Ryan Rojas presents for Pain and loss of extension of left hand. Right hand gets sensation of needles. Couldn't tolerate trileptal due to lethargy. Gaba stopped at 1800 mg due to being ineffective. Pt. Expresses frustration at continued pain, but no plan. He has undergone MRIs of each shoulder. Recently Neurology found brachial plexopathy on EMG of the left upper extremity.  The MRIs showed rotator cuff tears bilaterally as well.  Patient reports tingling and numbness of the fourth and fifth digits of the right hand with associated pain.  He has to manually extend his left hand because it will reflexively flex itself into a fist like position that is painful.  He frequently has to extend it to reduce the pain.  Depression screen [facility] 2/9 04/09/2021 10/07/2020 08/08/2020  Decreased Interest 0 0 0  Down, Depressed, Hopeless 0 0 0  PHQ - 2 Score 0 0 0  Altered sleeping - - 1  Tired, decreased energy - - 1  Change in appetite - - 0  Feeling bad or failure about yourself  - - 0  Trouble concentrating - - 0  Moving slowly or fidgety/restless - - 0  Suicidal thoughts - - 0  PHQ-9 Score - - 2  Difficult doing work/chores - - Not difficult at all    History Ryan Rojas has a past medical history of Hyperlipidemia.   He has no past surgical history on file.   His family history includes Asthma in his mother and sister; Diabetes in his sister.He reports that he has never smoked. He has never used smokeless tobacco. He reports current alcohol use of about 1.0 standard drink per week. He reports that he does not use drugs.    ROS Review of Systems  Constitutional:  Negative for fever.  Respiratory:  Negative for shortness of breath.   Cardiovascular:  Negative for chest pain.  Musculoskeletal:  Negative for arthralgias.  Skin:  Negative for rash.    Objective:  BP 111/68    Pulse (!) 102    Temp 97.6 F (36.4 C)    Ht 6' (1.829 m)    Wt 241 lb 12.8 oz (109.7 kg)    SpO2 97%    BMI 32.79 kg/m   BP Readings from Last 3 Encounters:  04/09/21 111/68  01/02/21 131/81  10/07/20 111/70    Wt Readings from Last 3 Encounters:  04/09/21 241 lb 12.8 oz (109.7 kg)  01/02/21 238 lb 9.6 oz (108.2 kg)  10/07/20 230 lb 3.2 oz (104.4 kg)     Physical Exam Vitals reviewed.  Constitutional:      Appearance: He is well-developed.  HENT:     Head: Normocephalic and atraumatic.     Right Ear: External ear normal.     Left Ear: External ear normal.     Mouth/Throat:     Pharynx: No oropharyngeal exudate or posterior oropharyngeal erythema.  Eyes:     Pupils: Pupils are equal, round, and reactive to light.  Cardiovascular:     Rate and Rhythm: Normal rate and regular rhythm.     Heart sounds: No murmur heard. Pulmonary:     Effort: No respiratory distress.     Breath sounds: Normal breath sounds.  Musculoskeletal:        General: Tenderness (both shoulders and hands) present.     Cervical  back: Normal range of motion and neck supple.  Neurological:     Mental Status: He is alert and oriented to person, place, and time.      Assessment & Plan:   Ryan Rojas was seen today for medical management of chronic issues.  Diagnoses and all orders for this visit:  Mixed hyperlipidemia -     Lipid panel -     atorvastatin (LIPITOR) 40 MG tablet; Take 1 tablet (40 mg total) by mouth daily.  Prediabetes -     Bayer DCA Hb A1c Waived -     CBC with Differential/Platelet -     CMP14+EGFR  Combined arterial insufficiency and corporo-venous occlusive erectile dysfunction -     sildenafil (VIAGRA) 100 MG tablet; Take 0.5-1 tablets (50-100 mg total) by mouth daily as needed for erectile dysfunction.  Brachial plexopathy       I have discontinued Ryan Rojas's gabapentin, OXcarbazepine, and meloxicam. I am also having him maintain his  atorvastatin and sildenafil.  Allergies as of 04/09/2021       Reactions   Hydrocodone Itching        Medication List        Accurate as of April 09, 2021  9:08 PM. If you have any questions, ask your nurse or doctor.          STOP taking these medications    gabapentin 300 MG capsule Commonly known as: NEURONTIN Stopped by: Claretta Fraise, MD   meloxicam 15 MG tablet Commonly known as: Mobic Stopped by: Claretta Fraise, MD   OXcarbazepine 150 MG tablet Commonly known as: Trileptal Stopped by: Claretta Fraise, MD       TAKE these medications    atorvastatin 40 MG tablet Commonly known as: LIPITOR Take 1 tablet (40 mg total) by mouth daily.   sildenafil 100 MG tablet Commonly known as: Viagra Take 0.5-1 tablets (50-100 mg total) by mouth daily as needed for erectile dysfunction.         Follow-up: No follow-ups on file.  Claretta Fraise, M.D.

## 2021-04-10 LAB — CMP14+EGFR
ALT: 19 IU/L (ref 0–44)
AST: 22 IU/L (ref 0–40)
Albumin/Globulin Ratio: 2 (ref 1.2–2.2)
Albumin: 4.7 g/dL (ref 3.8–4.9)
Alkaline Phosphatase: 85 IU/L (ref 44–121)
BUN/Creatinine Ratio: 9 (ref 9–20)
BUN: 10 mg/dL (ref 6–24)
Bilirubin Total: 0.2 mg/dL (ref 0.0–1.2)
CO2: 26 mmol/L (ref 20–29)
Calcium: 9 mg/dL (ref 8.7–10.2)
Chloride: 105 mmol/L (ref 96–106)
Creatinine, Ser: 1.07 mg/dL (ref 0.76–1.27)
Globulin, Total: 2.3 g/dL (ref 1.5–4.5)
Glucose: 97 mg/dL (ref 70–99)
Potassium: 4.3 mmol/L (ref 3.5–5.2)
Sodium: 144 mmol/L (ref 134–144)
Total Protein: 7 g/dL (ref 6.0–8.5)
eGFR: 83 mL/min/{1.73_m2} (ref >59)

## 2021-04-10 LAB — CBC WITH DIFFERENTIAL/PLATELET
Basophils Absolute: 0.1 10*3/uL (ref 0.0–0.2)
Basos: 1 %
EOS (ABSOLUTE): 0.3 10*3/uL (ref 0.0–0.4)
Eos: 4 %
Hematocrit: 42.7 % (ref 37.5–51.0)
Hemoglobin: 13.9 g/dL (ref 13.0–17.7)
Immature Grans (Abs): 0 10*3/uL (ref 0.0–0.1)
Immature Granulocytes: 0 %
Lymphocytes Absolute: 2.7 10*3/uL (ref 0.7–3.1)
Lymphs: 34 %
MCH: 27.1 pg (ref 26.6–33.0)
MCHC: 32.6 g/dL (ref 31.5–35.7)
MCV: 83 fL (ref 79–97)
Monocytes Absolute: 0.6 10*3/uL (ref 0.1–0.9)
Monocytes: 7 %
Neutrophils Absolute: 4.3 10*3/uL (ref 1.4–7.0)
Neutrophils: 54 %
Platelets: 264 10*3/uL (ref 150–450)
RBC: 5.13 x10E6/uL (ref 4.14–5.80)
RDW: 13.6 % (ref 11.6–15.4)
WBC: 8 10*3/uL (ref 3.4–10.8)

## 2021-04-10 LAB — LIPID PANEL
Chol/HDL Ratio: 3.5 ratio (ref 0.0–5.0)
Cholesterol, Total: 173 mg/dL (ref 100–199)
HDL: 50 mg/dL (ref >39)
LDL Chol Calc (NIH): 108 mg/dL — ABNORMAL HIGH (ref 0–99)
Triglycerides: 82 mg/dL (ref 0–149)
VLDL Cholesterol Cal: 15 mg/dL (ref 5–40)

## 2021-04-10 LAB — BAYER DCA HB A1C WAIVED: HB A1C (BAYER DCA - WAIVED): 5.9 % — ABNORMAL HIGH (ref 4.8–5.6)

## 2021-04-10 NOTE — Progress Notes (Signed)
Hello Ryan Rojas,  Your lab result is normal and/or stable.Some minor variations that are not significant are commonly marked abnormal, but do not represent any medical problem for you.  Best regards, Jama Krichbaum, M.D.

## 2021-04-22 NOTE — Progress Notes (Deleted)
PATIENT: Ryan Rojas DOB: 06/20/1965  REASON FOR VISIT: Follow up HISTORY FROM: Patient PRIMARY NEUROLOGIST:   HISTORY OF PRESENT ILLNESS: Today 04/22/21  HISTORY    Ryan Rojas is a 56 year old left handed male, seen in request by orthopedic surgeon Dr. Roda Shutters, Coralyn Mark and his primary care physician Stacks, Broadus John, for evaluation of subacute onset left arm pain, left anterior chest muscle pain, initial evaluation was on September 17, 2020.   I reviewed and summarized the referring note. PMHX HLD,  Low back pain,    He works as Armed forces technical officer, spent most of the time writing, working on [employer], he woke up 1 day in the middle of May 2022, noticed numbness tingling painful sensation at the left ulnar forearm, extending to left fourth and the fifth fingers,  His symptoms continue to progress in the following days, later on the neuropathic pain spread to left arm involving left armpit, left anterior chest, he described significant sharp shooting pain, worry about the heart attack, he presented to local emergency room, there was no cardiac etiology found to explain his left chest inner arm discomfort  About a week later, he began to noticed left hand weakness, difficulty to extend his left lateral to 3 fingers, weak grip,   Over the past couple months, his significant pain has much improved, but he still has intermittent flareup of the pain, especially when he coughs, moves suddenly, he would have such radiating pain at the left armpit to his left anterior chest, left ulnar arm and forearm, he continues to have weakness of left hands, mild improvement with Lyrica 200 mg daily  He denies gait abnormality, he denies bowel bladder incontinence, he denies right upper extremity involvement   UPDATE October 23 2020: Patient is accompanied by his wife, return for electrodiagnostic study today, which confirmed diagnosis of left lower brachial plexopathy, mainly involving C8-T1, with minor  involvement of C7,   Over the past few months, his pain has improved, today rated 3 out of 10, over left chest, ulnar arm, he continue have significant weakness, especially with finger abduction, extension,   In July, he complained of significant neuropathic pain, we have made further adjustment of his neuropathic medications, including Trileptal, gabapentin, he is no longer taking it, was recently started on second round of prednisone tapering  MRI of left shoulder showed full-thickness incomplete tearing of the anterior supraspinatus tendon insertion with up to 7 mm of tendon retraction, long head biceps tendon is medially perched at the group entry zone with focal tendinosis, above findings would not explain his current left arm weakness and sensory loss  Update January 02, 2021 His left shoulder pain has much improved, but since the end of October 2022, he has developed right shoulder pain, this is different from his left shoulder aching pain, right shoulder pain is induced by certain movements of his right shoulder, not getting better over the past few months, he also complains of left ankle pain,  Continue have significant left hand weakness   He also complains of difficulty sleeping, dry mouth, catching for air, very narrow oropharyngeal space  Update April 23, 2021 SS:   REVIEW OF SYSTEMS: Out of a complete 14 system review of symptoms, the patient complains only of the following symptoms, and all other reviewed systems are negative.  ALLERGIES: Allergies  Allergen Reactions   Hydrocodone Itching    HOME MEDICATIONS: Outpatient Medications Prior to Visit  Medication Sig Dispense Refill   atorvastatin (LIPITOR) 40 MG tablet  Take 1 tablet (40 mg total) by mouth daily. 90 tablet 3   sildenafil (VIAGRA) 100 MG tablet Take 0.5-1 tablets (50-100 mg total) by mouth daily as needed for erectile dysfunction. 24 tablet 1   No facility-administered medications prior to visit.    PAST  MEDICAL HISTORY: Past Medical History:  Diagnosis Date   Hyperlipidemia     PAST SURGICAL HISTORY: No past surgical history on file.  FAMILY HISTORY: Family History  Problem Relation Age of Onset   Asthma Mother    Diabetes Sister    Asthma Sister     SOCIAL HISTORY: Social History   Socioeconomic History   Marital status: Single    Spouse name: Not on file   Number of children: Not on file   Years of education: Not on file   Highest education level: Not on file  Occupational History   Not on file  Tobacco Use   Smoking status: Never   Smokeless tobacco: Never  Vaping Use   Vaping Use: Never used  Substance and Sexual Activity   Alcohol use: Yes    Alcohol/week: 1.0 standard drink    Types: 1 Cans of beer per week   Drug use: No   Sexual activity: Yes    Birth control/protection: None  Other Topics Concern   Not on file  Social History Narrative   Not on file   Social Determinants of Health   Financial Resource Strain: Not on file  Food Insecurity: Not on file  Transportation Needs: Not on file  Physical Activity: Not on file  Stress: Not on file  Social Connections: Not on file  Intimate Partner Violence: Not on file      PHYSICAL EXAM  There were no vitals filed for this visit. There is no height or weight on file to calculate BMI.  Generalized: Well developed, in no acute distress   Neurological examination  Mentation: Alert oriented to time, place, history taking. Follows all commands speech and language fluent Cranial nerve II-XII: Pupils were equal round reactive to light. Extraocular movements were full, visual field were full on confrontational test. Facial sensation and strength were normal. Uvula tongue midline. Head turning and shoulder shrug  were normal and symmetric. Motor: The motor testing reveals 5 over 5 strength of all 4 extremities. Good symmetric motor tone is noted throughout.  Sensory: Sensory testing is intact to soft touch  on all 4 extremities. No evidence of extinction is noted.  Coordination: Cerebellar testing reveals good finger-nose-finger and heel-to-shin bilaterally.  Gait and station: Gait is normal. Tandem gait is normal. Romberg is negative. No drift is seen.  Reflexes: Deep tendon reflexes are symmetric and normal bilaterally.   DIAGNOSTIC DATA (LABS, IMAGING, TESTING) - I reviewed patient records, labs, notes, testing and imaging myself where available.  Lab Results  Component Value Date   WBC 8.0 04/09/2021   HGB 13.9 04/09/2021   HCT 42.7 04/09/2021   MCV 83 04/09/2021   PLT 264 04/09/2021      Component Value Date/Time   NA 144 04/09/2021 1619   K 4.3 04/09/2021 1619   CL 105 04/09/2021 1619   CO2 26 04/09/2021 1619   GLUCOSE 97 04/09/2021 1619   BUN 10 04/09/2021 1619   CREATININE 1.07 04/09/2021 1619   CALCIUM 9.0 04/09/2021 1619   PROT 7.0 04/09/2021 1619   ALBUMIN 4.7 04/09/2021 1619   AST 22 04/09/2021 1619   ALT 19 04/09/2021 1619   ALKPHOS 85 04/09/2021 1619  BILITOT 0.2 04/09/2021 1619   GFRNONAA 78 09/27/2019 0914   GFRAA 90 09/27/2019 0914   Lab Results  Component Value Date   CHOL 173 04/09/2021   HDL 50 04/09/2021   LDLCALC 108 (H) 04/09/2021   TRIG 82 04/09/2021   CHOLHDL 3.5 04/09/2021   Lab Results  Component Value Date   HGBA1C 5.9 (H) 04/09/2021   Lab Results  Component Value Date   VITAMINB12 742 09/17/2020   Lab Results  Component Value Date   TSH 1.210 09/17/2020      ASSESSMENT AND PLAN 56 y.o. year old male       Otila Kluver, DNP 04/22/2021, 4:28 PM [facility] [address], [address] [place], [place] [address] [phone]

## 2021-04-23 ENCOUNTER — Ambulatory Visit: Payer: 59 | Admitting: Neurology

## 2021-05-16 ENCOUNTER — Other Ambulatory Visit: Payer: Self-pay | Admitting: Family Medicine

## 2021-05-16 DIAGNOSIS — N5203 Combined arterial insufficiency and corporo-venous occlusive erectile dysfunction: Secondary | ICD-10-CM

## 2021-05-16 NOTE — Telephone Encounter (Signed)
Last office visit 04/09/21 ?Last refill 04/09/21, #24, 1 refill ?

## 2021-05-20 ENCOUNTER — Encounter: Payer: Self-pay | Admitting: Family Medicine

## 2021-05-28 ENCOUNTER — Other Ambulatory Visit: Payer: Self-pay

## 2021-05-28 ENCOUNTER — Encounter: Payer: Self-pay | Admitting: Orthopaedic Surgery

## 2021-05-28 ENCOUNTER — Ambulatory Visit (INDEPENDENT_AMBULATORY_CARE_PROVIDER_SITE_OTHER): Payer: 59 | Admitting: Orthopaedic Surgery

## 2021-05-28 DIAGNOSIS — M67922 Unspecified disorder of synovium and tendon, left upper arm: Secondary | ICD-10-CM | POA: Diagnosis not present

## 2021-05-28 DIAGNOSIS — M7542 Impingement syndrome of left shoulder: Secondary | ICD-10-CM

## 2021-05-28 DIAGNOSIS — G8929 Other chronic pain: Secondary | ICD-10-CM

## 2021-05-28 DIAGNOSIS — M25511 Pain in right shoulder: Secondary | ICD-10-CM

## 2021-05-28 DIAGNOSIS — M75122 Complete rotator cuff tear or rupture of left shoulder, not specified as traumatic: Secondary | ICD-10-CM | POA: Diagnosis not present

## 2021-05-28 NOTE — Progress Notes (Signed)
? ?Office Visit Note ?  ?Patient: Ryan Rojas           ?Date of Birth: 05/12/1965           ?MRN: [id number] ?Visit Date: 05/28/2021 ?             ?Requested by: Mechele Claude, MD ?[address] [place],  [place] [address] ?PCP: Mechele Claude, MD ? ? ?Assessment & Plan: ?Visit Diagnoses:  ?1. Nontraumatic complete tear of left rotator cuff   ?2. Impingement syndrome of left shoulder   ?3. Tendinopathy of left biceps tendon   ?4. Chronic right shoulder pain   ? ? ?Plan: Impression is acute left rotator cuff tear.  At this time he is no longer receiving any relief from conservative management and has elected to move forward with arthroscopic rotator cuff repair.  I reviewed the MRI from January 2022 with the patient and his wife in detail.  We will also plan to do a biceps tenodesis as well as extensive debridement subacromial decompression.  For the right shoulder we will set him up with a glenohumeral injection to give him some relief for now. ? ?Follow-Up Instructions: No follow-ups on file.  ? ?Orders:  ?No orders of the defined types were placed in this encounter. ? ?No orders of the defined types were placed in this encounter. ? ? ? ? Procedures: ?No procedures performed ? ? ?Clinical Data: ?No additional findings. ? ? ?Subjective: ?Chief Complaint  ?Patient presents with  ? Right Shoulder - Pain  ? ? ?HPI ? ?[name] returns today for bilateral shoulder pain.  Overall the pain is getting worse.  He had a previous right shoulder glenohumeral injection which helped temporarily.  He is left-handed and has had to compensate with his right upper extremity for the left shoulder pain.  He did have an MRI in July 2022 which showed full-thickness supraspinatus tear.  He elected to treat this nonsurgically at the time.  He states that unfortunately the symptoms have gotten worse and is affecting his ADLs. ? ?Review of Systems  ?Constitutional: Negative.   ?All other systems reviewed and are negative. ? ? ?Objective: ?Vital  Signs: There were no vitals taken for this visit. ? ?Physical Exam ?Vitals and nursing note reviewed.  ?Constitutional:   ?   Appearance: He is well-developed.  ?Pulmonary:  ?   Effort: Pulmonary effort is normal.  ?Abdominal:  ?   Palpations: Abdomen is soft.  ?Skin: ?   General: Skin is warm.  ?Neurological:  ?   Mental Status: He is alert and oriented to person, place, and time.  ?Psychiatric:     ?   Behavior: Behavior normal.     ?   Thought Content: Thought content normal.     ?   Judgment: Judgment normal.  ? ? ?Ortho Exam ? ?Examination of the left shoulder shows pain with testing of the rotator cuff and impingement and biceps tendon.  Range of motion is well-preserved. ? ?Examination of the right shoulder shows well-preserved range of motion.  Mild pain with testing of rotator cuff impingement and biceps tendon. ? ?Specialty Comments:  ?No specialty comments available. ? ?Imaging: ?No results found. ? ? ?PMFS History: ?Patient Active Problem List  ? Diagnosis Date Noted  ? Nontraumatic complete tear of left rotator cuff 05/28/2021  ? Impingement syndrome of left shoulder 05/28/2021  ? Tendinopathy of left biceps tendon 05/28/2021  ? Arm paresthesia, left 01/02/2021  ? Chronic right shoulder pain 01/02/2021  ?  Other fatigue 01/02/2021  ? 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  ? Neuropathic pain 09/30/2020  ? Left arm weakness 09/17/2020  ? Left arm pain 09/17/2020  ? Neck pain on left side 09/17/2020  ? Cubital tunnel syndrome on left 08/16/2020  ? Intractable neuropathic pain of lumbosacral origin 05/20/2020  ? Combined arterial insufficiency and corporo-venous occlusive erectile dysfunction 09/27/2019  ? 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  ? Impotence 01/18/2018  ? Hyperlipidemia 07/16/2017  ? ?Past Medical History:  ?Diagnosis Date  ? Hyperlipidemia   ?  ?Family History  ?Problem Relation Age of Onset  ? Asthma Mother   ? Diabetes Sister   ? Asthma Sister   ?  ?History reviewed. No pertinent surgical history. ?Social  History  ? ?Occupational History  ? Not on file  ?Tobacco Use  ? Smoking status: Never  ? Smokeless tobacco: Never  ?Vaping Use  ? Vaping Use: Never used  ?Substance and Sexual Activity  ? Alcohol use: Yes  ?  Alcohol/week: 1.0 standard drink  ?  Types: 1 Cans of beer per week  ? Drug use: No  ? Sexual activity: Yes  ?  Birth control/protection: None  ? ? ? ? ? ? ?

## 2021-06-26 ENCOUNTER — Other Ambulatory Visit: Payer: Self-pay | Admitting: Physician Assistant

## 2021-06-26 MED ORDER — ONDANSETRON HCL 4 MG PO TABS: 4.0000 mg | ORAL_TABLET | Freq: Three times a day (TID) | ORAL | 0 refills | Status: DC | PRN

## 2021-06-26 MED ORDER — OXYCODONE-ACETAMINOPHEN 5-325 MG PO TABS
1.0000 | ORAL_TABLET | Freq: Four times a day (QID) | ORAL | 0 refills | Status: DC | PRN
Start: 2021-06-26 — End: 2021-07-09

## 2021-06-30 ENCOUNTER — Ambulatory Visit (INDEPENDENT_AMBULATORY_CARE_PROVIDER_SITE_OTHER): Payer: 59 | Admitting: Physical Medicine and Rehabilitation

## 2021-06-30 ENCOUNTER — Encounter: Payer: Self-pay | Admitting: Physical Medicine and Rehabilitation

## 2021-06-30 ENCOUNTER — Ambulatory Visit: Payer: Self-pay

## 2021-06-30 DIAGNOSIS — G8929 Other chronic pain: Secondary | ICD-10-CM

## 2021-06-30 DIAGNOSIS — M25511 Pain in right shoulder: Secondary | ICD-10-CM | POA: Diagnosis not present

## 2021-06-30 MED ORDER — BUPIVACAINE HCL 0.25 % IJ SOLN: 4.0000 mL | INTRAMUSCULAR | Status: DC | PRN

## 2021-06-30 MED ORDER — TRIAMCINOLONE ACETONIDE 40 MG/ML IJ SUSP: 60.0000 mg | INTRAMUSCULAR | Status: DC | PRN

## 2021-06-30 NOTE — Progress Notes (Signed)
Pt state right shoulder pain. Pt state any movement with his right arm makes the pain worse. Pt state he uses heat and a braces to help ease his pain. ? ?Numeric Pain Rating Scale and Functional Assessment ?Average Pain 4 ? ? ?In the last MONTH (on 0-10 scale) has pain interfered with the following? ? ?1. General activity like being  able to carry out your everyday physical activities such as walking, climbing stairs, carrying groceries, or moving a chair?  ?Rating(8) ? ? ?-BT, -Dye Allergies. ? ?

## 2021-06-30 NOTE — Addendum Note (Signed)
Addended by: Ashok Norris on: 06/30/2021 08:36 AM ? ? Modules accepted: Level of Service ? ?

## 2021-06-30 NOTE — Progress Notes (Addendum)
? ?Ryan Rojas - 56 y.o. male MRN [id number]  Date of birth: 02-05-1966 ? ?Office Visit Note: ?Visit Date: 06/30/2021 ?PCP: Mechele Claude, MD ?Referred by: Mechele Claude, MD ? ?Subjective: ?Chief Complaint  ?Patient presents with  ? Right Shoulder - Pain  ? ?HPI:  Ryan Rojas is a 56 y.o. male who comes in today at the request of Dr. Glee Arvin for planned Right anesthetic glenohumeral arthrogram with fluoroscopic guidance.  The patient has failed conservative care including home exercise, medications, time and activity modification.  This injection will be diagnostic and hopefully therapeutic.  Please see requesting physician notes for further details and justification.  After speaking with Mr. Mehrer he reports that the last injection in November gave him only about a week of relief.  He does report the right side is hurting worse than the left he is scheduled for surgery on the left.  At this point after talking with him he decided to hold off on the injection today Lajoyce Corners the fact that he is really just been dealing with that and really does not want to necessarily do anything unnecessary.  I did tell him that anytime he changes or if he wanted to complete the injection just to give Ryan Rojas a call.  Otherwise he will follow-up with Dr. Raynald Kemp. ? ?Review of Systems  ?Musculoskeletal:  Positive for joint pain.  ?All other systems reviewed and are negative. Otherwise per HPI. ? ?Assessment & Plan: ?Visit Diagnoses:  ?  ICD-10-CM   ?1. Chronic right shoulder pain  M25.511 Large Joint Inj: R hip joint  ? G89.29 XR C-ARM NO REPORT  ?  bupivacaine (MARCAINE) 0.25 % (with pres) injection 4 mL  ?  triamcinolone acetonide (KENALOG-40) injection 60 mg  ?  ?  ?Plan: Findings:  ?Chronic bilateral shoulder pain on the care of Dr. Glee Arvin.  Decided not to do the injection today on the right we will go ahead with procedure on the left with Dr. Roda Shutters.  He can call Ryan Rojas anytime for repeat injection on the right.  ? ?Meds & Orders:  ?Meds  ordered this encounter  ?Medications  ? bupivacaine (MARCAINE) 0.25 % (with pres) injection 4 mL  ? triamcinolone acetonide (KENALOG-40) injection 60 mg  ?  ?Orders Placed This Encounter  ?Procedures  ? Large Joint Inj: R hip joint  ? XR C-ARM NO REPORT  ?  ?Follow-up: Return if symptoms worsen or fail to improve.  ? ?Procedures: ?No procedures performed  ?   ? ?Clinical History: ?No specialty comments available.  ? ? ? ?Objective:  VS:  HT:    WT:   BMI:     BP:   HR: bpm  TEMP: ( )  RESP:  ?Physical Exam ?Vitals and nursing note reviewed.  ?Constitutional:   ?   General: He is not in acute distress. ?   Appearance: Normal appearance. He is well-developed.  ?HENT:  ?   Head: Normocephalic and atraumatic.  ?Eyes:  ?   Conjunctiva/sclera: Conjunctivae normal.  ?   Pupils: Pupils are equal, round, and reactive to light.  ?Cardiovascular:  ?   Rate and Rhythm: Normal rate.  ?   Pulses: Normal pulses.  ?   Heart sounds: Normal heart sounds.  ?Pulmonary:  ?   Effort: Pulmonary effort is normal. No respiratory distress.  ?Musculoskeletal:  ?   Cervical back: Normal range of motion and neck supple. No rigidity.  ?   Right lower leg: No edema.  ?  Left lower leg: No edema.  ?   Comments: Painful range of motion both shoulders.  ?Skin: ?   General: Skin is warm and dry.  ?   Findings: No erythema or rash.  ?Neurological:  ?   General: No focal deficit present.  ?   Mental Status: He is alert and oriented to person, place, and time.  ?   Sensory: No sensory deficit.  ?   Coordination: Coordination normal.  ?   Gait: Gait normal.  ?Psychiatric:     ?   Mood and Affect: Mood normal.     ?   Behavior: Behavior normal.  ?  ? ?Imaging: ?No results found. ?

## 2021-07-01 ENCOUNTER — Ambulatory Visit: Payer: 59 | Admitting: Physical Medicine and Rehabilitation

## 2021-07-01 ENCOUNTER — Telehealth: Payer: Self-pay | Admitting: Orthopaedic Surgery

## 2021-07-01 NOTE — Telephone Encounter (Signed)
Received $25.00 cash,medical records release form and disability paperwork from patient/Forwarding to [employer] today  

## 2021-07-03 ENCOUNTER — Encounter: Payer: Self-pay | Admitting: Orthopaedic Surgery

## 2021-07-03 DIAGNOSIS — M7522 Bicipital tendinitis, left shoulder: Secondary | ICD-10-CM | POA: Diagnosis not present

## 2021-07-03 DIAGNOSIS — M75122 Complete rotator cuff tear or rupture of left shoulder, not specified as traumatic: Secondary | ICD-10-CM | POA: Diagnosis not present

## 2021-07-07 ENCOUNTER — Other Ambulatory Visit: Payer: Self-pay | Admitting: Physician Assistant

## 2021-07-07 ENCOUNTER — Telehealth: Payer: Self-pay | Admitting: Orthopaedic Surgery

## 2021-07-07 ENCOUNTER — Ambulatory Visit: Payer: 59 | Admitting: Physical Medicine and Rehabilitation

## 2021-07-07 MED ORDER — HYDROMORPHONE HCL 2 MG PO TABS
2.0000 mg | ORAL_TABLET | Freq: Four times a day (QID) | ORAL | 0 refills | Status: DC | PRN
Start: 2021-07-07 — End: 2021-09-11

## 2021-07-07 NOTE — Telephone Encounter (Signed)
Pt called and states that he is allergic to oxycodone. He would like someone to call something else in for his pain.  ? ?CB [phone]  ?

## 2021-07-07 NOTE — Telephone Encounter (Signed)
Sent in

## 2021-07-08 ENCOUNTER — Telehealth: Payer: Self-pay | Admitting: Orthopaedic Surgery

## 2021-07-08 NOTE — Telephone Encounter (Signed)
The new prescription that was sent in from [place] was not approved by his insurance. Ryan Rojas would like to speak to someone about this. Please advise. ?

## 2021-07-09 ENCOUNTER — Other Ambulatory Visit: Payer: Self-pay | Admitting: Physician Assistant

## 2021-07-09 MED ORDER — OXYCODONE-ACETAMINOPHEN 7.5-325 MG PO TABS
ORAL_TABLET | ORAL | 0 refills | Status: DC
Start: 2021-07-09 — End: 2021-09-11

## 2021-07-09 MED ORDER — TRAMADOL HCL 50 MG PO TABS: 50.0000 mg | ORAL_TABLET | Freq: Every day | ORAL | 0 refills | Status: DC | PRN

## 2021-07-09 NOTE — Telephone Encounter (Signed)
I advised patient Rx tramadol Sent into pharm. He laughed and said ok thank you. ? ? ?

## 2021-07-09 NOTE — Telephone Encounter (Signed)
Called patient back to advise. Patient is very upset states he is Allergic to any Hydrcodone/Oxycodone (anything in the family-makes him itch). States he has expressed this several times. I did apologize and added oxy to his allergies as we only has hydrocodone on there. And advised him I would send msg to Dr. Roda Shutters ? ? ?Taking Ibuprofen and is not helping... Please send in something else into pharm. ? ? ? ?CB [phone] ? ?

## 2021-07-09 NOTE — Telephone Encounter (Signed)
Can you send a different Rx? ? ?

## 2021-07-09 NOTE — Telephone Encounter (Signed)
Sent in percocet 7.5 to be taken INSTEAD of percocet 5 that he has

## 2021-07-09 NOTE — Telephone Encounter (Signed)
I sent tramadol

## 2021-07-10 ENCOUNTER — Encounter: Payer: Self-pay | Admitting: Orthopaedic Surgery

## 2021-07-10 ENCOUNTER — Ambulatory Visit (INDEPENDENT_AMBULATORY_CARE_PROVIDER_SITE_OTHER): Payer: 59 | Admitting: Orthopaedic Surgery

## 2021-07-10 DIAGNOSIS — M75122 Complete rotator cuff tear or rupture of left shoulder, not specified as traumatic: Secondary | ICD-10-CM

## 2021-07-10 DIAGNOSIS — M7542 Impingement syndrome of left shoulder: Secondary | ICD-10-CM

## 2021-07-10 DIAGNOSIS — M67922 Unspecified disorder of synovium and tendon, left upper arm: Secondary | ICD-10-CM

## 2021-07-10 HISTORY — PX: ROTATOR CUFF REPAIR: SHX139

## 2021-07-10 MED ORDER — HYDROMORPHONE HCL 2 MG PO TABS
2.0000 mg | ORAL_TABLET | Freq: Every evening | ORAL | 0 refills | Status: DC | PRN
Start: 2021-07-10 — End: 2021-12-17

## 2021-07-10 NOTE — Progress Notes (Signed)
? ?  Post-Op Visit Note ?  ?Patient: Ryan Rojas           ?Date of Birth: Nov 25, 1965           ?MRN: [id number] ?Visit Date: 07/10/2021 ?PCP: Mechele Claude, MD ? ? ?Assessment & Plan: ? ?Chief Complaint:  ?Chief Complaint  ?Patient presents with  ? Left Shoulder - Routine Post Op  ?  Shoulder scope 07/03/21  ? ?Visit Diagnoses:  ?1. Nontraumatic complete tear of left rotator cuff   ?2. Impingement syndrome of left shoulder   ?3. Tendinopathy of left biceps tendon   ? ? ?Plan: Ryan Rojas is 1 week status post left rotator cuff repair and biceps tenodesis.  He gets severe itching with oxycodone and hydrocodone.  Tramadol does not help with the pain.  He has been taking Tylenol and Advil to help dull the pain.  He has been compliant with the shoulder sling.  Mainly has trouble sleeping at night due to the pain. ? ?Examination of left shoulder shows healed surgical incisions.  No signs of infection.  Expected postoperative swelling.  No neurovascular compromise. ? ?I sent in Dilaudid for him to take at night which will hopefully help with the pain and not cause any reactions.  We remove the sutures in place Steri-Strips.  I placed an order for outpatient PT in [place].  Follow-up in 5 weeks for recheck. ? ?Follow-Up Instructions: Return in about 5 weeks (around 08/14/2021).  ? ?Orders:  ?Orders Placed This Encounter  ?Procedures  ? Ambulatory referral to Physical Therapy  ? ?Meds ordered this encounter  ?Medications  ? HYDROmorphone (DILAUDID) 2 MG tablet  ?  Sig: Take 1 tablet (2 mg total) by mouth at bedtime as needed for severe pain.  ?  Dispense:  20 tablet  ?  Refill:  0  ? ? ?Imaging: ?No results found. ? ?PMFS History: ?Patient Active Problem List  ? Diagnosis Date Noted  ? Nontraumatic complete tear of left rotator cuff 05/28/2021  ? Impingement syndrome of left shoulder 05/28/2021  ? Tendinopathy of left biceps tendon 05/28/2021  ? Arm paresthesia, left 01/02/2021  ? Chronic right shoulder pain 01/02/2021  ? Other  fatigue 01/02/2021  ? 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  ? Neuropathic pain 09/30/2020  ? Left arm weakness 09/17/2020  ? Left arm pain 09/17/2020  ? Neck pain on left side 09/17/2020  ? Cubital tunnel syndrome on left 08/16/2020  ? Intractable neuropathic pain of lumbosacral origin 05/20/2020  ? Combined arterial insufficiency and corporo-venous occlusive erectile dysfunction 09/27/2019  ? 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  ? Impotence 01/18/2018  ? Hyperlipidemia 07/16/2017  ? ?Past Medical History:  ?Diagnosis Date  ? Hyperlipidemia   ?  ?Family History  ?Problem Relation Age of Onset  ? Asthma Mother   ? Diabetes Sister   ? Asthma Sister   ?  ?History reviewed. No pertinent surgical history. ?Social History  ? ?Occupational History  ? Not on file  ?Tobacco Use  ? Smoking status: Never  ? Smokeless tobacco: Never  ?Vaping Use  ? Vaping Use: Never used  ?Substance and Sexual Activity  ? Alcohol use: Yes  ?  Alcohol/week: 1.0 standard drink  ?  Types: 1 Cans of beer per week  ? Drug use: No  ? Sexual activity: Yes  ?  Birth control/protection: None  ? ? ? ?

## 2021-07-18 ENCOUNTER — Ambulatory Visit: Payer: 59 | Attending: Orthopaedic Surgery

## 2021-07-18 DIAGNOSIS — M25612 Stiffness of left shoulder, not elsewhere classified: Secondary | ICD-10-CM | POA: Diagnosis present

## 2021-07-18 DIAGNOSIS — M75122 Complete rotator cuff tear or rupture of left shoulder, not specified as traumatic: Secondary | ICD-10-CM | POA: Diagnosis not present

## 2021-07-18 DIAGNOSIS — M67922 Unspecified disorder of synovium and tendon, left upper arm: Secondary | ICD-10-CM | POA: Insufficient documentation

## 2021-07-18 DIAGNOSIS — M25512 Pain in left shoulder: Secondary | ICD-10-CM | POA: Diagnosis present

## 2021-07-18 DIAGNOSIS — M7542 Impingement syndrome of left shoulder: Secondary | ICD-10-CM | POA: Diagnosis not present

## 2021-07-18 NOTE — Therapy (Signed)
[facility] ?[place] ?[place], [place], [address] ?Phone: [phone]   Fax:  [phone] ? ?Physical Therapy Evaluation ? ?Patient Details  ?Name: Ryan Rojas ?MRN: [id number] ?Date of Birth: 1966/02/14 ?Referring Provider (PT): Erlinda Hong, MD ? ? ?Encounter Date: 07/18/2021 ? ? PT End of Session - 07/18/21 [id number]   ? ? Visit Number 1   ? Number of Visits 20   ? Date for PT Re-Evaluation 10/10/21   ? PT Start Time [id number]   ? PT Stop Time 0909   ? PT Time Calculation (min) 46 min   ? Activity Tolerance Patient tolerated treatment well   ? Behavior During Therapy [facility] for tasks assessed/performed   ? ?  ?  ? ?  ? ? ?Past Medical History:  ?Diagnosis Date  ? Hyperlipidemia   ? ? ?History reviewed. No pertinent surgical history. ? ?There were no vitals filed for this visit. ? ? ? Subjective Assessment - 07/18/21 0822   ? ? Subjective Patient reports that he had a left rotator cuff repair on 07/10/21. He notes that his shoulder has been waking him up around 2:30- 3 AM every morning. He is able to take his sling off when he is in the shower and sitting to relax his elbow. He has not tried moving his arm any at this time. He is left handed. He was told that he may require a repair of the right rotator cuff after his left shoulder heals.   ? Limitations House hold activities;Lifting   ? Patient Stated Goals use left arm, work on his car, yardwork,   ? Currently in Pain? Yes   ? Pain Score 7    ? Pain Location Shoulder   ? Pain Orientation Left;Anterior;Lateral   ? Pain Descriptors / Indicators Throbbing;Sore   ? Pain Type Surgical pain   ? Pain Radiating Towards none   ? Pain Onset 1 to 4 weeks ago   ? Pain Frequency Constant   ? Aggravating Factors  sleeping   ? Pain Relieving Factors none   ? Effect of Pain on Daily Activities unable to use his left arm   ? ?  ?  ? ?  ? ? ? ? ? [facility] PT Assessment - 07/18/21 0001   ? ?  ? Assessment  ? Medical Diagnosis Nontraumatic complete tear of left  rotator cuff   ? Referring Provider (PT) Erlinda Hong, MD   ? Onset Date/Surgical Date 07/10/21   ? Hand Dominance Left   ? Next MD Visit 08/15/21   ? Prior Therapy No   ?  ? Precautions  ? Precautions Shoulder   ? Type of Shoulder Precautions Left RTC repair   ? Shoulder Interventions Shoulder sling/immobilizer;Shoulder abduction pillow;At all times   ?  ? Restrictions  ? Weight Bearing Restrictions No   ?  ? Balance Screen  ? Has the patient fallen in the past 6 months No   ? Has the patient had a decrease in activity level because of a fear of falling?  No   ? Is the patient reluctant to leave their home because of a fear of falling?  No   ?  ? Prior Function  ? Level of Independence Independent   ? Vocation Full time employment   ? Vocation Requirements move up to 100+ pounds   ? Leisure working on his car, riding his motorcycle, yardwork   ?  ? Cognition  ? Overall Cognitive Status Within Functional Limits for  tasks assessed   ? Memory Appears intact   ? Awareness Appears intact   ? Problem Solving Appears intact   ?  ? Observation/Other Assessments  ? Focus on Therapeutic Outcomes (FOTO)  4.31   ?  ? Sensation  ? Additional Comments Patient reports numbness and tingling in his left hand, but this was happening prior to surgery   ?  ? ROM / Strength  ? AROM / PROM / Strength AROM;PROM   ?  ? AROM  ? AROM Assessment Site Shoulder   ? Right/Left Shoulder Right   ? Right Shoulder Flexion 151 Degrees   ? Right Shoulder ABduction 146 Degrees   ? Right Shoulder Internal Rotation --   to T11  ? Right Shoulder External Rotation --   to T2  ?  ? PROM  ? Overall PROM Comments Increased pain returning to neutral   ? PROM Assessment Site Shoulder   ? Right/Left Shoulder Left   ? Left Shoulder Flexion 45 Degrees   limited by pain  ? Left Shoulder ABduction 45 Degrees   limited by pain  ? Left Shoulder Internal Rotation --   to abdomen  ? Left Shoulder External Rotation 15 Degrees   limited by pain  ?  ? Palpation  ? Palpation comment  TTP: left  supraspinatus, upper trapezius, biceps, deltoid, pec major and minor   ? ?  ?  ? ?  ? ? ? ? ? ? ? ? ? ? ? ? ? ?Objective measurements completed on examination: See above findings.  ? ? ? ? ? [facility] Adult PT Treatment/Exercise - 07/18/21 0001   ? ?  ? Modalities  ? Modalities Vasopneumatic   ?  ? Vasopneumatic  ? Number Minutes Vasopneumatic  15 minutes   ? Vasopnuematic Location  Shoulder   ? Vasopneumatic Pressure Low   ? Vasopneumatic Temperature  34   ? ?  ?  ? ?  ? ? ? ? ? ? ? ? ? ? ? ? PT Short Term Goals - 07/18/21 0925   ? ?  ? PT SHORT TERM GOAL #1  ? Title Patient will be able to achieve at least 120 degrees of passive left shoulder flexion for improved shoulder moiblity.   ? Time 6   ? Period Weeks   ? Status New   ? Target Date 08/29/21   ?  ? PT SHORT TERM GOAL #2  ? Title Patient will be able to achieve at least 120 degrees of passive left shoulder abduction for improved shoulder moiblity.   ? Time 6   ? Period Weeks   ? Status New   ? Target Date 08/29/21   ? ?  ?  ? ?  ? ? ? ? PT Long Term Goals - 07/18/21 0920   ? ?  ? PT LONG TERM GOAL #1  ? Title Patient will be independent with his HEP.   ? Time 10   ? Period Weeks   ? Status New   ? Target Date 09/26/21   ?  ? PT LONG TERM GOAL #2  ? Title Patient will be able to demonstrate at least 120 degrees of active left shoulder flexion for improved function with overhead activities.   ? Time 10   ? Period Weeks   ? Status New   ? Target Date 09/26/21   ?  ? PT LONG TERM GOAL #3  ? Title Patient will be able to demonstrate at least 120  degrees of active left shoulder abduction for improved function reaching overhead.   ? Time 10   ? Period Weeks   ? Status New   ? Target Date 09/26/21   ?  ? PT LONG TERM GOAL #4  ? Title Patient will be able to lift at least 10 pounds overhead for improved function with household activities.   ? Time 10   ? Period Weeks   ? Status New   ? Target Date 09/26/01   ? ?  ?  ? ?  ? ? ? ? ? ? ? ? ? Plan - 07/18/21 0905    ? ? Clinical Impression Statement Patient is a 56 year old male presenting to physical therapy following a left rotator cuff repair on 07/10/21. He presented with moderate pain severity and irritability. He exhibited reduced left shoudler PROM due to increased pain. He was educated on the importance of utlizing his sling and limiting his use of his left upper extremity. He would benefit from skilled physical therapy to address his remaining impairments to return to his prior level of function.   ? Personal Factors and Comorbidities Other;Profession   ? Examination-Activity Limitations Reach Overhead;Bathing;Self Feeding;Carry;Sleep;Dressing;Hygiene/Grooming;Lift   ? Examination-Participation Restrictions Occupation;Cleaning;Community Activity;[employer] Work   ? Stability/Clinical Decision Making Stable/Uncomplicated   ? Clinical Decision Making Low   ? Rehab Potential Good   ? PT Frequency 2x / week   ? PT Duration Other (comment)   10 weeks  ? PT Treatment/Interventions ADLs/Self Care Home Management;Cryotherapy;Electrical Stimulation;Moist Heat;Neuromuscular re-education;Therapeutic exercise;Therapeutic activities;Functional mobility training;Patient/family education;Manual techniques;Passive range of motion;Taping;Vasopneumatic Device   ? PT Next Visit Plan PROM, scapular retraction, pendulums, and modalities as needed   ? Consulted and Agree with Plan of Care Patient   ? ?  ?  ? ?  ? ? ?Patient will benefit from skilled therapeutic intervention in order to improve the following deficits and impairments:  Decreased range of motion, Impaired UE functional use, Decreased activity tolerance, Pain, Decreased strength ? ?Visit Diagnosis: ?Acute pain of left shoulder ? ?Stiffness of left shoulder, not elsewhere classified ? ? ? ? ?Problem List ?Patient Active Problem List  ? Diagnosis Date Noted  ? Nontraumatic complete tear of left rotator cuff 05/28/2021  ? Impingement syndrome of left shoulder 05/28/2021  ?  Tendinopathy of left biceps tendon 05/28/2021  ? Arm paresthesia, left 01/02/2021  ? Chronic right shoulder pain 01/02/2021  ? Other fatigue 01/02/2021  ? 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  ? Neuropathic pain 07/18/2

## 2021-07-21 ENCOUNTER — Ambulatory Visit: Payer: 59

## 2021-07-21 DIAGNOSIS — M25612 Stiffness of left shoulder, not elsewhere classified: Secondary | ICD-10-CM

## 2021-07-21 DIAGNOSIS — M25512 Pain in left shoulder: Secondary | ICD-10-CM | POA: Diagnosis not present

## 2021-07-21 NOTE — Therapy (Signed)
[facility] ?[facility] ?[address] ?[place], [place], [address] ?Phone: [phone]   Fax:  [phone] ? ?Physical Therapy Treatment ? ?Patient Details  ?Name: Ryan Rojas ?MRN: [id number] ?Date of Birth: January 08, 1966 ?Referring Provider (PT): Erlinda Hong, MD ? ? ?Encounter Date: 07/21/2021 ? ? PT End of Session - 07/21/21 0820   ? ? Visit Number 2   ? Number of Visits 20   ? Date for PT Re-Evaluation 10/10/21   ? PT Start Time [phone]   ? PT Stop Time 0900   ? PT Time Calculation (min) 44 min   ? Activity Tolerance Patient tolerated treatment well   ? Behavior During Therapy [facility] for tasks assessed/performed   ? ?  ?  ? ?  ? ? ?Past Medical History:  ?Diagnosis Date  ? Hyperlipidemia   ? ? ?History reviewed. No pertinent surgical history. ? ?There were no vitals filed for this visit. ? ? Subjective Assessment - 07/21/21 0819   ? ? Subjective Patient reports that his shoulder feels alright this morning.   ? Pertinent History cannot grip with 4th and 5th fingers of left hand   ? Limitations House hold activities;Lifting   ? Patient Stated Goals use left arm, work on his car, yardwork,   ? Currently in Pain? Yes   ? Pain Score 3    ? Pain Location Shoulder   ? Pain Orientation Left   ? Pain Onset 1 to 4 weeks ago   ? ?  ?  ? ?  ? ? ? ? ? ? ? ? ? ? ? ? ? ? ? ? ? ? ? ? [facility] Adult PT Treatment/Exercise - 07/21/21 0001   ? ?  ? Exercises  ? Exercises Shoulder   ?  ? Shoulder Exercises: Seated  ? Retraction Both;15 reps   ?  ? Shoulder Exercises: ROM/Strengthening  ? Pendulum AP; 15 reps   ?  ? Modalities  ? Modalities Vasopneumatic   ?  ? Vasopneumatic  ? Number Minutes Vasopneumatic  10 minutes   ? Vasopnuematic Location  Shoulder   ? Vasopneumatic Pressure Low   ? Vasopneumatic Temperature  34   ?  ? Manual Therapy  ? Manual Therapy Soft tissue mobilization;Passive ROM   ? Soft tissue mobilization to rotator cuff, deltoid, and upper trapezius   ? Passive ROM all planes to tolerance   ? ?  ?  ? ?   ? ? ? ? ? ? ? ? ? ? ? ? PT Short Term Goals - 07/18/21 0925   ? ?  ? PT SHORT TERM GOAL #1  ? Title Patient will be able to achieve at least 120 degrees of passive left shoulder flexion for improved shoulder moiblity.   ? Time 6   ? Period Weeks   ? Status New   ? Target Date 08/29/21   ?  ? PT SHORT TERM GOAL #2  ? Title Patient will be able to achieve at least 120 degrees of passive left shoulder abduction for improved shoulder moiblity.   ? Time 6   ? Period Weeks   ? Status New   ? Target Date 08/29/21   ? ?  ?  ? ?  ? ? ? ? PT Long Term Goals - 07/18/21 0920   ? ?  ? PT LONG TERM GOAL #1  ? Title Patient will be independent with his HEP.   ? Time 10   ? Period Weeks   ?  Status New   ? Target Date 09/26/21   ?  ? PT LONG TERM GOAL #2  ? Title Patient will be able to demonstrate at least 120 degrees of active left shoulder flexion for improved function with overhead activities.   ? Time 10   ? Period Weeks   ? Status New   ? Target Date 09/26/21   ?  ? PT LONG TERM GOAL #3  ? Title Patient will be able to demonstrate at least 120 degrees of active left shoulder abduction for improved function reaching overhead.   ? Time 10   ? Period Weeks   ? Status New   ? Target Date 09/26/21   ?  ? PT LONG TERM GOAL #4  ? Title Patient will be able to lift at least 10 pounds overhead for improved function with household activities.   ? Time 10   ? Period Weeks   ? Status New   ? Target Date 09/26/01   ? ?  ?  ? ?  ? ? ? ? ? ? ? ? Plan - 07/21/21 0820   ? ? Clinical Impression Statement Treatment focused on manual therapy for reduced shoulder pain and improved mobility. Muscle guarding was his primary limitation with PROM and he required moderate cueing throughout to avoid rotator cuff engagement. His passive shoulder flexion was able to be slightly improved after soft tissue mobilization to the rotator cuff. He was introduced to scapular retraction and pendullums which he was able to safely and properly complete. He reported  that his shoulder felt better upon the conclusion of treatment. He continues to require skilled physical therapy to address his remaining impairments to return to his prior level of function.   ? Personal Factors and Comorbidities Other;Profession   ? Examination-Activity Limitations Reach Overhead;Bathing;Self Feeding;Carry;Sleep;Dressing;Hygiene/Grooming;Lift   ? Examination-Participation Restrictions Occupation;Cleaning;Community Activity;[employer] Work   ? Stability/Clinical Decision Making Stable/Uncomplicated   ? Rehab Potential Good   ? PT Frequency 2x / week   ? PT Duration Other (comment)   10 weeks  ? PT Treatment/Interventions ADLs/Self Care Home Management;Cryotherapy;Electrical Stimulation;Moist Heat;Neuromuscular re-education;Therapeutic exercise;Therapeutic activities;Functional mobility training;Patient/family education;Manual techniques;Passive range of motion;Taping;Vasopneumatic Device   ? PT Next Visit Plan PROM, scapular retraction, pendulums, and modalities as needed   ? Consulted and Agree with Plan of Care Patient   ? ?  ?  ? ?  ? ? ?Patient will benefit from skilled therapeutic intervention in order to improve the following deficits and impairments:  Decreased range of motion, Impaired UE functional use, Decreased activity tolerance, Pain, Decreased strength ? ?Visit Diagnosis: ?Acute pain of left shoulder ? ?Stiffness of left shoulder, not elsewhere classified ? ? ? ? ?Problem List ?Patient Active Problem List  ? Diagnosis Date Noted  ? Nontraumatic complete tear of left rotator cuff 05/28/2021  ? Impingement syndrome of left shoulder 05/28/2021  ? Tendinopathy of left biceps tendon 05/28/2021  ? Arm paresthesia, left 01/02/2021  ? Chronic right shoulder pain 01/02/2021  ? Other fatigue 01/02/2021  ? 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  ? Neuropathic pain 09/30/2020  ? Left arm weakness 09/17/2020  ? Left arm pain 09/17/2020  ? Neck pain on left side 09/17/2020  ? Cubital tunnel syndrome on left  08/16/2020  ? Intractable neuropathic pain of lumbosacral origin 05/20/2020  ? Combined arterial insufficiency and corporo-venous occlusive erectile dysfunction 09/27/2019  ? 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  ? Impotence 01/18/2018  ? Hyperlipidemia 07/16/2017  ? ? ?Darlin Coco, PT ?07/21/2021, 12:50 PM ? ?  [place] ?[facility] ?[address] ?[place], [place], [address] ?Phone: [phone]   Fax:  [phone] ? ?Name: Ryan Rojas ?MRN: [id number] ?Date of Birth: 10/08/1965 ? ? ? ?

## 2021-07-23 ENCOUNTER — Ambulatory Visit: Payer: 59

## 2021-07-23 DIAGNOSIS — M25512 Pain in left shoulder: Secondary | ICD-10-CM | POA: Diagnosis not present

## 2021-07-23 DIAGNOSIS — M25612 Stiffness of left shoulder, not elsewhere classified: Secondary | ICD-10-CM

## 2021-07-23 NOTE — Therapy (Signed)
[place] ?[facility] ?[address] ?[place], [place], [address] ?Phone: [phone]   Fax:  [phone] ? ?Physical Therapy Treatment ? ?Patient Details  ?Name: Ryan Rojas ?MRN: [id number] ?Date of Birth: [birth year] ?Referring Provider (PT): Roda Shutters, MD ? ? ?Encounter Date: 07/23/2021 ? ? PT End of Session - 07/23/21 1601   ? ? Visit Number 3   ? Number of Visits 20   ? Date for PT Re-Evaluation 10/10/21   ? PT Start Time 0940   ? PT Stop Time 1024   ? PT Time Calculation (min) 44 min   ? Activity Tolerance Patient tolerated treatment well   ? Behavior During Therapy [facility] for tasks assessed/performed   ? ?  ?  ? ?  ? ? ?Past Medical History:  ?Diagnosis Date  ? Hyperlipidemia   ? ? ?History reviewed. No pertinent surgical history. ? ?There were no vitals filed for this visit. ? ? Subjective Assessment - 07/23/21 0941   ? ? Subjective Patient reports that his shoulder feels alright today.   ? Pertinent History cannot grip with 4th and 5th fingers of left hand   ? Limitations House hold activities;Lifting   ? Patient Stated Goals use left arm, work on his car, yardwork,   ? Currently in Pain? Yes   ? Pain Score 3    ? Pain Location Shoulder   ? Pain Orientation Left   ? Pain Onset 1 to 4 weeks ago   ? ?  ?  ? ?  ? ? ? ? ? ? ? ? ? ? ? ? ? ? ? ? ? ? ? ? [facility] Adult PT Treatment/Exercise - 07/23/21 0001   ? ?  ? Modalities  ? Modalities Vasopneumatic   ?  ? Vasopneumatic  ? Number Minutes Vasopneumatic  15 minutes   ? Vasopnuematic Location  Shoulder   ? Vasopneumatic Pressure Low   ? Vasopneumatic Temperature  34   ?  ? Manual Therapy  ? Manual Therapy Soft tissue mobilization;Passive ROM   ? Soft tissue mobilization to rotator cuff, deltoid, and upper trapezius   ? Passive ROM all planes to tolerance   ? ?  ?  ? ?  ? ? ? ? ? ? ? ? ? ? ? ? PT Short Term Goals - 07/18/21 0925   ? ?  ? PT SHORT TERM GOAL #1  ? Title Patient will be able to achieve at least 120 degrees of passive left shoulder  flexion for improved shoulder moiblity.   ? Time 6   ? Period Weeks   ? Status New   ? Target Date 08/29/21   ?  ? PT SHORT TERM GOAL #2  ? Title Patient will be able to achieve at least 120 degrees of passive left shoulder abduction for improved shoulder moiblity.   ? Time 6   ? Period Weeks   ? Status New   ? Target Date 08/29/21   ? ?  ?  ? ?  ? ? ? ? PT Long Term Goals - 07/18/21 0920   ? ?  ? PT LONG TERM GOAL #1  ? Title Patient will be independent with his HEP.   ? Time 10   ? Period Weeks   ? Status New   ? Target Date 09/26/21   ?  ? PT LONG TERM GOAL #2  ? Title Patient will be able to demonstrate at least 120 degrees of active left shoulder flexion for improved  function with overhead activities.   ? Time 10   ? Period Weeks   ? Status New   ? Target Date 09/26/21   ?  ? PT LONG TERM GOAL #3  ? Title Patient will be able to demonstrate at least 120 degrees of active left shoulder abduction for improved function reaching overhead.   ? Time 10   ? Period Weeks   ? Status New   ? Target Date 09/26/21   ?  ? PT LONG TERM GOAL #4  ? Title Patient will be able to lift at least 10 pounds overhead for improved function with household activities.   ? Time 10   ? Period Weeks   ? Status New   ? Target Date 09/26/01   ? ?  ?  ? ?  ? ? ? ? ? ? ? ? Plan - 07/23/21 0944   ? ? Clinical Impression Statement Treatment focused on manual therapy which included PROM and soft tissue mobilization to the rotator cuff. He was able to demonstrate improved passive left shoulder flexion. However, he continues to experience a significant increase in anterior left shoulder pain when returning to neutral, but this quickly resolved when he reached neutral. He reported that his shoulder felt a little sore, but better upon the conclusion of treatment. He continues to require skilled physical therapy to address his remaining impairments to return to his prior level of function.   ? Personal Factors and Comorbidities Other;Profession   ?  Examination-Activity Limitations Reach Overhead;Bathing;Self Feeding;Carry;Sleep;Dressing;Hygiene/Grooming;Lift   ? Examination-Participation Restrictions Occupation;Cleaning;Community Activity;Pincus Badder Work   ? Stability/Clinical Decision Making Stable/Uncomplicated   ? Rehab Potential Good   ? PT Frequency 2x / week   ? PT Duration Other (comment)   10 weeks  ? PT Treatment/Interventions ADLs/Self Care Home Management;Cryotherapy;Electrical Stimulation;Moist Heat;Neuromuscular re-education;Therapeutic exercise;Therapeutic activities;Functional mobility training;Patient/family education;Manual techniques;Passive range of motion;Taping;Vasopneumatic Device   ? PT Next Visit Plan PROM, scapular retraction, pendulums, and modalities as needed   ? Consulted and Agree with Plan of Care Patient   ? ?  ?  ? ?  ? ? ?Patient will benefit from skilled therapeutic intervention in order to improve the following deficits and impairments:  Decreased range of motion, Impaired UE functional use, Decreased activity tolerance, Pain, Decreased strength ? ?Visit Diagnosis: ?Acute pain of left shoulder ? ?Stiffness of left shoulder, not elsewhere classified ? ? ? ? ?Problem List ?Patient Active Problem List  ? Diagnosis Date Noted  ? Nontraumatic complete tear of left rotator cuff 05/28/2021  ? Impingement syndrome of left shoulder 05/28/2021  ? Tendinopathy of left biceps tendon 05/28/2021  ? Arm paresthesia, left 01/02/2021  ? Chronic right shoulder pain 01/02/2021  ? Other fatigue 01/02/2021  ? 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  ? Neuropathic pain 09/30/2020  ? Left arm weakness 09/17/2020  ? Left arm pain 09/17/2020  ? Neck pain on left side 09/17/2020  ? Cubital tunnel syndrome on left 08/16/2020  ? Intractable neuropathic pain of lumbosacral origin 05/20/2020  ? Combined arterial insufficiency and corporo-venous occlusive erectile dysfunction 09/27/2019  ? 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  ? Impotence 01/18/2018  ? Hyperlipidemia 07/16/2017   ? ? ?Granville Lewis, PT ?07/23/2021, 10:26 AM ? ?[place] ?[facility] ?[address] ?[place], [place], [address] ?Phone: [phone]   Fax:  [phone] ? ?Name: Ryan Rojas ?MRN: [id number] ?Date of Birth: 07-07-1965 ? ? ? ?

## 2021-07-30 ENCOUNTER — Ambulatory Visit: Payer: 59

## 2021-07-30 DIAGNOSIS — M25612 Stiffness of left shoulder, not elsewhere classified: Secondary | ICD-10-CM

## 2021-07-30 DIAGNOSIS — M25512 Pain in left shoulder: Secondary | ICD-10-CM | POA: Diagnosis not present

## 2021-07-30 NOTE — Therapy (Signed)
[place] ?[facility] ?[address] ?[place], [place], [address] ?Phone: [phone]   Fax:  [phone] ? ?Physical Therapy Treatment ? ?Patient Details  ?Name: Ryan Rojas ?MRN: [id number] ?Date of Birth: 27-Sep-1965 ?Referring Provider (PT): Roda Shutters, MD ? ? ?Encounter Date: 07/30/2021 ? ? PT End of Session - 07/30/21 0818   ? ? Visit Number 4   ? Number of Visits 20   ? Date for PT Re-Evaluation 10/10/21   ? PT Start Time 0815   ? PT Stop Time 0850   ? PT Time Calculation (min) 35 min   ? Activity Tolerance Patient tolerated treatment well   ? Behavior During Therapy [facility] for tasks assessed/performed   ? ?  ?  ? ?  ? ? ?Past Medical History:  ?Diagnosis Date  ? Hyperlipidemia   ? ? ?History reviewed. No pertinent surgical history. ? ?There were no vitals filed for this visit. ? ? Subjective Assessment - 07/30/21 0817   ? ? Subjective Patient reports that his shoulder is hurting a little bit this morning, but not as bad as it was Monday.   ? Pertinent History cannot grip with 4th and 5th fingers of left hand   ? Limitations House hold activities;Lifting   ? Patient Stated Goals use left arm, work on his car, yardwork,   ? Currently in Pain? Yes   ? Pain Score 4    ? Pain Location Shoulder   ? Pain Orientation Left;Lateral   ? Pain Onset 1 to 4 weeks ago   ? ?  ?  ? ?  ? ? ? ? ? ? ? ? ? ? ? ? ? ? ? ? ? ? ? ? [facility] Adult PT Treatment/Exercise - 07/30/21 0001   ? ?  ? Manual Therapy  ? Manual Therapy Soft tissue mobilization;Passive ROM   ? Soft tissue mobilization to rotator cuff, deltoid, and biceps   ? Passive ROM all planes to tolerance   ? ?  ?  ? ?  ? ? ? ? ? ? ? ? ? ? ? ? PT Short Term Goals - 07/18/21 0925   ? ?  ? PT SHORT TERM GOAL #1  ? Title Patient will be able to achieve at least 120 degrees of passive left shoulder flexion for improved shoulder moiblity.   ? Time 6   ? Period Weeks   ? Status New   ? Target Date 08/29/21   ?  ? PT SHORT TERM GOAL #2  ? Title Patient will be  able to achieve at least 120 degrees of passive left shoulder abduction for improved shoulder moiblity.   ? Time 6   ? Period Weeks   ? Status New   ? Target Date 08/29/21   ? ?  ?  ? ?  ? ? ? ? PT Long Term Goals - 07/18/21 0920   ? ?  ? PT LONG TERM GOAL #1  ? Title Patient will be independent with his HEP.   ? Time 10   ? Period Weeks   ? Status New   ? Target Date 09/26/21   ?  ? PT LONG TERM GOAL #2  ? Title Patient will be able to demonstrate at least 120 degrees of active left shoulder flexion for improved function with overhead activities.   ? Time 10   ? Period Weeks   ? Status New   ? Target Date 09/26/21   ?  ? PT LONG TERM  GOAL #3  ? Title Patient will be able to demonstrate at least 120 degrees of active left shoulder abduction for improved function reaching overhead.   ? Time 10   ? Period Weeks   ? Status New   ? Target Date 09/26/21   ?  ? PT LONG TERM GOAL #4  ? Title Patient will be able to lift at least 10 pounds overhead for improved function with household activities.   ? Time 10   ? Period Weeks   ? Status New   ? Target Date 09/26/01   ? ?  ?  ? ?  ? ? ? ? ? ? ? ? Plan - 07/30/21 0852   ? ? Clinical Impression Statement Treatment focused on manual therapy for reduced shoulder pain and improved mobility. He experienced a moderate increase in left shoulder pain and discomfort with returning to neutral due to muscle guarding. Soft tissue mobilization to the proximal biceps was the most effective at reducing this pain and guarding. He reported that his shoulder felt good and was not hurting upon the conclusion of treatment. He continues to require skilled physical therapy to address his remaining impairments to return to his prior level of function.   ? Personal Factors and Comorbidities Other;Profession   ? Examination-Activity Limitations Reach Overhead;Bathing;Self Feeding;Carry;Sleep;Dressing;Hygiene/Grooming;Lift   ? Examination-Participation Restrictions Occupation;Cleaning;Community  Activity;Pincus Badder Work   ? Stability/Clinical Decision Making Stable/Uncomplicated   ? Rehab Potential Good   ? PT Frequency 2x / week   ? PT Duration Other (comment)   10 weeks  ? PT Treatment/Interventions ADLs/Self Care Home Management;Cryotherapy;Electrical Stimulation;Moist Heat;Neuromuscular re-education;Therapeutic exercise;Therapeutic activities;Functional mobility training;Patient/family education;Manual techniques;Passive range of motion;Taping;Vasopneumatic Device   ? PT Next Visit Plan PROM, scapular retraction, pendulums, and modalities as needed   ? Consulted and Agree with Plan of Care Patient   ? ?  ?  ? ?  ? ? ?Patient will benefit from skilled therapeutic intervention in order to improve the following deficits and impairments:  Decreased range of motion, Impaired UE functional use, Decreased activity tolerance, Pain, Decreased strength ? ?Visit Diagnosis: ?Acute pain of left shoulder ? ?Stiffness of left shoulder, not elsewhere classified ? ? ? ? ?Problem List ?Patient Active Problem List  ? Diagnosis Date Noted  ? Nontraumatic complete tear of left rotator cuff 05/28/2021  ? Impingement syndrome of left shoulder 05/28/2021  ? Tendinopathy of left biceps tendon 05/28/2021  ? Arm paresthesia, left 01/02/2021  ? Chronic right shoulder pain 01/02/2021  ? Other fatigue 01/02/2021  ? 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  ? Neuropathic pain 09/30/2020  ? Left arm weakness 09/17/2020  ? Left arm pain 09/17/2020  ? Neck pain on left side 09/17/2020  ? Cubital tunnel syndrome on left 08/16/2020  ? Intractable neuropathic pain of lumbosacral origin 05/20/2020  ? Combined arterial insufficiency and corporo-venous occlusive erectile dysfunction 09/27/2019  ? 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  ? Impotence 01/18/2018  ? Hyperlipidemia 07/16/2017  ? ? ?Granville Lewis, PT ?07/30/2021, 2:24 PM ? ?[place] ?[facility] ?[address] ?[place], [place], [address] ?Phone: [phone]   Fax:   [phone] ? ?Name: [name] ?MRN: [id number] ?Date of Birth: 1965/08/04 ? ? ? ?

## 2021-08-01 ENCOUNTER — Ambulatory Visit: Payer: 59

## 2021-08-01 DIAGNOSIS — M25612 Stiffness of left shoulder, not elsewhere classified: Secondary | ICD-10-CM

## 2021-08-01 DIAGNOSIS — M25512 Pain in left shoulder: Secondary | ICD-10-CM

## 2021-08-01 NOTE — Therapy (Signed)
[facility] [place], [place], [address] Phone: [phone]   Fax:  [phone]  Physical Therapy Treatment  Patient Details  Name: Ryan Rojas MRN: [id number] Date of Birth: [birth year] Referring Provider (PT): Erlinda Hong, MD   Encounter Date: 08/01/2021   PT End of Session - 08/01/21 0923     Visit Number 5    Number of Visits 20    Date for PT Re-Evaluation 10/10/21    PT Start Time 0815    PT Stop Time 0900    PT Time Calculation (min) 45 min    Activity Tolerance Patient tolerated treatment well    Behavior During Therapy [facility] for tasks assessed/performed             Past Medical History:  Diagnosis Date   Hyperlipidemia     History reviewed. No pertinent surgical history.  There were no vitals filed for this visit.   Subjective Assessment - 08/01/21 0819     Subjective Patient reports that his shoulder feels alright today. However, his left wrist is bothering him more today than anything.    Pertinent History cannot grip with 4th and 5th fingers of left hand    Limitations House hold activities;Lifting    Patient Stated Goals use left arm, work on his car, yardwork,    Currently in Pain? Yes    Pain Score 3     Pain Location Shoulder    Pain Orientation Left    Pain Onset 1 to 4 weeks ago                               [facility] Adult PT Treatment/Exercise - 08/01/21 0001       Shoulder Exercises: Pulleys   Flexion 5 minutes;Other (comment)   left shoulder PROM     Shoulder Exercises: ROM/Strengthening   Pendulum AP; 3 minutes      Manual Therapy   Manual Therapy Joint mobilization;Soft tissue mobilization;Passive ROM    Joint Mobilization Grade I-II inferior and posterior glenohumeral    Soft tissue mobilization to rotator cuff, deltoid, and biceps    Passive ROM all planes to tolerance                       PT Short Term Goals - 07/18/21 0925       PT SHORT TERM  GOAL #1   Title Patient will be able to achieve at least 120 degrees of passive left shoulder flexion for improved shoulder moiblity.    Time 6    Period Weeks    Status New    Target Date 08/29/21      PT SHORT TERM GOAL #2   Title Patient will be able to achieve at least 120 degrees of passive left shoulder abduction for improved shoulder moiblity.    Time 6    Period Weeks    Status New    Target Date 08/29/21               PT Long Term Goals - 07/18/21 0920       PT LONG TERM GOAL #1   Title Patient will be independent with his HEP.    Time 10    Period Weeks    Status New    Target Date 09/26/21      PT LONG TERM GOAL #2   Title Patient will be able to demonstrate at  least 120 degrees of active left shoulder flexion for improved function with overhead activities.    Time 10    Period Weeks    Status New    Target Date 09/26/21      PT LONG TERM GOAL #3   Title Patient will be able to demonstrate at least 120 degrees of active left shoulder abduction for improved function reaching overhead.    Time 10    Period Weeks    Status New    Target Date 09/26/21      PT LONG TERM GOAL #4   Title Patient will be able to lift at least 10 pounds overhead for improved function with household activities.    Time 10    Period Weeks    Status New    Target Date 09/26/01                   Plan - 08/01/21 0924     Clinical Impression Statement Patient was introduced to passive shoulder flexion with the pulleys. He experienced increased left shoulder tightness initially, but this steadily improved as he continued with this intervention. This was followed by manual therapy for improved shoulder mobility. He was able to achieve approximately 90 degrees of shoulder flexion prior to discomfort initially. This was able to be improved to over 100 degrees after soft tissue mobilization to the left shoulder and light glenohumeral joint mobilizations. He reported that his  shoulder felt good upon the conclusion of treatment. He continues to require skilled physical therapy to address his remaining impairments to return to his prior level of function.    Personal Factors and Comorbidities Other;Profession    Examination-Activity Limitations Reach Overhead;Bathing;Self Feeding;Carry;Sleep;Dressing;Hygiene/Grooming;Lift    Examination-Participation Restrictions Occupation;Cleaning;Community Activity;Yard Work    Stability/Clinical Decision Making Stable/Uncomplicated    Rehab Potential Good    PT Frequency 2x / week    PT Duration Other (comment)   10 weeks   PT Treatment/Interventions ADLs/Self Care Home Management;Cryotherapy;Electrical Stimulation;Moist Heat;Neuromuscular re-education;Therapeutic exercise;Therapeutic activities;Functional mobility training;Patient/family education;Manual techniques;Passive range of motion;Taping;Vasopneumatic Device    PT Next Visit Plan PROM, scapular retraction, pendulums, and modalities as needed    Consulted and Agree with Plan of Care Patient             Patient will benefit from skilled therapeutic intervention in order to improve the following deficits and impairments:  Decreased range of motion, Impaired UE functional use, Decreased activity tolerance, Pain, Decreased strength  Visit Diagnosis: Acute pain of left shoulder  Stiffness of left shoulder, not elsewhere classified     Problem List Patient Active Problem List   Diagnosis Date Noted   Nontraumatic complete tear of left rotator cuff 05/28/2021   Impingement syndrome of left shoulder 05/28/2021   Tendinopathy of left biceps tendon 05/28/2021   Arm paresthesia, left 01/02/2021   Chronic right shoulder pain 01/02/2021   Other fatigue 01/02/2021   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   Neuropathic pain 09/30/2020   Left arm weakness 09/17/2020   Left arm pain 09/17/2020   Neck pain on left side 09/17/2020   Cubital tunnel syndrome on left 08/16/2020    Intractable neuropathic pain of lumbosacral origin 05/20/2020   Combined arterial insufficiency and corporo-venous occlusive erectile dysfunction 09/27/2019   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   Impotence 01/18/2018   Hyperlipidemia 07/16/2017    Darlin Coco, PT 08/01/2021, 9:32 AM  [facility] [address] [place], [place], [address] Phone: [phone]   Fax:  [phone]  Name: Ryan Rojas MRN: [id number] Date of Birth: [birth year]

## 2021-08-06 ENCOUNTER — Ambulatory Visit: Payer: 59

## 2021-08-08 ENCOUNTER — Ambulatory Visit: Payer: 59

## 2021-08-08 DIAGNOSIS — M25512 Pain in left shoulder: Secondary | ICD-10-CM

## 2021-08-08 DIAGNOSIS — M25612 Stiffness of left shoulder, not elsewhere classified: Secondary | ICD-10-CM

## 2021-08-08 NOTE — Therapy (Signed)
[facility] [address] [place], [place], [address] Phone: [phone]   Fax:  [phone]  Physical Therapy Treatment  Patient Details  Name: Ryan Rojas MRN: [id number] Date of Birth: [birth year] Referring Provider (PT): Roda ShuttersXu, MD   Encounter Date: 08/08/2021   PT End of Session - 08/08/21 [id number]     Visit Number 6    Number of Visits 20    Date for PT Re-Evaluation 10/10/21    PT Start Time 0819    PT Stop Time 0859    PT Time Calculation (min) 40 min    Activity Tolerance Patient tolerated treatment well    Behavior During Therapy [facility] for tasks assessed/performed             Past Medical History:  Diagnosis Date   Hyperlipidemia     History reviewed. No pertinent surgical history.  There were no vitals filed for this visit.   Subjective Assessment - 08/08/21 0821     Subjective Patient reports that his shoulder feels alright right now. However, it will still hurt some.    Pertinent History cannot grip with 4th and 5th fingers of left hand    Limitations House hold activities;Lifting    Patient Stated Goals use left arm, work on his car, yardwork,    Currently in Pain? No/denies    Pain Onset 1 to 4 weeks ago                               [facility]/Exercise - 08/08/21 0001       Shoulder Exercises: Seated   External Rotation PROM;Left   with cane; 2 minutes     Shoulder Exercises: Pulleys   Flexion 5 minutes;Other (comment)   left shoulder PROM     Shoulder Exercises: Isometric Strengthening   Extension Other (comment)   2 minutes; 5 second hold; 25% max effort   ABduction Other (comment)   2 minutes; 5 second hold; 25% max effort   ADduction Other (comment)   2 minutes; 5 second hold; 25% max effort     Manual Therapy   Manual Therapy Joint mobilization;Soft tissue mobilization;Passive ROM    Joint Mobilization Grade I-III inferior and posterior glenohumeral    Soft  tissue mobilization to rotator cuff, deltoid, and biceps    Passive ROM all planes to tolerance                       PT Short Term Goals - 07/18/21 0925       PT SHORT TERM GOAL #1   Title Patient will be able to achieve at least 120 degrees of passive left shoulder flexion for improved shoulder moiblity.    Time 6    Period Weeks    Status New    Target Date 08/29/21      PT SHORT TERM GOAL #2   Title Patient will be able to achieve at least 120 degrees of passive left shoulder abduction for improved shoulder moiblity.    Time 6    Period Weeks    Status New    Target Date 08/29/21               PT Long Term Goals - 07/18/21 0920       PT LONG TERM GOAL #1   Title Patient will be independent with his HEP.    Time 10  Period Weeks    Status New    Target Date 09/26/21      PT LONG TERM GOAL #2   Title Patient will be able to demonstrate at least 120 degrees of active left shoulder flexion for improved function with overhead activities.    Time 10    Period Weeks    Status New    Target Date 09/26/21      PT LONG TERM GOAL #3   Title Patient will be able to demonstrate at least 120 degrees of active left shoulder abduction for improved function reaching overhead.    Time 10    Period Weeks    Status New    Target Date 09/26/21      PT LONG TERM GOAL #4   Title Patient will be able to lift at least 10 pounds overhead for improved function with household activities.    Time 10    Period Weeks    Status New    Target Date 09/26/01                   Plan - 08/08/21 [id number]     Clinical Impression Statement Patient was introduced to light isometric interventions for rotator cuff engagement. He required minimal cueing with these interventions to limit his force production to isolate appropriate muscular engagement. He reported no pain or discomfort with any of these interventions. Manual therapy focused on improved PROM through the use of  glenohumeral joint mobilizations and soft tissue mobilization to the rotator cuff. He reported that his shoulder felt good upon the conclusion of treatment. He continues to require skilled physical therapy to address his remaining impairments to return to his prior level of function.    Personal Factors and Comorbidities Other;Profession    Examination-Activity Limitations Reach Overhead;Bathing;Self Feeding;Carry;Sleep;Dressing;Hygiene/Grooming;Lift    Examination-Participation Restrictions Occupation;Cleaning;Community Activity;Yard Work    Stability/Clinical Decision Making Stable/Uncomplicated    Rehab Potential Good    PT Frequency 2x / week    PT Duration Other (comment)   10 weeks   PT Treatment/Interventions ADLs/Self Care Home Management;Cryotherapy;Electrical Stimulation;Moist Heat;Neuromuscular re-education;Therapeutic exercise;Therapeutic activities;Functional mobility training;Patient/family education;Manual techniques;Passive range of motion;Taping;Vasopneumatic Device    PT Next Visit Plan PROM, scapular retraction, pendulums, and modalities as needed    Consulted and Agree with Plan of Care Patient             Patient will benefit from skilled therapeutic intervention in order to improve the following deficits and impairments:  Decreased range of motion, Impaired UE functional use, Decreased activity tolerance, Pain, Decreased strength  Visit Diagnosis: Acute pain of left shoulder  Stiffness of left shoulder, not elsewhere classified     Problem List Patient Active Problem List   Diagnosis Date Noted   Nontraumatic complete tear of left rotator cuff 05/28/2021   Impingement syndrome of left shoulder 05/28/2021   Tendinopathy of left biceps tendon 05/28/2021   Arm paresthesia, left 01/02/2021   Chronic right shoulder pain 01/02/2021   Other fatigue 01/02/2021   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   Neuropathic pain 09/30/2020   Left arm weakness 09/17/2020   Left arm  pain 09/17/2020   Neck pain on left side 09/17/2020   Cubital tunnel syndrome on left 08/16/2020   Intractable neuropathic pain of lumbosacral origin 05/20/2020   Combined arterial insufficiency and corporo-venous occlusive erectile dysfunction 09/27/2019   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   Impotence 01/18/2018   Hyperlipidemia 07/16/2017    Darlin Coco, PT 08/08/2021, 10:16 AM  [facility] [place], [place], [address] Phone: [phone]   Fax:  [phone]  Name: Ryan Rojas MRN: [id number] Date of Birth: [birth year]

## 2021-08-14 ENCOUNTER — Encounter: Payer: Self-pay | Admitting: Physical Therapy

## 2021-08-14 ENCOUNTER — Ambulatory Visit: Payer: 59 | Attending: Orthopaedic Surgery | Admitting: Physical Therapy

## 2021-08-14 DIAGNOSIS — M25612 Stiffness of left shoulder, not elsewhere classified: Secondary | ICD-10-CM | POA: Insufficient documentation

## 2021-08-14 DIAGNOSIS — M25512 Pain in left shoulder: Secondary | ICD-10-CM | POA: Insufficient documentation

## 2021-08-14 NOTE — Therapy (Addendum)
[facility] [address] [place], [place], [address] Phone: [phone]   Fax:  [phone]  Physical Therapy Treatment  Patient Details  Name: Chanze Teagle MRN: [id number] Date of Birth: April 07, 1965 Referring Provider (PT): Roda Shutters, MD   Encounter Date: 08/14/2021   PT End of Session - 08/14/21 0819     Visit Number 7    Number of Visits 20    Date for PT Re-Evaluation 10/10/21    PT Start Time 0817    PT Stop Time 0859    PT Time Calculation (min) 42 min    Equipment Utilized During Treatment Other (comment)   shoulder sling with adductor pillow   Activity Tolerance Patient tolerated treatment well    Behavior During Therapy [facility] for tasks assessed/performed             Past Medical History:  Diagnosis Date   Hyperlipidemia     History reviewed. No pertinent surgical history.  There were no vitals filed for this visit.   Subjective Assessment - 08/14/21 0818     Subjective Reports that pain is intermittant but is worse at night.    Pertinent History cannot grip with 4th and 5th fingers of left hand    Limitations House hold activities;Lifting    Patient Stated Goals use left arm, work on his car, yardwork,    Currently in Pain? Yes    Pain Score 3     Pain Location Shoulder    Pain Orientation Left    Pain Descriptors / Indicators Discomfort    Pain Type Surgical pain    Pain Onset 1 to 4 weeks ago    Pain Frequency Intermittent                [facility] PT Assessment - 08/14/21 0001       Assessment   Medical Diagnosis Nontraumatic complete tear of left rotator cuff    Referring Provider (PT) Roda Shutters, MD    Onset Date/Surgical Date 07/10/21    Hand Dominance Left    Next MD Visit 08/15/21    Prior Therapy No      Precautions   Precautions Shoulder    Type of Shoulder Precautions Left RTC repair    Shoulder Interventions Shoulder sling/immobilizer;Shoulder abduction pillow;At all times      ROM / Strength   AROM  / PROM / Strength PROM      PROM   Overall PROM  Deficits    Overall PROM Comments Increased pain returning to neutral    PROM Assessment Site Shoulder    Right/Left Shoulder Left    Left Shoulder Flexion 113 Degrees    Left Shoulder External Rotation 50 Degrees                           [facility] Adult PT Treatment/Exercise - 08/14/21 0001       Shoulder Exercises: Seated   External Rotation AAROM;Both;20 reps      Shoulder Exercises: Pulleys   Flexion 5 minutes      Shoulder Exercises: ROM/Strengthening   Ranger seated; flex/ext x2 min, circles x2 min      Shoulder Exercises: Isometric Strengthening   Flexion 5X5"    External Rotation 5X5"    Internal Rotation 5X5"      Manual Therapy   Manual Therapy Passive ROM;Soft tissue mobilization    Manual therapy comments oscillations provided throughout PROM session    Soft tissue mobilization STW  to L deltoids, bicep    Passive ROM PROM of L shoulder into flexion, ER/IR with gentle holds                       PT Short Term Goals - 07/18/21 0925       PT SHORT TERM GOAL #1   Title Patient will be able to achieve at least 120 degrees of passive left shoulder flexion for improved shoulder moiblity.    Time 6    Period Weeks    Status New    Target Date 08/29/21      PT SHORT TERM GOAL #2   Title Patient will be able to achieve at least 120 degrees of passive left shoulder abduction for improved shoulder moiblity.    Time 6    Period Weeks    Status New    Target Date 08/29/21               PT Long Term Goals - 07/18/21 0920       PT LONG TERM GOAL #1   Title Patient will be independent with his HEP.    Time 10    Period Weeks    Status New    Target Date 09/26/21      PT LONG TERM GOAL #2   Title Patient will be able to demonstrate at least 120 degrees of active left shoulder flexion for improved function with overhead activities.    Time 10    Period Weeks    Status New     Target Date 09/26/21      PT LONG TERM GOAL #3   Title Patient will be able to demonstrate at least 120 degrees of active left shoulder abduction for improved function reaching overhead.    Time 10    Period Weeks    Status New    Target Date 09/26/21      PT LONG TERM GOAL #4   Title Patient will be able to lift at least 10 pounds overhead for improved function with household activities.    Time 10    Period Weeks    Status New    Target Date 09/26/01                   Plan - 08/14/21 0900     Clinical Impression Statement Patient presented in clinic with reports of mild L shoulder pain which is worse at night. Patient able to tolerate all light ROM and isometric training without complaint of pain. Patient indicates that he is still icing regularly at home and does not use his sling as long as he is within his home. Frequent oscillations provided during PROM of L shoulder with tight, firm end feels especially with flexion. Discomfort reported at end range as well.    Personal Factors and Comorbidities Other;Profession    Examination-Activity Limitations Reach Overhead;Bathing;Self Feeding;Carry;Sleep;Dressing;Hygiene/Grooming;Lift    Examination-Participation Restrictions Occupation;Cleaning;Community Activity;Yard Work    Stability/Clinical Decision Making Stable/Uncomplicated    Rehab Potential Good    PT Frequency 2x / week    PT Duration Other (comment)    PT Treatment/Interventions ADLs/Self Care Home Management;Cryotherapy;Electrical Stimulation;Moist Heat;Neuromuscular re-education;Therapeutic exercise;Therapeutic activities;Functional mobility training;Patient/family education;Manual techniques;Passive range of motion;Taping;Vasopneumatic Device    PT Next Visit Plan PROM, scapular retraction, pendulums, and modalities as needed    Consulted and Agree with Plan of Care Patient             Patient will benefit  from skilled therapeutic intervention in order to  improve the following deficits and impairments:  Decreased range of motion, Impaired UE functional use, Decreased activity tolerance, Pain, Decreased strength  Visit Diagnosis: Acute pain of left shoulder  Stiffness of left shoulder, not elsewhere classified     Problem List Patient Active Problem List   Diagnosis Date Noted   Nontraumatic complete tear of left rotator cuff 05/28/2021   Impingement syndrome of left shoulder 05/28/2021   Tendinopathy of left biceps tendon 05/28/2021   Arm paresthesia, left 01/02/2021   Chronic right shoulder pain 01/02/2021   Other fatigue 01/02/2021   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   Neuropathic pain 09/30/2020   Left arm weakness 09/17/2020   Left arm pain 09/17/2020   Neck pain on left side 09/17/2020   Cubital tunnel syndrome on left 08/16/2020   Intractable neuropathic pain of lumbosacral origin 05/20/2020   Combined arterial insufficiency and corporo-venous occlusive erectile dysfunction 09/27/2019   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   Impotence 01/18/2018   Hyperlipidemia 07/16/2017   Rationale for Evaluation and Treatment Rehabilitation   Marvell Fuller, PTA 08/14/2021, 9:12 AM  [facility] [address] [place], [place], [address] Phone: [phone]   Fax:  [phone]  Name: Traxton Kolenda MRN: [id number] Date of Birth: [birth year]  Progress Note Reporting Period 07/18/21 to 08/14/21  See note below for Objective Data and Assessment of Progress/Goals.   Patient is progressing as expected following a rotator cuff repair. His PROM remains limited due to an increase in pain and discomfort at end range. Recommend that he continue with his plan of care to address his remaining impairments to return to his prior level of function.   Candi Leash, PT, DPT

## 2021-08-15 ENCOUNTER — Ambulatory Visit (INDEPENDENT_AMBULATORY_CARE_PROVIDER_SITE_OTHER): Payer: 59 | Admitting: Orthopaedic Surgery

## 2021-08-15 ENCOUNTER — Encounter: Payer: Self-pay | Admitting: Orthopaedic Surgery

## 2021-08-15 DIAGNOSIS — M75122 Complete rotator cuff tear or rupture of left shoulder, not specified as traumatic: Secondary | ICD-10-CM

## 2021-08-15 NOTE — Progress Notes (Signed)
   Post-Op Visit Note   Patient: Ryan Rojas           Date of Birth: 07-12-1965           MRN: [id number] Visit Date: 08/15/2021 PCP: Claretta Fraise, MD   Assessment & Plan:  Visit Diagnoses:  1. Nontraumatic complete tear of left rotator cuff     Plan: Arsene is 6 weeks status post left rotator cuff repair.  He is progressing in physical therapy.  Overall feels improvement.  Managing the pain with ibuprofen.  Mainly the pain is the worst at night.  No real complaints otherwise.  Examination left shoulder shows fully healed surgical scars.  There is no Popeye deformity.  There is some mild atrophy of the biceps muscle belly.  Range of motion is appropriate for 6 weeks.  At this point he can begin to wean the sling.  He will continue to do outpatient PT and his home exercises.  He is now medically ready to return back to work yet.  Out of work for another 6 weeks at least until follow-up  Follow-Up Instructions: Return in about 6 weeks (around 09/26/2021).   Orders:  No orders of the defined types were placed in this encounter.  No orders of the defined types were placed in this encounter.   Imaging: No results found.  PMFS History: Patient Active Problem List   Diagnosis Date Noted   Nontraumatic complete tear of left rotator cuff 05/28/2021   Impingement syndrome of left shoulder 05/28/2021   Tendinopathy of left biceps tendon 05/28/2021   Arm paresthesia, left 01/02/2021   Chronic right shoulder pain 01/02/2021   Other fatigue 01/02/2021   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   Neuropathic pain 09/30/2020   Left arm weakness 09/17/2020   Left arm pain 09/17/2020   Neck pain on left side 09/17/2020   Cubital tunnel syndrome on left 08/16/2020   Intractable neuropathic pain of lumbosacral origin 05/20/2020   Combined arterial insufficiency and corporo-venous occlusive erectile dysfunction 09/27/2019   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   Impotence 01/18/2018   Hyperlipidemia  07/16/2017   Past Medical History:  Diagnosis Date   Hyperlipidemia     Family History  Problem Relation Age of Onset   Asthma Mother    Diabetes Sister    Asthma Sister     History reviewed. No pertinent surgical history. Social History   Occupational History   Not on file  Tobacco Use   Smoking status: Never   Smokeless tobacco: Never  Vaping Use   Vaping Use: Never used  Substance and Sexual Activity   Alcohol use: Yes    Alcohol/week: 1.0 standard drink    Types: 1 Cans of beer per week   Drug use: No   Sexual activity: Yes    Birth control/protection: None

## 2021-08-18 ENCOUNTER — Ambulatory Visit: Payer: 59

## 2021-08-18 DIAGNOSIS — M25512 Pain in left shoulder: Secondary | ICD-10-CM | POA: Diagnosis not present

## 2021-08-18 DIAGNOSIS — M25612 Stiffness of left shoulder, not elsewhere classified: Secondary | ICD-10-CM

## 2021-08-18 NOTE — Therapy (Signed)
[facility] [address] [place], [place], [address] Phone: [phone]   Fax:  [phone]  Physical Therapy Treatment  Patient Details  Name: Ryan Rojas MRN: [id number] Date of Birth: 08-01-1965 Referring Provider (PT): Roda Shutters, MD   Encounter Date: 08/18/2021   PT End of Session - 08/18/21 0824     Visit Number 8    Number of Visits 20    Date for PT Re-Evaluation 10/10/21    PT Start Time 0815    PT Stop Time 0857    PT Time Calculation (min) 42 min    Activity Tolerance Patient tolerated treatment well    Behavior During Therapy [facility] for tasks assessed/performed             Past Medical History:  Diagnosis Date   Hyperlipidemia     History reviewed. No pertinent surgical history.  There were no vitals filed for this visit.   Subjective Assessment - 08/18/21 5462     Subjective Patient reports that his shoulder is a little sore and stiff this morning, but not as bad as it was last night. He had a follow up with his referring physician and was told everything is on pace and he does not have to use the sling any more.    Pertinent History cannot grip with 4th and 5th fingers of left hand    Limitations House hold activities;Lifting    Patient Stated Goals use left arm, work on his car, yardwork,    Currently in Pain? Yes    Pain Score 3     Pain Location Shoulder    Pain Orientation Left    Pain Type Surgical pain    Pain Onset 1 to 4 weeks ago                               [facility] Adult PT Treatment/Exercise - 08/18/21 0001       Shoulder Exercises: Standing   Flexion AAROM;Left;20 reps   with cane     Shoulder Exercises: Pulleys   Flexion 2 minutes      Shoulder Exercises: ROM/Strengthening   UBE (Upper Arm Bike) 30 RPM 3 minutes forward and backward   AAROM   Other ROM/Strengthening Exercises Ball roll out   flexion (3 minutes) Abduction (3 minutes)     Shoulder Exercises: Isometric  Strengthening   Extension Other (comment)   2 minutes; 5 second hold   External Rotation Other (comment)   2 minutes; 5 second hold   Internal Rotation Other (comment)   2 minutes; 5 second hold     Shoulder Exercises: Stretch   Corner Stretch 10 seconds;Other (comment)   15 reps   Internal Rotation Stretch 10 seconds   15 reps                      PT Short Term Goals - 07/18/21 0925       PT SHORT TERM GOAL #1   Title Patient will be able to achieve at least 120 degrees of passive left shoulder flexion for improved shoulder moiblity.    Time 6    Period Weeks    Status New    Target Date 08/29/21      PT SHORT TERM GOAL #2   Title Patient will be able to achieve at least 120 degrees of passive left shoulder abduction for improved shoulder moiblity.    Time  6    Period Weeks    Status New    Target Date 08/29/21               PT Long Term Goals - 07/18/21 0920       PT LONG TERM GOAL #1   Title Patient will be independent with his HEP.    Time 10    Period Weeks    Status New    Target Date 09/26/21      PT LONG TERM GOAL #2   Title Patient will be able to demonstrate at least 120 degrees of active left shoulder flexion for improved function with overhead activities.    Time 10    Period Weeks    Status New    Target Date 09/26/21      PT LONG TERM GOAL #3   Title Patient will be able to demonstrate at least 120 degrees of active left shoulder abduction for improved function reaching overhead.    Time 10    Period Weeks    Status New    Target Date 09/26/21      PT LONG TERM GOAL #4   Title Patient will be able to lift at least 10 pounds overhead for improved function with household activities.    Time 10    Period Weeks    Status New    Target Date 09/26/01                   Plan - 08/18/21 0825     Clinical Impression Statement Patient was introduced to the UBE and light AAROM in addition to familiar interventions. He  required minimal cueing with ball roll outs and the UBE for light AAROM. He experienced a moderate increase in left shoulder discomfort with isometric ER. However, this improved after completing the UBE. He reported that his shoulder felt better upon the conclusion of treatment. He continues to require skilled physical therapy to address his remaining impairments to return to his prior level of function.    Personal Factors and Comorbidities Other;Profession    Examination-Activity Limitations Reach Overhead;Bathing;Self Feeding;Carry;Sleep;Dressing;Hygiene/Grooming;Lift    Examination-Participation Restrictions Occupation;Cleaning;Community Activity;Yard Work    Stability/Clinical Decision Making Stable/Uncomplicated    Rehab Potential Good    PT Frequency 2x / week    PT Duration Other (comment)    PT Treatment/Interventions ADLs/Self Care Home Management;Cryotherapy;Electrical Stimulation;Moist Heat;Neuromuscular re-education;Therapeutic exercise;Therapeutic activities;Functional mobility training;Patient/family education;Manual techniques;Passive range of motion;Taping;Vasopneumatic Device    PT Next Visit Plan PROM, scapular retraction, pendulums, and modalities as needed    Consulted and Agree with Plan of Care Patient             Patient will benefit from skilled therapeutic intervention in order to improve the following deficits and impairments:  Decreased range of motion, Impaired UE functional use, Decreased activity tolerance, Pain, Decreased strength  Visit Diagnosis: Acute pain of left shoulder  Stiffness of left shoulder, not elsewhere classified     Problem List Patient Active Problem List   Diagnosis Date Noted   Nontraumatic complete tear of left rotator cuff 05/28/2021   Impingement syndrome of left shoulder 05/28/2021   Tendinopathy of left biceps tendon 05/28/2021   Arm paresthesia, left 01/02/2021   Chronic right shoulder pain 01/02/2021   Other fatigue  01/02/2021   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   Neuropathic pain 09/30/2020   Left arm weakness 09/17/2020   Left arm pain 09/17/2020   Neck pain on left side 09/17/2020  Cubital tunnel syndrome on left 08/16/2020   Intractable neuropathic pain of lumbosacral origin 05/20/2020   Combined arterial insufficiency and corporo-venous occlusive erectile dysfunction 09/27/2019   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   Impotence 01/18/2018   Hyperlipidemia 07/16/2017    Granville Lewis, PT 08/18/2021, 12:32 PM  [facility] [address] [place], [place], [address] Phone: [phone]   Fax:  [phone]  Name: Spencer Cardinal MRN: [id number] Date of Birth: 1965-06-02

## 2021-08-21 ENCOUNTER — Ambulatory Visit: Payer: 59

## 2021-08-21 DIAGNOSIS — M25512 Pain in left shoulder: Secondary | ICD-10-CM

## 2021-08-21 DIAGNOSIS — M25612 Stiffness of left shoulder, not elsewhere classified: Secondary | ICD-10-CM

## 2021-08-21 NOTE — Therapy (Signed)
[facility] [place], [place], [address] Phone: [phone]   Fax:  [phone]  Physical Therapy Treatment  Patient Details  Name: Ryan Rojas MRN: [id number] Date of Birth: October 08, 1965 Referring Provider (PT): Erlinda Hong, MD   Encounter Date: 08/21/2021   PT End of Session - 08/21/21 [id number]     Visit Number 9    Number of Visits 20    Date for PT Re-Evaluation 10/10/21    PT Start Time 0816    PT Stop Time 0859    PT Time Calculation (min) 43 min    Activity Tolerance Patient tolerated treatment well    Behavior During Therapy [facility] for tasks assessed/performed             Past Medical History:  Diagnosis Date   Hyperlipidemia     No past surgical history on file.  There were no vitals filed for this visit.   Subjective Assessment - 08/21/21 0819     Subjective Patient reports that his shoulder feels fine this morning. He notes that both of his shoulders hurt a little after his last appointment because he did some more exercises after he left his appointment.    Pertinent History cannot grip with 4th and 5th fingers of left hand    Limitations House hold activities;Lifting    Patient Stated Goals use left arm, work on his car, yardwork,    Currently in Pain? Yes    Pain Score 2     Pain Location Shoulder    Pain Orientation Left    Pain Onset 1 to 4 weeks ago                               [facility] Adult PT Treatment/Exercise - 08/21/21 0001       Shoulder Exercises: Standing   External Rotation Left;20 reps;Theraband   isometric step out   Theraband Level (Shoulder External Rotation) Level 3 (Green)    Internal Rotation Left;20 reps;Theraband   isometric step out   Theraband Level (Shoulder Internal Rotation) Level 3 (Green)    Extension Left;Theraband;20 reps    Theraband Level (Shoulder Extension) Level 3 (Green)    Row [employer];Theraband    Theraband Level (Shoulder Row) Level 3  (Green)      Shoulder Exercises: Pulleys   Flexion 3 minutes    ABduction 3 minutes      Shoulder Exercises: ROM/Strengthening   UBE (Upper Arm Bike) 30 RPM 3 minutes forward and backward      Manual Therapy   Manual Therapy Joint mobilization;Soft tissue mobilization;Passive ROM    Joint Mobilization Grade I-III inferior glenohumeral    Soft tissue mobilization Left anterior shoulder musculature    Passive ROM PROM of L shoulder into flexion, ER/IR with gentle holds                       PT Short Term Goals - 08/21/21 0917       PT SHORT TERM GOAL #1   Title Patient will be able to achieve at least 120 degrees of passive left shoulder flexion for improved shoulder moiblity.    Time 6    Period Weeks    Status On-going    Target Date 08/29/21      PT SHORT TERM GOAL #2   Title Patient will be able to achieve at least 120 degrees of passive left shoulder abduction  for improved shoulder moiblity.    Time 6    Period Weeks    Status On-going    Target Date 08/29/21               PT Long Term Goals - 07/18/21 0920       PT LONG TERM GOAL #1   Title Patient will be independent with his HEP.    Time 10    Period Weeks    Status New    Target Date 09/26/21      PT LONG TERM GOAL #2   Title Patient will be able to demonstrate at least 120 degrees of active left shoulder flexion for improved function with overhead activities.    Time 10    Period Weeks    Status New    Target Date 09/26/21      PT LONG TERM GOAL #3   Title Patient will be able to demonstrate at least 120 degrees of active left shoulder abduction for improved function reaching overhead.    Time 10    Period Weeks    Status New    Target Date 09/26/21      PT LONG TERM GOAL #4   Title Patient will be able to lift at least 10 pounds overhead for improved function with household activities.    Time 10    Period Weeks    Status New    Target Date 09/26/01                    Plan - 08/21/21 [id number]     Clinical Impression Statement Patient was introduced to isometric step outs for light rotator cuff engagement. He required minimal cueing with these interventions to maintain glenohumeral stability to prevent any shoulder mobility. He reported no increase in pain or discomfort with any of today's interventions. Manual therapy focused on soft tissue mobilization to the anterior shoulder, inferior glenohumeral joint mobilizations, and PROM. He remains limited at approximately 95 degrees of flexion and abduction due to anterior shoulder tightness. He reported that his shoulder felt a lot better upon the conclusion of treatment. He continues to require skilled physical therapy to address his remaining impairments to return to his prior level of function.    Personal Factors and Comorbidities Other;Profession    Examination-Activity Limitations Reach Overhead;Bathing;Self Feeding;Carry;Sleep;Dressing;Hygiene/Grooming;Lift    Examination-Participation Restrictions Occupation;Cleaning;Community Activity;Yard Work    Stability/Clinical Decision Making Stable/Uncomplicated    Rehab Potential Good    PT Frequency 2x / week    PT Duration Other (comment)    PT Treatment/Interventions ADLs/Self Care Home Management;Cryotherapy;Electrical Stimulation;Moist Heat;Neuromuscular re-education;Therapeutic exercise;Therapeutic activities;Functional mobility training;Patient/family education;Manual techniques;Passive range of motion;Taping;Vasopneumatic Device    PT Next Visit Plan PROM, scapular retraction, pendulums, and modalities as needed    Consulted and Agree with Plan of Care Patient             Patient will benefit from skilled therapeutic intervention in order to improve the following deficits and impairments:  Decreased range of motion, Impaired UE functional use, Decreased activity tolerance, Pain, Decreased strength  Visit Diagnosis: Acute pain of left  shoulder  Stiffness of left shoulder, not elsewhere classified     Problem List Patient Active Problem List   Diagnosis Date Noted   Nontraumatic complete tear of left rotator cuff 05/28/2021   Impingement syndrome of left shoulder 05/28/2021   Tendinopathy of left biceps tendon 05/28/2021   Arm paresthesia, left 01/02/2021   Chronic right shoulder pain 01/02/2021  Other fatigue 01/02/2021   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   Neuropathic pain 09/30/2020   Left arm weakness 09/17/2020   Left arm pain 09/17/2020   Neck pain on left side 09/17/2020   Cubital tunnel syndrome on left 08/16/2020   Intractable neuropathic pain of lumbosacral origin 05/20/2020   Combined arterial insufficiency and corporo-venous occlusive erectile dysfunction 09/27/2019   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   Impotence 01/18/2018   Hyperlipidemia 07/16/2017   Rationale for Evaluation and Treatment Rehabilitation   Darlin Coco, PT 08/21/2021, 10:04 AM  [facility] [place], [place], [address] Phone: [phone]   Fax:  [phone]  Name: Ryan Rojas MRN: [id number] Date of Birth: 02-08-1966

## 2021-08-27 ENCOUNTER — Ambulatory Visit: Payer: 59

## 2021-08-27 DIAGNOSIS — M25612 Stiffness of left shoulder, not elsewhere classified: Secondary | ICD-10-CM

## 2021-08-27 DIAGNOSIS — M25512 Pain in left shoulder: Secondary | ICD-10-CM | POA: Diagnosis not present

## 2021-08-27 NOTE — Therapy (Signed)
[facility] [address] [place], [place], [address] Phone: [phone]   Fax:  [phone]  Physical Therapy Treatment  Patient Details  Name: Ryan Rojas MRN: [id number] Date of Birth: 02/12/1966 Referring Provider (PT): Roda ShuttersXu, MD   Encounter Date: 08/27/2021   PT End of Session - 08/27/21 0819     Visit Number 10    Number of Visits 20    Date for PT Re-Evaluation 10/10/21    PT Start Time 0815    PT Stop Time 0900    PT Time Calculation (min) 45 min    Activity Tolerance Patient tolerated treatment well    Behavior [facility] for tasks assessed/performed             Past Medical History:  Diagnosis Date   Hyperlipidemia     History reviewed. No pertinent surgical history.  There were no vitals filed for this visit.   Subjective Assessment - 08/27/21 0817     Subjective Patient reports that his shoulder was a little tight this morning, but using his ice when he first gets up helps.    Pertinent History cannot grip with 4th and 5th fingers of left hand    Limitations House hold activities;Lifting    Patient Stated Goals use left arm, work on his car, yardwork,    Currently in Pain? Yes    Pain Score 3     Pain Location Shoulder    Pain Orientation Left    Pain Descriptors / Indicators Discomfort    Pain Type Surgical pain    Pain Onset 1 to 4 weeks ago                [facility] - 08/27/21 0001       AROM   Right/Left Shoulder Left    Left Shoulder Flexion 93 Degrees   AA with pulleys     PROM   Overall PROM Comments Increased pain returning to neutral    Left Shoulder Flexion 98 Degrees    Left Shoulder ABduction 76 Degrees    Left Shoulder External Rotation 17 Degrees                           [facility] Adult PT Treatment/Exercise - 08/27/21 0001       Shoulder Exercises: Supine   Other Supine Exercises Cane press up   AAROM; 10 reps     Shoulder Exercises:  Standing   Extension Both   3 minutes   Extension Weight (lbs) Blue XTS      Shoulder Exercises: Pulleys   Flexion 5 minutes      Manual Therapy   Manual Therapy Joint mobilization;Soft tissue mobilization;Passive ROM    Joint Mobilization Grade I-IV inferior and posterior glenohumeral    Soft tissue mobilization Left anterior shoulder musculature    Passive ROM flexion, abduction, and ER to tolerance                       PT Short Term Goals - 08/27/21 1104       PT SHORT TERM GOAL #1   Title Patient will be able to achieve at least 120 degrees of passive left shoulder flexion for improved shoulder moiblity.    Time 6    Period Weeks    Status On-going    Target Date 08/29/21      PT SHORT TERM GOAL #2   Title  Patient will be able to achieve at least 120 degrees of passive left shoulder abduction for improved shoulder moiblity.    Time 6    Period Weeks    Status On-going    Target Date 08/29/21               PT Long Term Goals - 08/27/21 1104       PT LONG TERM GOAL #1   Title Patient will be independent with his HEP.    Time 10    Period Weeks    Status On-going    Target Date 09/26/21      PT LONG TERM GOAL #2   Title Patient will be able to demonstrate at least 120 degrees of active left shoulder flexion for improved function with overhead activities.    Time 10    Period Weeks    Status On-going    Target Date 09/26/21      PT LONG TERM GOAL #3   Title Patient will be able to demonstrate at least 120 degrees of active left shoulder abduction for improved function reaching overhead.    Time 10    Period Weeks    Status On-going    Target Date 09/26/21      PT LONG TERM GOAL #4   Title Patient will be able to lift at least 10 pounds overhead for improved function with household activities.    Time 10    Period Weeks    Status On-going    Target Date 09/26/01                   Plan - 08/27/21 0819     Clinical  Impression Statement Patient is makig fair progress with skilled physical therapy his ROM continues to be limited due to increased left shoulder pain and discomfort. He also continues to experience increased left shoulder pain and muscle guarding with PROM with most of his pain occuring at end range and returning his arm to neutral. Treatment focused on manual therapy with soft tissue mobilization to the bicep, subscapularis, and pectoral musculature, glenohumeral and AC joint mobilizations, and PROM. This was slightly effective at improving his pain and mobility as he reported that his shoulder felt good upon the conclusion of treatment. He continues to require skilled physical therapy to address his remaining impairments to return to his prior level of function.    Personal Factors and Comorbidities Other;Profession    Examination-Activity Limitations Reach Overhead;Bathing;Self Feeding;Carry;Sleep;Dressing;Hygiene/Grooming;Lift    Examination-Participation Restrictions Occupation;Cleaning;Community Activity;Yard Work    Stability/Clinical Decision Making Stable/Uncomplicated    Rehab Potential Good    PT Frequency 2x / week    PT Duration Other (comment)    PT Treatment/Interventions ADLs/Self Care Home Management;Cryotherapy;Electrical Stimulation;Moist Heat;Neuromuscular re-education;Therapeutic exercise;Therapeutic activities;Functional mobility training;Patient/family education;Manual techniques;Passive range of motion;Taping;Vasopneumatic Device    PT Next Visit Plan PROM, scapular retraction, pendulums, and modalities as needed    Consulted and Agree with Plan of Care Patient             Patient will benefit from skilled therapeutic intervention in order to improve the following deficits and impairments:  Decreased range of motion, Impaired UE functional use, Decreased activity tolerance, Pain, Decreased strength  Visit Diagnosis: Acute pain of left shoulder  Stiffness of left  shoulder, not elsewhere classified     Problem List Patient Active Problem List   Diagnosis Date Noted   Nontraumatic complete tear of left rotator cuff 05/28/2021   Impingement syndrome  of left shoulder 05/28/2021   Tendinopathy of left biceps tendon 05/28/2021   Arm paresthesia, left 01/02/2021   Chronic right shoulder pain 01/02/2021   Other fatigue 01/02/2021   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   Neuropathic pain 09/30/2020   Left arm weakness 09/17/2020   Left arm pain 09/17/2020   Neck pain on left side 09/17/2020   Cubital tunnel syndrome on left 08/16/2020   Intractable neuropathic pain of lumbosacral origin 05/20/2020   Combined arterial insufficiency and corporo-venous occlusive erectile dysfunction 09/27/2019   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   Impotence 01/18/2018   Hyperlipidemia 07/16/2017   Rationale for Evaluation and Treatment Rehabilitation   Granville Lewis, PT 08/27/2021, 11:14 AM  [facility] [address] [place], [place], [address] Phone: [phone]   Fax:  [phone]  Name: Ryan Rojas MRN: [id number] Date of Birth: [birth year]  Progress Note Reporting Period 07/18/21 to 08/27/21  See note below for Objective Data and Assessment of Progress/Goals.   See clinical impression statement

## 2021-09-02 ENCOUNTER — Ambulatory Visit: Payer: 59

## 2021-09-02 DIAGNOSIS — M25512 Pain in left shoulder: Secondary | ICD-10-CM

## 2021-09-02 DIAGNOSIS — M25612 Stiffness of left shoulder, not elsewhere classified: Secondary | ICD-10-CM

## 2021-09-02 NOTE — Therapy (Signed)
[facility] [address] [place], [place], [address] Phone: [phone]   Fax:  [phone]  Physical Therapy Treatment  Patient Details  Name: Ryan Rojas MRN: [id number] Date of Birth: 28-Dec-1965 Referring Provider (PT): Roda Shutters, MD   Encounter Date: 09/02/2021   PT End of Session - 09/02/21 0819     Visit Number 11    Number of Visits 20    Date for PT Re-Evaluation 10/10/21    PT Start Time 0815    PT Stop Time 0857    PT Time Calculation (min) 42 min    Activity Tolerance Patient tolerated treatment well    Behavior During Therapy [facility] for tasks assessed/performed             Past Medical History:  Diagnosis Date   Hyperlipidemia     History reviewed. No pertinent surgical history.  There were no vitals filed for this visit.   Subjective Assessment - 09/02/21 0817     Subjective Patient reports that his shoulder feels alright right now, but he had to use ice on his shoulder before treatment. He notes that his shoulder was about a 2-3/10 prior to using ice this morning.    Pertinent History cannot grip with 4th and 5th fingers of left hand    Limitations House hold activities;Lifting    Patient Stated Goals use left arm, work on his car, yardwork,    Currently in Pain? No/denies    Pain Onset 1 to 4 weeks ago                               [facility] Adult PT Treatment/Exercise - 09/02/21 0001       Shoulder Exercises: Supine   External Rotation Left;AAROM;20 reps    Flexion AAROM;Both;20 reps    Other Supine Exercises Cane press up   AAROM; 20 reps     Shoulder Exercises: Standing   Extension Both   2 minutes   Extension Weight (lbs) Blue XTS    Row Both;20 reps;10 reps    Row Weight (lbs) Blue XTS      Shoulder Exercises: Pulleys   Flexion 5 minutes      Shoulder Exercises: ROM/Strengthening   UBE (Upper Arm Bike) 60 RPM x 10 minutes   for light AAROM   Ranger Seated; flexion and circles x  3 minutes      Shoulder Exercises: Isometric Strengthening   Extension Other (comment)   2 minutes; 5 second hold                      PT Short Term Goals - 08/27/21 1104       PT SHORT TERM GOAL #1   Title Patient will be able to achieve at least 120 degrees of passive left shoulder flexion for improved shoulder moiblity.    Time 6    Period Weeks    Status On-going    Target Date 08/29/21      PT SHORT TERM GOAL #2   Title Patient will be able to achieve at least 120 degrees of passive left shoulder abduction for improved shoulder moiblity.    Time 6    Period Weeks    Status On-going    Target Date 08/29/21               PT Long Term Goals - 08/27/21 1104       PT  LONG TERM GOAL #1   Title Patient will be independent with his HEP.    Time 10    Period Weeks    Status On-going    Target Date 09/26/21      PT LONG TERM GOAL #2   Title Patient will be able to demonstrate at least 120 degrees of active left shoulder flexion for improved function with overhead activities.    Time 10    Period Weeks    Status On-going    Target Date 09/26/21      PT LONG TERM GOAL #3   Title Patient will be able to demonstrate at least 120 degrees of active left shoulder abduction for improved function reaching overhead.    Time 10    Period Weeks    Status On-going    Target Date 09/26/21      PT LONG TERM GOAL #4   Title Patient will be able to lift at least 10 pounds overhead for improved function with household activities.    Time 10    Period Weeks    Status On-going    Target Date 09/26/01                   Plan - 09/02/21 0819     Clinical Impression Statement Patient was introduced to new AAROM interventions for improved shoulder mobility. He experienced a mild increase in left shoulder pain with eecentric control while performing AA shoulder flexion with a cane. He reported that this discomfort primarily halfway through his ROM with it  easing afterward. Treatment then focused on scapular and glenhumeral stabilization to promote shoulder stability needed for functional mobiltiy. He reported that his shoulder felt good upon the conclusion of therapy. He continues to require skilled physical therapy to address his remaining impairments to return to his prior level of function.    Personal Factors and Comorbidities Other;Profession    Examination-Activity Limitations Reach Overhead;Bathing;Self Feeding;Carry;Sleep;Dressing;Hygiene/Grooming;Lift    Examination-Participation Restrictions Occupation;Cleaning;Community Activity;Yard Work    Stability/Clinical Decision Making Stable/Uncomplicated    Rehab Potential Good    PT Frequency 2x / week    PT Duration Other (comment)    PT Treatment/Interventions ADLs/Self Care Home Management;Cryotherapy;Electrical Stimulation;Moist Heat;Neuromuscular re-education;Therapeutic exercise;Therapeutic activities;Functional mobility training;Patient/family education;Manual techniques;Passive range of motion;Taping;Vasopneumatic Device    PT Next Visit Plan PROM, scapular retraction, pendulums, and modalities as needed    Consulted and Agree with Plan of Care Patient             Patient will benefit from skilled therapeutic intervention in order to improve the following deficits and impairments:  Decreased range of motion, Impaired UE functional use, Decreased activity tolerance, Pain, Decreased strength  Visit Diagnosis: Acute pain of left shoulder  Stiffness of left shoulder, not elsewhere classified     Problem List Patient Active Problem List   Diagnosis Date Noted   Nontraumatic complete tear of left rotator cuff 05/28/2021   Impingement syndrome of left shoulder 05/28/2021   Tendinopathy of left biceps tendon 05/28/2021   Arm paresthesia, left 01/02/2021   Chronic right shoulder pain 01/02/2021   Other fatigue 01/02/2021   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   Neuropathic pain  09/30/2020   Left arm weakness 09/17/2020   Left arm pain 09/17/2020   Neck pain on left side 09/17/2020   Cubital tunnel syndrome on left 08/16/2020   Intractable neuropathic pain of lumbosacral origin 05/20/2020   Combined arterial insufficiency and corporo-venous occlusive erectile dysfunction 09/27/2019   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  Impotence 01/18/2018   Hyperlipidemia 07/16/2017   Rationale for Evaluation and Treatment Rehabilitation   Granville Lewis, PT 09/02/2021, 12:37 PM  [facility] [address] [place], [place], [address] Phone: [phone]   Fax:  [phone]  Name: Ryan Rojas MRN: [id number] Date of Birth: 1966-03-04

## 2021-09-04 ENCOUNTER — Ambulatory Visit: Payer: 59

## 2021-09-04 DIAGNOSIS — M25512 Pain in left shoulder: Secondary | ICD-10-CM | POA: Diagnosis not present

## 2021-09-04 DIAGNOSIS — M25612 Stiffness of left shoulder, not elsewhere classified: Secondary | ICD-10-CM

## 2021-09-04 NOTE — Therapy (Signed)
[facility] [address] [place], [place], [address] Phone: [phone]   Fax:  [phone]  Physical Therapy Treatment  Patient Details  Name: Ryan Rojas MRN: [id number] Date of Birth: [birth year] Referring Provider (PT): Roda Shutters, MD   Encounter Date: 09/04/2021   PT End of Session - 09/04/21 0817     Visit Number 12    Number of Visits 20    Date for PT Re-Evaluation 10/10/21    PT Start Time 0816    PT Stop Time 0905    PT Time Calculation (min) 49 min    Activity Tolerance Patient tolerated treatment well    Behavior During Therapy [facility] for tasks assessed/performed             Past Medical History:  Diagnosis Date   Hyperlipidemia     History reviewed. No pertinent surgical history.  There were no vitals filed for this visit.   Subjective Assessment - 09/04/21 0816     Subjective Patient reports that his shoulder feels alright today and he has not had any problems since his last appointment.    Pertinent History cannot grip with 4th and 5th fingers of left hand    Limitations House hold activities;Lifting    Patient Stated Goals use left arm, work on his car, yardwork,    Currently in Pain? No/denies    Pain Onset 1 to 4 weeks ago                               [facility] Adult PT Treatment/Exercise - 09/04/21 0001       Shoulder Exercises: Supine   Flexion AAROM;Both;10 reps    Other Supine Exercises Cane press up and press up with flexion   20 reps AAROM     Shoulder Exercises: Standing   Row Both;20 reps;10 reps    Row Weight (lbs) Blue XTS    Other Standing Exercises Adduction   2 minutes; Blue XTS     Shoulder Exercises: Pulleys   Flexion 5 minutes      Shoulder Exercises: ROM/Strengthening   UBE (Upper Arm Bike) 60 RPM x 10 minutes      Manual Therapy   Manual Therapy Joint mobilization;Soft tissue mobilization;Passive ROM    Joint Mobilization Grade I-IV lateral distraction and  inferior glenohumeral    Soft tissue mobilization Left anterior shoulder musculature    Passive ROM abduction to tolerance                     PT Education - 09/04/21 0908     Education Details healing, progress with therapy    Person(s) Educated Patient    Methods Explanation    Comprehension Verbalized understanding              PT Short Term Goals - 08/27/21 1104       PT SHORT TERM GOAL #1   Title Patient will be able to achieve at least 120 degrees of passive left shoulder flexion for improved shoulder moiblity.    Time 6    Period Weeks    Status On-going    Target Date 08/29/21      PT SHORT TERM GOAL #2   Title Patient will be able to achieve at least 120 degrees of passive left shoulder abduction for improved shoulder moiblity.    Time 6    Period Weeks    Status On-going  Target Date 08/29/21               PT Long Term Goals - 08/27/21 1104       PT LONG TERM GOAL #1   Title Patient will be independent with his HEP.    Time 10    Period Weeks    Status On-going    Target Date 09/26/21      PT LONG TERM GOAL #2   Title Patient will be able to demonstrate at least 120 degrees of active left shoulder flexion for improved function with overhead activities.    Time 10    Period Weeks    Status On-going    Target Date 09/26/21      PT LONG TERM GOAL #3   Title Patient will be able to demonstrate at least 120 degrees of active left shoulder abduction for improved function reaching overhead.    Time 10    Period Weeks    Status On-going    Target Date 09/26/21      PT LONG TERM GOAL #4   Title Patient will be able to lift at least 10 pounds overhead for improved function with household activities.    Time 10    Period Weeks    Status On-going    Target Date 09/26/01                   Plan - 09/04/21 0817     Clinical Impression Statement Patient was introduced to resisted shoulder adduction in addition to familiar  interventions for improved shoulder mobility. He required minimal cueing with resisted rows to facilitate scapular stability. His left hand limited his ability to perform interventions that involved grasping. Manual therapy focused on glenohumeral joint mobilizations which were moderately effective at reducing his familiar pain with eccentric control. He reported that his shoulder felt alright upon the conclusion of treatment. He continues to require skilled physical therapy to address his remaining impairments to return to his prior level of function.    Personal Factors and Comorbidities Other;Profession    Examination-Activity Limitations Reach Overhead;Bathing;Self Feeding;Carry;Sleep;Dressing;Hygiene/Grooming;Lift    Examination-Participation Restrictions Occupation;Cleaning;Community Activity;Yard Work    Stability/Clinical Decision Making Stable/Uncomplicated    Rehab Potential Good    PT Frequency 2x / week    PT Duration Other (comment)    PT Treatment/Interventions ADLs/Self Care Home Management;Cryotherapy;Electrical Stimulation;Moist Heat;Neuromuscular re-education;Therapeutic exercise;Therapeutic activities;Functional mobility training;Patient/family education;Manual techniques;Passive range of motion;Taping;Vasopneumatic Device    PT Next Visit Plan PROM, scapular retraction, pendulums, and modalities as needed    Consulted and Agree with Plan of Care Patient             Patient will benefit from skilled therapeutic intervention in order to improve the following deficits and impairments:  Decreased range of motion, Impaired UE functional use, Decreased activity tolerance, Pain, Decreased strength  Visit Diagnosis: Acute pain of left shoulder  Stiffness of left shoulder, not elsewhere classified     Problem List Patient Active Problem List   Diagnosis Date Noted   Nontraumatic complete tear of left rotator cuff 05/28/2021   Impingement syndrome of left shoulder 05/28/2021    Tendinopathy of left biceps tendon 05/28/2021   Arm paresthesia, left 01/02/2021   Chronic right shoulder pain 01/02/2021   Other fatigue 01/02/2021   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   Neuropathic pain 09/30/2020   Left arm weakness 09/17/2020   Left arm pain 09/17/2020   Neck pain on left side 09/17/2020   Cubital tunnel syndrome  on left 08/16/2020   Intractable neuropathic pain of lumbosacral origin 05/20/2020   Combined arterial insufficiency and corporo-venous occlusive erectile dysfunction 09/27/2019   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   Impotence 01/18/2018   Hyperlipidemia 07/16/2017   Rationale for Evaluation and Treatment Rehabilitation   Granville Lewis, PT 09/04/2021, 9:12 AM  [facility] [address] [place], [place], [address] Phone: [phone]   Fax:  [phone]  Name: Ryan Rojas MRN: [id number] Date of Birth: 1965/05/22

## 2021-09-08 ENCOUNTER — Encounter: Payer: Self-pay | Admitting: Orthopaedic Surgery

## 2021-09-08 ENCOUNTER — Encounter: Payer: Self-pay | Admitting: Physical Therapy

## 2021-09-08 ENCOUNTER — Other Ambulatory Visit: Payer: Self-pay | Admitting: Orthopaedic Surgery

## 2021-09-08 ENCOUNTER — Ambulatory Visit: Payer: 59 | Admitting: Physical Therapy

## 2021-09-08 DIAGNOSIS — M25512 Pain in left shoulder: Secondary | ICD-10-CM

## 2021-09-08 DIAGNOSIS — M25612 Stiffness of left shoulder, not elsewhere classified: Secondary | ICD-10-CM

## 2021-09-08 MED ORDER — ZOLPIDEM TARTRATE 5 MG PO TABS: 5.0000 mg | ORAL_TABLET | Freq: Every evening | ORAL | 0 refills | Status: DC | PRN

## 2021-09-08 NOTE — Therapy (Signed)
[facility] [address] [place], [place], [address] Phone: [phone]   Fax:  [phone]  Physical Therapy Treatment  Patient Details  Name: Ryan Rojas MRN: [id number] Date of Birth: 04-11-1965 Referring Provider (PT): Roda Shutters, MD   Encounter Date: 09/08/2021   PT End of Session - 09/08/21 0834     Visit Number 13    Number of Visits 20    Date for PT Re-Evaluation 10/10/21    PT Start Time 0830    PT Stop Time 0900    PT Time Calculation (min) 30 min    Activity Tolerance Patient tolerated treatment well    Behavior During Therapy [facility] for tasks assessed/performed             Past Medical History:  Diagnosis Date   Hyperlipidemia     History reviewed. No pertinent surgical history.  There were no vitals filed for this visit.   Subjective Assessment - 09/08/21 0832     Subjective Reports he is having more pain but bilateral shoulders (6/10) last night.    Pertinent History cannot grip with 4th and 5th fingers of left hand    Limitations House hold activities;Lifting    Patient Stated Goals use left arm, work on his car, yardwork,    Currently in Pain? Yes    Pain Score 4     Pain Location Shoulder    Pain Orientation Left    Pain Descriptors / Indicators Discomfort    Pain Type Surgical pain    Pain Onset More than a month ago    Pain Frequency Intermittent                [facility] PT Assessment - 09/08/21 0001       Assessment   Medical Diagnosis Nontraumatic complete tear of left rotator cuff    Referring Provider (PT) Roda Shutters, MD    Onset Date/Surgical Date 07/10/21    Hand Dominance Left    Prior Therapy No      Precautions   Precautions Shoulder    Type of Shoulder Precautions Left RTC repair                           [facility] Adult PT Treatment/Exercise - 09/08/21 0001       Shoulder Exercises: Seated   Other Seated Exercises L upper cut x20 rep      Shoulder Exercises: Standing    Protraction Strengthening;Left;20 reps;Theraband    Theraband Level (Shoulder Protraction) Level 2 (Red)    Protraction Limitations assist from RUE    External Rotation Strengthening;Left;20 reps;Theraband    Theraband Level (Shoulder External Rotation) Level 2 (Red)    Internal Rotation Strengthening;Left;20 reps;Theraband    Theraband Level (Shoulder Internal Rotation) Level 2 (Red)    Extension Strengthening;Both;20 reps   Blue XTS   Row Strengthening;Both;20 reps   Blue XTS   Other Standing Exercises L shoulder aduct blue XTS x20 reps      Shoulder Exercises: Pulleys   Flexion 5 minutes      Shoulder Exercises: ROM/Strengthening   UBE (Upper Arm Bike) 120 RPM x8 min    Ranger standing; flex x20 reps, CW circles x20 reps                       PT Short Term Goals - 08/27/21 1104       PT SHORT TERM GOAL #1   Title Patient  will be able to achieve at least 120 degrees of passive left shoulder flexion for improved shoulder moiblity.    Time 6    Period Weeks    Status On-going    Target Date 08/29/21      PT SHORT TERM GOAL #2   Title Patient will be able to achieve at least 120 degrees of passive left shoulder abduction for improved shoulder moiblity.    Time 6    Period Weeks    Status On-going    Target Date 08/29/21               PT Long Term Goals - 08/27/21 1104       PT LONG TERM GOAL #1   Title Patient will be independent with his HEP.    Time 10    Period Weeks    Status On-going    Target Date 09/26/21      PT LONG TERM GOAL #2   Title Patient will be able to demonstrate at least 120 degrees of active left shoulder flexion for improved function with overhead activities.    Time 10    Period Weeks    Status On-going    Target Date 09/26/21      PT LONG TERM GOAL #3   Title Patient will be able to demonstrate at least 120 degrees of active left shoulder abduction for improved function reaching overhead.    Time 10    Period Weeks     Status On-going    Target Date 09/26/21      PT LONG TERM GOAL #4   Title Patient will be able to lift at least 10 pounds overhead for improved function with household activities.    Time 10    Period Weeks    Status On-going    Target Date 09/26/01                   Plan - 09/08/21 0910     Clinical Impression Statement Patient presented in clinic with reports of difficulty sleeping and having more pain over the last few nights. Patient continues to demonstrate limited L hand use and continued assist from RUE due to limitations of L hand. Patient progressed through ROM and light strengthening with most difficulty with AAROM flexion such as with UE ranger and resisted protraction. Minimal to no limitations observed with L upper cuts.    Personal Factors and Comorbidities Other;Profession    Examination-Activity Limitations Reach Overhead;Bathing;Self Feeding;Carry;Sleep;Dressing;Hygiene/Grooming;Lift    Examination-Participation Restrictions Occupation;Cleaning;Community Activity;Yard Work    Stability/Clinical Decision Making Stable/Uncomplicated    Rehab Potential Good    PT Frequency 2x / week    PT Duration Other (comment)    PT Treatment/Interventions ADLs/Self Care Home Management;Cryotherapy;Electrical Stimulation;Moist Heat;Neuromuscular re-education;Therapeutic exercise;Therapeutic activities;Functional mobility training;Patient/family education;Manual techniques;Passive range of motion;Taping;Vasopneumatic Device    PT Next Visit Plan PROM, scapular retraction, pendulums, and modalities as needed    Consulted and Agree with Plan of Care Patient             Patient will benefit from skilled therapeutic intervention in order to improve the following deficits and impairments:  Decreased range of motion, Impaired UE functional use, Decreased activity tolerance, Pain, Decreased strength  Visit Diagnosis: Acute pain of left shoulder  Stiffness of left shoulder, not  elsewhere classified     Problem List Patient Active Problem List   Diagnosis Date Noted   Nontraumatic complete tear of left rotator cuff 05/28/2021   Impingement  syndrome of left shoulder 05/28/2021   Tendinopathy of left biceps tendon 05/28/2021   Arm paresthesia, left 01/02/2021   Chronic right shoulder pain 01/02/2021   Other fatigue 01/02/2021   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   Neuropathic pain 09/30/2020   Left arm weakness 09/17/2020   Left arm pain 09/17/2020   Neck pain on left side 09/17/2020   Cubital tunnel syndrome on left 08/16/2020   Intractable neuropathic pain of lumbosacral origin 05/20/2020   Combined arterial insufficiency and corporo-venous occlusive erectile dysfunction 09/27/2019   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   Impotence 01/18/2018   Hyperlipidemia 07/16/2017   Rationale for Evaluation and Treatment Rehabilitation   Marvell Fuller, [place] 09/08/2021, 9:14 AM  [facility] [address] [place], [place], [address] Phone: [phone]   Fax:  [phone]  Name: Ryan Rojas MRN: [id number] Date of Birth: Aug 31, 1965

## 2021-09-11 ENCOUNTER — Encounter: Payer: Self-pay | Admitting: Neurology

## 2021-09-11 ENCOUNTER — Ambulatory Visit (INDEPENDENT_AMBULATORY_CARE_PROVIDER_SITE_OTHER): Payer: 59 | Admitting: Neurology

## 2021-09-11 VITALS — BP 125/85 | HR 83 | Ht 72.0 in | Wt 246.5 lb

## 2021-09-11 DIAGNOSIS — G4733 Obstructive sleep apnea (adult) (pediatric): Secondary | ICD-10-CM | POA: Diagnosis not present

## 2021-09-11 DIAGNOSIS — R202 Paresthesia of skin: Secondary | ICD-10-CM

## 2021-09-11 DIAGNOSIS — G545 Neuralgic amyotrophy: Secondary | ICD-10-CM

## 2021-09-11 MED ORDER — DULOXETINE HCL 60 MG PO CPEP: 60.0000 mg | ORAL_CAPSULE | Freq: Every day | ORAL | 6 refills | Status: DC

## 2021-09-11 MED ORDER — CELECOXIB 100 MG PO CAPS: 100.0000 mg | ORAL_CAPSULE | Freq: Two times a day (BID) | ORAL | 3 refills | Status: DC | PRN

## 2021-09-11 NOTE — Progress Notes (Signed)
ASSESSMENT AND PLAN  Ryan Rojas is a 56 y.o. male   Left lower cervical brachial plexopathy Presented with subacute onset left ulnar arm, left chest pain, followed by weakness since early June 2022  In the dermatome of left C8, T1,  EMG nerve conduction study in August 2022 demonstrate left lower brachial plexopathy, involving left C8-T1 mild involvement of C7  MRI left shoulder in July 2022 showed full-thickness incomplete tearing of the anterior supraspinatus tendon, which would not explain his current difficulty  MRI of cervical also demonstrated C5-6, C6-7 degenerative disease, with moderate canal stenosis  Overall improved, but unfortunately complicated by his left shoulder rotator cuff disease, with laparoscopic procedure in March 2023,'= Worsening right shoulder pain New onset right hand paresthesia  MRI of right shoulder in November 2022 confirmed significant right shoulder pathology, more laparoscopic procedures pending  He has right elbow Tinel's sign, evidence of cervical degenerative disease, might all contributed to his current complaints of right hand paresthesia,  Patient desires further evaluation, proceed with EMG nerve conduction study   Poor sleep quality, frequent awakening, catching for air, dry mouth  High risk for obstructive sleep apnea, very narrow oropharyngeal space, obesity,  Refer to sleep study   DIAGNOSTIC DATA (LABS, IMAGING, TESTING) - I reviewed patient records, labs, notes, testing and imaging myself where available.  Laboratory evaluation on September 05, 2020, normal CMP, creatinine one 0.4, negative troponin, normal CBC, hemoglobin of 14.6,  HISTORICAL  [name] is a 56 year old left handed male, seen in request by orthopedic surgeon Dr. Roda Shutters, [place] and his primary care physician Stacks, Broadus John, for evaluation of subacute onset left arm pain, left anterior chest muscle pain, initial evaluation was on September 17, 2020.  I reviewed and summarized  the referring note. PMHX HLD,  Low back pain,   He works as Armed forces technical officer, spent most of the time writing, working on [employer], he woke up 1 day in the middle of May 2022, noticed numbness tingling painful sensation at the left ulnar forearm, extending to left fourth and the fifth fingers,  His symptoms continue to progress in the following days, later on the neuropathic pain spread to left arm involving left armpit, left anterior chest, he described significant sharp shooting pain, worry about the heart attack, he presented to local emergency room, there was no cardiac etiology found to explain his left chest inner arm discomfort  About a week later, he began to noticed left hand weakness, difficulty to extend his left lateral to 3 fingers, weak grip,  Over the past couple months, his significant pain has much improved, but he still has intermittent flareup of the pain, especially when he coughs, moves suddenly, he would have such radiating pain at the left armpit to his left anterior chest, left ulnar arm and forearm, he continues to have weakness of left hands, mild improvement with Lyrica 200 mg daily  He denies gait abnormality, he denies bowel bladder incontinence, he denies right upper extremity involvement  Electrodiagnostic study in August 2022 confirmed diagnosis of left lower brachial plexopathy, mainly involving C8-T1, with minor involvement of C7,  He overall has not improved with, unfortunately he began to experience increased left and right shoulder pain,  MRI of left shoulder showed full-thickness incomplete tearing of the anterior supraspinatus tendon insertion with up to 7 mm of tendon retraction, long head biceps tendon is medially perched at the group entry zone with focal tendinosis,  He was seen by orthopedic surgeon Dr. Roda Shutters, nontraumatic  complete tear of left rotator cuff, underwent laparoscopic surgery in March 2023, followed by physical therapy, with some  improvement range of motion of left shoulder, continue to have left hand numbness, weakness  Now he complains of increased right shoulder pain, MRI of right shoulder in November 2022 showed moderate tendinosis of the supraspinatus tendon with a small partial-thickness articular surface tear, mild tendinosis of the infraspinatus tendon, partial-thickness tear of the peripheral fibers of the subscapularis tendon, partial-thickness cartilage loss of the glenohumeral joint with area of high-grade partial-thickness cartilage loss of the inferior glenoid with subchondral reactive marrow change  More right shoulder orthopedic intervention is pending, since April 2023, he noticed intermittent episode of right hand numbness tingling, mainly involving 3rd-5th finger, no persistent sensory loss, no weakness  Also complains of increased left knee pain, no bowel or bladder incontinence  We personally reviewed MRI of cervical spine November 2022, multilevel degenerative changes, prominent disc osteophyte diffuse bulging at C5-6 with moderate by foraminal stenosis, C6-7 also showed moderate left foraminal stenosis, mild canal stenosis no spinal cord compression  In addition, he complains of poor sleep quality, frequent snoring, wake up in the middle of the night, sometimes to the point of difficulty catching his breath To use inhaler, excessive daytime sleepiness, fatigue  PHYSICAL EXAM:   Today's Vitals   09/11/21 0743  BP: 125/85  Pulse: 83  Weight: 246 lb 8 oz (111.8 kg)  Height: 6' (1.829 m)   Body mass index is 33.43 kg/m.    Gen: NAD, conversant, well nourised, well groomed          NEUROLOGICAL EXAM:  MENTAL STATUS: Speech/cognition: Awake, alert, oriented to history taking and casual conversation   CRANIAL NERVES: CN II: Visual fields are full to confrontation. Pupils are round equal and briskly reactive to light. CN III, IV, VI: extraocular movement are normal. No ptosis. CN V: Facial  sensation is intact to light touch CN VII: Face is symmetric with normal eye closure  CN VIII: Hearing is normal to causal conversation. CN IX, X: Phonation is normal. CN XI: Head turning and shoulder shrug are intact CN XII: Narrow oropharyngeal space  MOTOR: Left proximal arm motor examination is limited following left shoulder laparoscopic surgery, in splint, left hand atrophy, mild left wrist extension, flexion weakness, moderate grip weakness  Right upper extremity and bilateral lower extremity strengths was within normal limit, limited by his left knee pain  REFLEXES: Reflexes are1 and symmetric at the biceps, triceps, knees, and ankles. Plantar responses are flexor.  SENSORY: Intact to light touch, pinprick and vibratory sensation are intact in fingers and toes.  COORDINATION: There is no trunk or limb dysmetria noted.  GAIT/STANCE: He needs push-up to get up from seated position, mildly antalgic  REVIEW OF SYSTEMS:  Full 14 system review of systems performed and notable only for as above All other review of systems were negative.   ALLERGIES: Allergies  Allergen Reactions   Hydrocodone Itching   Oxycodone Hives    HOME MEDICATIONS: Current Outpatient Medications  Medication Sig Dispense Refill   atorvastatin (LIPITOR) 40 MG tablet Take 1 tablet (40 mg total) by mouth daily. 90 tablet 3   diphenhydrAMINE HCl (BENADRYL ALLERGY PO) Take by mouth as needed.     HYDROmorphone (DILAUDID) 2 MG tablet Take 1 tablet (2 mg total) by mouth at bedtime as needed for severe pain. 20 tablet 0   ondansetron (ZOFRAN) 4 MG tablet Take 1 tablet (4 mg total) by mouth every  8 (eight) hours as needed for nausea or vomiting. 40 tablet 0   sildenafil (VIAGRA) 100 MG tablet TAKE 0.5-1 TABLETS BY MOUTH DAILY AS NEEDED FOR ERECTILE DYSFUNCTION. 6 tablet 7   zolpidem (AMBIEN) 5 MG tablet Take 1 tablet (5 mg total) by mouth at bedtime as needed for sleep. 30 tablet 0   No current  facility-administered medications for this visit.    PAST MEDICAL HISTORY: Past Medical History:  Diagnosis Date   Hyperlipidemia     PAST SURGICAL HISTORY: History reviewed. No pertinent surgical history.  FAMILY HISTORY: Family History  Problem Relation Age of Onset   Asthma Mother    Diabetes Sister    Asthma Sister     SOCIAL HISTORY: Social History   Socioeconomic History   Marital status: Single    Spouse name: Not on file   Number of children: Not on file   Years of education: Not on file   Highest education level: Not on file  Occupational History   Not on file  Tobacco Use   Smoking status: Never   Smokeless tobacco: Never  Vaping Use   Vaping Use: Never used  Substance and Sexual Activity   Alcohol use: Yes    Alcohol/week: 1.0 standard drink of alcohol    Types: 1 Cans of beer per week   Drug use: No   Sexual activity: Yes    Birth control/protection: None  Other Topics Concern   Not on file  Social History Narrative   Not on file   Social Determinants of Health   Financial Resource Strain: Not on file  Food Insecurity: Not on file  Transportation Needs: Not on file  Physical Activity: Not on file  Stress: Not on file  Social Connections: Not on file  Intimate Partner Violence: Not on file      Levert Feinstein, M.D. Ph.D.  [facility] [address], [address] [place], [place] [address] Ph: [phone] Fax: [phone]  CC:  Mechele Claude, MD [address] [place],  [place] [address]  Mechele Claude, MD

## 2021-09-11 NOTE — Patient Instructions (Signed)
Meds ordered this encounter  Medications   celecoxib (CELEBREX) 100 MG capsule    Sig: Take 1 capsule (100 mg total) by mouth 2 (two) times daily as needed.      DULoxetine (CYMBALTA) 60 MG capsule    Sig: Take 1 capsule (60 mg total) by mouth daily.

## 2021-09-12 ENCOUNTER — Other Ambulatory Visit: Payer: Self-pay | Admitting: Neurology

## 2021-09-12 ENCOUNTER — Ambulatory Visit: Payer: 59

## 2021-09-12 DIAGNOSIS — M25612 Stiffness of left shoulder, not elsewhere classified: Secondary | ICD-10-CM

## 2021-09-12 DIAGNOSIS — M25512 Pain in left shoulder: Secondary | ICD-10-CM

## 2021-09-12 DIAGNOSIS — R29898 Other symptoms and signs involving the musculoskeletal system: Secondary | ICD-10-CM

## 2021-09-12 DIAGNOSIS — M542 Cervicalgia: Secondary | ICD-10-CM

## 2021-09-12 DIAGNOSIS — M79602 Pain in left arm: Secondary | ICD-10-CM

## 2021-09-12 NOTE — Therapy (Signed)
OUTPATIENT PHYSICAL THERAPY TREATMENT NOTE   Patient Name: Ryan Rojas MRN: [id number] DOB:Mar 24, 1965, 56 y.o., male Today's Date: 09/12/2021  PCP: Mechele Claude, MD  REFERRING PROVIDER: Tarry Kos, MD   PT End of Session - 09/12/21 0809     Visit Number 14    Number of Visits 20    Date for PT Re-Evaluation 10/10/21    PT Start Time 0815    Activity Tolerance Patient tolerated treatment well    Behavior During Therapy [facility] for tasks assessed/performed             Past Medical History:  Diagnosis Date   Hyperlipidemia    History reviewed. No pertinent surgical history. 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   Obstructive sleep apnea 09/11/2021   Nontraumatic complete tear of left rotator cuff 05/28/2021   Impingement syndrome of left shoulder 05/28/2021   Tendinopathy of left biceps tendon 05/28/2021   Arm paresthesia, left 01/02/2021   Chronic right shoulder pain 01/02/2021   Other fatigue 01/02/2021   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   Neuropathic pain 09/30/2020   Left arm weakness 09/17/2020   Left arm pain 09/17/2020   Neck pain on left side 09/17/2020   Cubital tunnel syndrome on left 08/16/2020   Intractable neuropathic pain of lumbosacral origin 05/20/2020   Combined arterial insufficiency and corporo-venous occlusive erectile dysfunction 09/27/2019   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   Impotence 01/18/2018   Hyperlipidemia 07/16/2017    REFERRING DIAG: Nontraumatic complete tear of left rotator cuff  THERAPY DIAG:  Acute pain of left shoulder  Stiffness of left shoulder, not elsewhere classified  Rationale for Evaluation and Treatment Rehabilitation  PERTINENT HISTORY: cannot grip with 4th and 5th fingers of the left hand  PRECAUTIONS: Left rotator cuff repair   SUBJECTIVE: Patient reports that he was given a new pain medication because both  of his shoulder have been hurting at night. He feels that this new medication is helping a little.   PAIN:  Are you having pain? Yes: NPRS scale: 2/10 Pain location: left shoulder  Pain description: aching      TODAY'S TREATMENT:                                    6/30 EXERCISE LOG  Exercise Repetitions and Resistance Comments  UBE 90 RPM x 10 minutes    Pulleys (flexion)  5 minutes    Resisted pull down  Blue XTS; BUE; 2 minutes    Resisted Adduction  LUE; Blue XTS; 2 minutes    Ranger  Sitting (circles) progressing to standing  Increased discomfort and difficulty with CCW circles standing   Protraction  LUE; red t-band; 30 reps    Resisted ABD LUE; red t-band; 30 reps    AA IR With strap behind back; 3 minutes    IR stretch  LUE; behind back; 15 second hold    Wall slides  BUE; flexion; 2 minutes     Blank cell = exercise not performed today      PT Short Term Goals - 08/27/21 1104       PT SHORT TERM GOAL #1   Title Patient will be able to achieve at least 120 degrees of passive left shoulder flexion for improved shoulder moiblity.    Time 6    Period Weeks  Status On-going    Target Date 08/29/21      PT SHORT TERM GOAL #2   Title Patient will be able to achieve at least 120 degrees of passive left shoulder abduction for improved shoulder moiblity.    Time 6    Period Weeks    Status On-going    Target Date 08/29/21              PT Long Term Goals - 08/27/21 1104       PT LONG TERM GOAL #1   Title Patient will be independent with his HEP.    Time 10    Period Weeks    Status On-going    Target Date 09/26/21      PT LONG TERM GOAL #2   Title Patient will be able to demonstrate at least 120 degrees of active left shoulder flexion for improved function with overhead activities.    Time 10    Period Weeks    Status On-going    Target Date 09/26/21      PT LONG TERM GOAL #3   Title Patient will be able to demonstrate at least 120 degrees of  active left shoulder abduction for improved function reaching overhead.    Time 10    Period Weeks    Status On-going    Target Date 09/26/21      PT LONG TERM GOAL #4   Title Patient will be able to lift at least 10 pounds overhead for improved function with household activities.    Time 10    Period Weeks    Status On-going    Target Date 09/26/01              Plan - 09/12/21 0820     Clinical Impression Statement Treatment focused on improved left shoulder mobility through the use of new and familiar interventions with moderate difficulty and fatigue. He required minimal cueing with today's new interventions proper exercise performance to facilitate proper rotator cuff engagement. He exhibited increased left shoulder fatigue with today's new activities such as wall slides and resisted abduction as he began to exhibit reduced trunk stability in an attempt to compensate. He reported that his shoulder felt alright upon the conclusion of treatment. He continues to require skilled physical therapy to address his remaining impairments to maximize his functional mobility.    Personal Factors and Comorbidities Other;Profession    Examination-Activity Limitations Reach Overhead;Bathing;Self Feeding;Carry;Sleep;Dressing;Hygiene/Grooming;Lift    Examination-Participation Restrictions Occupation;Cleaning;Community Activity;Yard Work    Stability/Clinical Decision Making Stable/Uncomplicated    Rehab Potential Good    PT Frequency 2x / week    PT Duration Other (comment)    PT Treatment/Interventions ADLs/Self Care Home Management;Cryotherapy;Electrical Stimulation;Moist Heat;Neuromuscular re-education;Therapeutic exercise;Therapeutic activities;Functional mobility training;Patient/family education;Manual techniques;Passive range of motion;Taping;Vasopneumatic Device    PT Next Visit Plan PROM, scapular retraction, pendulums, and modalities as needed    Consulted and Agree with Plan of Care  Patient               Granville Lewis, PT 09/12/2021, 8:20 AM

## 2021-09-19 ENCOUNTER — Ambulatory Visit: Payer: 59 | Attending: Orthopaedic Surgery

## 2021-09-19 DIAGNOSIS — M25512 Pain in left shoulder: Secondary | ICD-10-CM | POA: Diagnosis not present

## 2021-09-19 DIAGNOSIS — M25612 Stiffness of left shoulder, not elsewhere classified: Secondary | ICD-10-CM | POA: Diagnosis present

## 2021-09-19 NOTE — Therapy (Signed)
OUTPATIENT PHYSICAL THERAPY TREATMENT NOTE   Patient Name: Ryan Rojas MRN: [id number] DOB:1966/01/20, 56 y.o., male Today's Date: 09/19/2021  PCP: Mechele Claude, MD  REFERRING PROVIDER: Tarry Kos, MD   PT End of Session - 09/19/21 0818     Visit Number 15    Number of Visits 20    Date for PT Re-Evaluation 10/10/21    PT Start Time 0815    PT Stop Time 0900    PT Time Calculation (min) 45 min    Activity Tolerance Patient tolerated treatment well    Behavior During Therapy [facility] for tasks assessed/performed              Past Medical History:  Diagnosis Date   Hyperlipidemia    History reviewed. No pertinent surgical history. 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   Obstructive sleep apnea 09/11/2021   Nontraumatic complete tear of left rotator cuff 05/28/2021   Impingement syndrome of left shoulder 05/28/2021   Tendinopathy of left biceps tendon 05/28/2021   Arm paresthesia, left 01/02/2021   Chronic right shoulder pain 01/02/2021   Other fatigue 01/02/2021   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   Neuropathic pain 09/30/2020   Left arm weakness 09/17/2020   Left arm pain 09/17/2020   Neck pain on left side 09/17/2020   Cubital tunnel syndrome on left 08/16/2020   Intractable neuropathic pain of lumbosacral origin 05/20/2020   Combined arterial insufficiency and corporo-venous occlusive erectile dysfunction 09/27/2019   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   Impotence 01/18/2018   Hyperlipidemia 07/16/2017    REFERRING DIAG: Nontraumatic complete tear of left rotator cuff  THERAPY DIAG:  Acute pain of left shoulder  Stiffness of left shoulder, not elsewhere classified  Rationale for Evaluation and Treatment Rehabilitation  PERTINENT HISTORY: cannot grip with 4th and 5th fingers of the left hand  PRECAUTIONS: Left rotator cuff repair   SUBJECTIVE: Patient  reports that his shoulder still hurts a little this morning.   PAIN:  Are you having pain? Yes: NPRS scale: 2/10 Pain location: left shoulder  Pain description: aching      TODAY'S TREATMENT:                                    7/7 EXERCISE LOG  Exercise Repetitions and Resistance Comments  UBE  120 RPM x 10 minutes    Diagonals  LUE; Blue XTS x 2 minutes    Standing cane flexion At mirror; 2 minutes To avoid altered movement patterns   Side lying ER  2 pound ankle weight around wrist (to avoid gripping with left hand) x 30 reps    Side lying abduction 2 minutes    Supine shoulder flexion 30 reps   Bicep curls LUE; Blue t-band x 3 minutes    IR (behind back)  LUE; 3 minutes     Blank cell = exercise not performed today                                         6/30 EXERCISE LOG  Exercise Repetitions and Resistance Comments  UBE 90 RPM x 10 minutes    Pulleys (flexion)  5 minutes    Resisted pull down  Blue XTS; BUE; 2  minutes    Resisted Adduction  LUE; Blue XTS; 2 minutes    Ranger  Sitting (circles) progressing to standing  Increased discomfort and difficulty with CCW circles standing   Protraction  LUE; red t-band; 30 reps    Resisted ABD LUE; red t-band; 30 reps    AA IR With strap behind back; 3 minutes    IR stretch  LUE; behind back; 15 second hold    Wall slides  BUE; flexion; 2 minutes     Blank cell = exercise not performed today      PT Short Term Goals - 08/27/21 1104       PT SHORT TERM GOAL #1   Title Patient will be able to achieve at least 120 degrees of passive left shoulder flexion for improved shoulder moiblity.    Time 6    Period Weeks    Status On-going    Target Date 08/29/21      PT SHORT TERM GOAL #2   Title Patient will be able to achieve at least 120 degrees of passive left shoulder abduction for improved shoulder moiblity.    Time 6    Period Weeks    Status On-going    Target Date 08/29/21              PT Long Term Goals -  08/27/21 1104       PT LONG TERM GOAL #1   Title Patient will be independent with his HEP.    Time 10    Period Weeks    Status On-going    Target Date 09/26/21      PT LONG TERM GOAL #2   Title Patient will be able to demonstrate at least 120 degrees of active left shoulder flexion for improved function with overhead activities.    Time 10    Period Weeks    Status On-going    Target Date 09/26/21      PT LONG TERM GOAL #3   Title Patient will be able to demonstrate at least 120 degrees of active left shoulder abduction for improved function reaching overhead.    Time 10    Period Weeks    Status On-going    Target Date 09/26/21      PT LONG TERM GOAL #4   Title Patient will be able to lift at least 10 pounds overhead for improved function with household activities.    Time 10    Period Weeks    Status On-going    Target Date 09/26/01              Plan - 09/12/21 0820     Clinical Impression Statement Patient was progressed with multiple new interventions for improved shoulder mobility. He required moderate cueing with standing cane flexion at the mirror to prevent scapular elevation. He experienced a moderate increase in fatigue with this intervention as evidenced by his decreased AROM as he continued with this activity. He experienced no significant increase in pain or discomfort with any of today's interventions. He reported that his shoulder was "tired and a little sore" upon the conclusion of treatment. He continues to require skilled physical therapy to address his remaining impairments to return to his prior level of function.    Personal Factors and Comorbidities Other;Profession    Examination-Activity Limitations Reach Overhead;Bathing;Self Feeding;Carry;Sleep;Dressing;Hygiene/Grooming;Lift    Examination-Participation Restrictions Occupation;Cleaning;Community Activity;Yard Work    Stability/Clinical Decision Making Stable/Uncomplicated    Rehab Potential Good     PT  Frequency 2x / week    PT Duration Other (comment)    PT Treatment/Interventions ADLs/Self Care Home Management;Cryotherapy;Electrical Stimulation;Moist Heat;Neuromuscular re-education;Therapeutic exercise;Therapeutic activities;Functional mobility training;Patient/family education;Manual techniques;Passive range of motion;Taping;Vasopneumatic Device    PT Next Visit Plan PROM, scapular retraction, pendulums, and modalities as needed    Consulted and Agree with Plan of Care Patient               Granville Lewis, PT 09/19/2021, 9:09 AM

## 2021-09-23 ENCOUNTER — Encounter: Payer: Self-pay | Admitting: Orthopaedic Surgery

## 2021-09-23 ENCOUNTER — Encounter: Payer: Self-pay | Admitting: *Deleted

## 2021-09-23 ENCOUNTER — Ambulatory Visit: Payer: 59 | Admitting: *Deleted

## 2021-09-23 DIAGNOSIS — M25612 Stiffness of left shoulder, not elsewhere classified: Secondary | ICD-10-CM

## 2021-09-23 DIAGNOSIS — M25512 Pain in left shoulder: Secondary | ICD-10-CM

## 2021-09-23 NOTE — Therapy (Signed)
OUTPATIENT PHYSICAL THERAPY TREATMENT NOTE   Patient Name: Ryan Rojas MRN: [id number] DOB:03-19-1965, 56 y.o., male Today's Date: 09/23/2021  PCP: Mechele Claude, MD  REFERRING PROVIDER: Tarry Kos, MD   PT End of Session - 09/23/21 3295     Visit Number 16    Number of Visits 20    Date for PT Re-Evaluation 10/10/21    PT Start Time 0815    PT Stop Time 0901    PT Time Calculation (min) 46 min              Past Medical History:  Diagnosis Date   Hyperlipidemia    History reviewed. No pertinent surgical history. 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   Obstructive sleep apnea 09/11/2021   Nontraumatic complete tear of left rotator cuff 05/28/2021   Impingement syndrome of left shoulder 05/28/2021   Tendinopathy of left biceps tendon 05/28/2021   Arm paresthesia, left 01/02/2021   Chronic right shoulder pain 01/02/2021   Other fatigue 01/02/2021   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   Neuropathic pain 09/30/2020   Left arm weakness 09/17/2020   Left arm pain 09/17/2020   Neck pain on left side 09/17/2020   Cubital tunnel syndrome on left 08/16/2020   Intractable neuropathic pain of lumbosacral origin 05/20/2020   Combined arterial insufficiency and corporo-venous occlusive erectile dysfunction 09/27/2019   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   Impotence 01/18/2018   Hyperlipidemia 07/16/2017    REFERRING DIAG: Nontraumatic complete tear of left rotator cuff  THERAPY DIAG:  Acute pain of left shoulder  Stiffness of left shoulder, not elsewhere classified  Rationale for Evaluation and Treatment Rehabilitation  PERTINENT HISTORY: cannot grip with 4th and 5th fingers of the left hand  PRECAUTIONS: Left rotator cuff repair   SUBJECTIVE: Patient reports that his shoulder still hurts a little this morning, but doing better  PAIN:  Are you having pain? Yes:  NPRS scale: 2/10 Pain location: left shoulder  Pain description: aching      TODAY'S TREATMENT:                                    09/23/21 EXERCISE LOG  Exercise Repetitions and Resistance Comments  UBE  120 RPM x 10 minutes    Diagonals     Standing cane flexion  To avoid altered movement patterns   Side lying ER  2 pound ankle weight around wrist (to avoid gripping with left hand) x 30 reps    Side lying abduction    Supine shoulder flexion 30 reps Focus on control  Supine circles CW/CCW 3x10 eachway at 100 degrees elevation Focus on control  Bicep curls    Pulleys  X 5 mins   IR (behind back)      Blank cell = exercise not performed today    Manual Therapy Passive ROM: PROM to LT shldr for ER and Elevation (ER to 55 degrees, Elev. 130 degrees. F/B rhythmic stab. At 100, 110 degrees ,                                         6/30 EXERCISE LOG  Exercise Repetitions and Resistance Comments  UBE 90 RPM x 10 minutes    Pulleys (  flexion)  5 minutes    Resisted pull down  Blue XTS; BUE; 2 minutes    Resisted Adduction  LUE; Blue XTS; 2 minutes    Ranger  Sitting (circles) progressing to standing  Increased discomfort and difficulty with CCW circles standing   Protraction  LUE; red t-band; 30 reps    Resisted ABD LUE; red t-band; 30 reps    AA IR With strap behind back; 3 minutes    IR stretch  LUE; behind back; 15 second hold    Wall slides  BUE; flexion; 2 minutes     Blank cell = exercise not performed today      PT Short Term Goals - 08/27/21 1104       PT SHORT TERM GOAL #1   Title Patient will be able to achieve at least 120 degrees of passive left shoulder flexion for improved shoulder moiblity.    Time 6    Period Weeks    Status On-going    Target Date 08/29/21      PT SHORT TERM GOAL #2   Title Patient will be able to achieve at least 120 degrees of passive left shoulder abduction for improved shoulder moiblity.    Time 6    Period Weeks    Status On-going     Target Date 08/29/21              PT Long Term Goals - 08/27/21 1104       PT LONG TERM GOAL #1   Title Patient will be independent with his HEP.    Time 10    Period Weeks    Status On-going    Target Date 09/26/21      PT LONG TERM GOAL #2   Title Patient will be able to demonstrate at least 120 degrees of active left shoulder flexion for improved function with overhead activities.    Time 10    Period Weeks    Status On-going    Target Date 09/26/21      PT LONG TERM GOAL #3   Title Patient will be able to demonstrate at least 120 degrees of active left shoulder abduction for improved function reaching overhead.    Time 10    Period Weeks    Status On-going    Target Date 09/26/21      PT LONG TERM GOAL #4   Title Patient will be able to lift at least 10 pounds overhead for improved function with household activities.    Time 10    Period Weeks    Status On-going    Target Date 09/26/01              Plan - 09/23/21 0820     Clinical Impression Statement Patient was progressed with multiple new interventions for improved shoulder mobility. Pt isn't sure of next MD appt. Encouraged Pt to work on supine and inclined elevation at home due to still needs assistance with standing elevation.   Personal Factors and Comorbidities Other;Profession    Examination-Activity Limitations Reach Overhead;Bathing;Self Feeding;Carry;Sleep;Dressing;Hygiene/Grooming;Lift    Examination-Participation Restrictions Occupation;Cleaning;Community Activity;Yard Work    Stability/Clinical Decision Making Stable/Uncomplicated    Rehab Potential Good    PT Frequency 2x / week    PT Duration Other (comment)    PT Treatment/Interventions ADLs/Self Care Home Management;Cryotherapy;Electrical Stimulation;Moist Heat;Neuromuscular re-education;Therapeutic exercise;Therapeutic activities;Functional mobility training;Patient/family education;Manual techniques;Passive range of  motion;Taping;Vasopneumatic Device    PT Next Visit Plan PROM, scapular retraction, pendulums, and modalities as  needed   post-op11 weeks 09-25-21   Consulted and Agree with Plan of Care Patient               Callaway Hailes,CHRIS, PTA 09/23/2021, 9:14 AM

## 2021-09-25 ENCOUNTER — Telehealth: Payer: Self-pay | Admitting: Orthopaedic Surgery

## 2021-09-25 NOTE — Telephone Encounter (Signed)
[employer] forms received. To [employer]. 

## 2021-09-26 ENCOUNTER — Ambulatory Visit: Payer: 59 | Admitting: *Deleted

## 2021-09-26 ENCOUNTER — Encounter: Payer: Self-pay | Admitting: *Deleted

## 2021-09-26 DIAGNOSIS — M25512 Pain in left shoulder: Secondary | ICD-10-CM

## 2021-09-26 DIAGNOSIS — M25612 Stiffness of left shoulder, not elsewhere classified: Secondary | ICD-10-CM

## 2021-09-26 NOTE — Therapy (Signed)
OUTPATIENT PHYSICAL THERAPY TREATMENT NOTE   Patient Name: Ryan Rojas MRN: [id number] DOB:[birth year], 56 y.o., male Today's Date: 09/26/2021  PCP: Mechele Claude, MD  REFERRING PROVIDER: Tarry Kos, MD   PT End of Session - 09/26/21 9562     Visit Number 17    Number of Visits 20    Date for PT Re-Evaluation 10/10/21    PT Start Time 0815    PT Stop Time 0901    PT Time Calculation (min) 46 min              Past Medical History:  Diagnosis Date   Hyperlipidemia    History reviewed. No pertinent surgical history. 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   Obstructive sleep apnea 09/11/2021   Nontraumatic complete tear of left rotator cuff 05/28/2021   Impingement syndrome of left shoulder 05/28/2021   Tendinopathy of left biceps tendon 05/28/2021   Arm paresthesia, left 01/02/2021   Chronic right shoulder pain 01/02/2021   Other fatigue 01/02/2021   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   Neuropathic pain 09/30/2020   Left arm weakness 09/17/2020   Left arm pain 09/17/2020   Neck pain on left side 09/17/2020   Cubital tunnel syndrome on left 08/16/2020   Intractable neuropathic pain of lumbosacral origin 05/20/2020   Combined arterial insufficiency and corporo-venous occlusive erectile dysfunction 09/27/2019   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   Impotence 01/18/2018   Hyperlipidemia 07/16/2017    REFERRING DIAG: Nontraumatic complete tear of left rotator cuff  THERAPY DIAG:  Acute pain of left shoulder  Stiffness of left shoulder, not elsewhere classified  Rationale for Evaluation and Treatment Rehabilitation  PERTINENT HISTORY: cannot grip with 4th and 5th fingers of the left hand  PRECAUTIONS: Left rotator cuff repair   SUBJECTIVE: Patient reports that his shoulder still hurts a little this morning. Did okay after last RX. To MD Tuesday  PAIN:  Are you  having pain? Yes: NPRS scale: 2-3/10 Pain location: left shoulder  Pain description: aching      TODAY'S TREATMENT:                                    09/26/21 EXERCISE LOG  Exercise Repetitions and Resistance Comments  UBE  120 RPM x 10 minutes    Diagonals     Standing cane flexion  To avoid altered movement patterns   Side lying ER  2 pound ankle weight around wrist (to avoid gripping with left hand) x 30 reps    Side lying abduction    Supine shoulder  punches and flexion 2x10 reps each Focus on control  Supine circles CW/CCW 3x10 eachway at 100 degrees elevation Focus on control  Bicep curls    Pulleys  X   IR (behind back)      Blank cell = exercise not performed today    Manual Therapy Passive ROM: PROM to LT shldr for ER and Elevation (ER to 65 degrees, Elev. 132 degrees. F/B rhythmic stab. At 100, 110 degrees ,                                         6/30 EXERCISE LOG  Exercise Repetitions and Resistance Comments  UBE 90  RPM x 10 minutes    Pulleys (flexion)  5 minutes    Resisted pull down  Blue XTS; BUE; 2 minutes    Resisted Adduction  LUE; Blue XTS; 2 minutes    Ranger  Sitting (circles) progressing to standing  Increased discomfort and difficulty with CCW circles standing   Protraction  LUE; red t-band; 30 reps    Resisted ABD LUE; red t-band; 30 reps    AA IR With strap behind back; 3 minutes    IR stretch  LUE; behind back; 15 second hold    Wall slides  BUE; flexion; 2 minutes     Blank cell = exercise not performed today   Progress Note Reporting Period 07/18/21 to 09/26/21  See note below for Objective Data and Assessment of Progress/Goals.   Patient is making fair progress with skilled physical therapy. However, he continues to exhibit AROM limitations due to increased shoulder discomfort and stiffness. His functional use of his left shoulder remains limited, but this can partially be attributed to his limited use of his left hand. Recommend that he  continue with skilled physical therapy to address his remaining impairments to maximize his functional mobility.   Candi Leash, PT, DPT    PT Short Term Goals - 08/27/21 1104       PT SHORT TERM GOAL #1   Title Patient will be able to achieve at least 120 degrees of passive left shoulder flexion for improved shoulder moiblity.    Time 6    Period Weeks    Status Achieved   Target Date 08/29/21      PT SHORT TERM GOAL #2   Title Patient will be able to achieve at least 120 degrees of passive left shoulder abduction for improved shoulder moiblity.    Time 6    Period Weeks    Status On-going    Target Date 08/29/21              PT Long Term Goals - 08/27/21 1104       PT LONG TERM GOAL #1   Title Patient will be independent with his HEP.    Time 10    Period Weeks    Status On-going    Target Date 09/26/21      PT LONG TERM GOAL #2   Title Patient will be able to demonstrate at least 120 degrees of active left shoulder flexion for improved function with overhead activities.    Time 10    Period Weeks    Status On-going    Target Date 09/26/21      PT LONG TERM GOAL #3   Title Patient will be able to demonstrate at least 120 degrees of active left shoulder abduction for improved function reaching overhead.    Time 10    Period Weeks    Status On-going    Target Date 09/26/21      PT LONG TERM GOAL #4   Title Patient will be able to lift at least 10 pounds overhead for improved function with household activities.    Time 10    Period Weeks    Status On-going    Target Date 09/26/01              Plan - 09/26/21 0820     Clinical Impression Statement Pt arrived today doing fairly well with LT shldr , but still with problems raising LT UE. Rx focused on AAROM, AROM a swell as light strengthening.PROM supine  Flexion to 132 degrees and ER 65 degrees. ROM continues to improve. Pt able to perform flexion supine, but still not in standing. To MD Tuesday.  Try  side lying elevation next Rx   Personal Factors and Comorbidities Other;Profession    Examination-Activity Limitations Reach Overhead;Bathing;Self Feeding;Carry;Sleep;Dressing;Hygiene/Grooming;Lift    Examination-Participation Restrictions Occupation;Cleaning;Community Activity;Yard Work    Stability/Clinical Decision Making Stable/Uncomplicated    Rehab Potential Good    PT Frequency 2x / week    PT Duration Other (comment)    PT Treatment/Interventions ADLs/Self Care Home Management;Cryotherapy;Electrical Stimulation;Moist Heat;Neuromuscular re-education;Therapeutic exercise;Therapeutic activities;Functional mobility training;Patient/family education;Manual techniques;Passive range of motion;Taping;Vasopneumatic Device    PT Next Visit Plan PROM, scapular retraction, pendulums, and modalities as needed   post-op11 weeks 09-25-21   Consulted and Agree with Plan of Care Patient               Mikaylah Libbey,CHRIS, PTA 09/26/2021, 9:10 AM

## 2021-09-30 ENCOUNTER — Telehealth: Payer: Self-pay | Admitting: Orthopaedic Surgery

## 2021-09-30 ENCOUNTER — Ambulatory Visit (INDEPENDENT_AMBULATORY_CARE_PROVIDER_SITE_OTHER): Payer: 59 | Admitting: Physician Assistant

## 2021-09-30 DIAGNOSIS — Z9889 Other specified postprocedural states: Secondary | ICD-10-CM

## 2021-09-30 NOTE — Progress Notes (Signed)
Post-Op Visit Note   Patient: Ryan Rojas           Date of Birth: Apr 19, 1965           MRN: [id number] Visit Date: 09/30/2021 PCP: Mechele Claude, MD   Assessment & Plan:  Chief Complaint:  Chief Complaint  Patient presents with   Left Shoulder - Follow-up   Visit Diagnoses:  1. S/P left rotator cuff repair     Plan: Patient is a 56 year old gentleman who comes in today approximately 12 weeks status post left shoulder arthroscopic debridement rotator cuff repair 07/03/2021.  He has been doing okay.  He still complains of only pain at night.  He has been in physical therapy twice a week and continues to work on home exercise program.  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  Examination of his left shoulder reveals approximately 50% active range of motion.  I can get him to approximately 160 degrees of forward flexion passively.  Does have 4 out of 5 strength with resisted internal and external rotation.  He is neurovascular intact distally.  At this point, he needs to continue working on formal physical therapy as well as a home exercise program.  He has expressed interest in returning back to work but notes there is not a desk work option.  I do not feel comfortable letting him go back at this point would like to keep him out for another 4 weeks.  Follow-up with [place] at that time for recheck.  Call with concerns or questions.  Follow-Up Instructions: Return in about 4 weeks (around 10/28/2021).   Orders:  No orders of the defined types were placed in this encounter.  No orders of the defined types were placed in this encounter.   Imaging: No new imaging  PMFS History: 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   Obstructive sleep apnea 09/11/2021   Nontraumatic complete tear  of left rotator cuff 05/28/2021   Impingement syndrome of left shoulder 05/28/2021   Tendinopathy of left biceps tendon 05/28/2021   Arm paresthesia, left 01/02/2021   Chronic right shoulder pain 01/02/2021   Other fatigue 01/02/2021   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   Neuropathic pain 09/30/2020   Left arm weakness 09/17/2020   Left arm pain 09/17/2020   Neck pain on left side 09/17/2020   Cubital tunnel syndrome on left 08/16/2020   Intractable neuropathic pain of lumbosacral origin 05/20/2020   Combined arterial insufficiency and corporo-venous occlusive erectile dysfunction 09/27/2019   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   Impotence 01/18/2018   Hyperlipidemia 07/16/2017   Past Medical History:  Diagnosis Date   Hyperlipidemia     Family History  Problem Relation Age of Onset   Asthma Mother    Diabetes Sister    Asthma Sister     No past surgical history on file. Social History   Occupational History   Not on file  Tobacco Use   Smoking status: Never   Smokeless tobacco: Never  Vaping Use   Vaping Use: Never used  Substance and Sexual Activity   Alcohol use: Yes    Alcohol/week: 1.0 standard drink of alcohol    Types: 1 Cans of beer per week   Drug use: No   Sexual activity: Yes  Birth control/protection: None

## 2021-09-30 NOTE — Telephone Encounter (Signed)
Received $25.00 and a medical records release form/ Forwarding to [employer] today

## 2021-09-30 NOTE — Addendum Note (Signed)
Addended by: Wendi Maya on: 09/30/2021 10:54 AM   Modules accepted: Orders

## 2021-10-02 ENCOUNTER — Encounter: Payer: Self-pay | Admitting: *Deleted

## 2021-10-02 ENCOUNTER — Ambulatory Visit: Payer: 59 | Admitting: *Deleted

## 2021-10-02 DIAGNOSIS — M25612 Stiffness of left shoulder, not elsewhere classified: Secondary | ICD-10-CM

## 2021-10-02 DIAGNOSIS — M25512 Pain in left shoulder: Secondary | ICD-10-CM | POA: Diagnosis not present

## 2021-10-02 NOTE — Therapy (Signed)
OUTPATIENT PHYSICAL THERAPY TREATMENT NOTE   Patient Name: Ryan Rojas MRN: [id number] DOB:1965-03-19, 56 y.o., male Today's Date: 10/02/2021  PCP: Claretta Fraise, MD  REFERRING PROVIDER: Leandrew Koyanagi, MD   PT End of Session - 10/02/21 0826     Visit Number 18    Number of Visits 20    Date for PT Re-Evaluation 10/10/21    PT Start Time 0815    PT Stop Time 0902    PT Time Calculation (min) 47 min              Past Medical History:  Diagnosis Date   Hyperlipidemia    History reviewed. No pertinent surgical history. 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   Obstructive sleep apnea 09/11/2021   Nontraumatic complete tear of left rotator cuff 05/28/2021   Impingement syndrome of left shoulder 05/28/2021   Tendinopathy of left biceps tendon 05/28/2021   Arm paresthesia, left 01/02/2021   Chronic right shoulder pain 01/02/2021   Other fatigue 01/02/2021   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   Neuropathic pain 09/30/2020   Left arm weakness 09/17/2020   Left arm pain 09/17/2020   Neck pain on left side 09/17/2020   Cubital tunnel syndrome on left 08/16/2020   Intractable neuropathic pain of lumbosacral origin 05/20/2020   Combined arterial insufficiency and corporo-venous occlusive erectile dysfunction 09/27/2019   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   Impotence 01/18/2018   Hyperlipidemia 07/16/2017    REFERRING DIAG: Nontraumatic complete tear of left rotator cuff  THERAPY DIAG:  Acute pain of left shoulder  Stiffness of left shoulder, not elsewhere classified  Rationale for Evaluation and Treatment Rehabilitation  PERTINENT HISTORY: cannot grip with 4th and 5th fingers of the left hand  PRECAUTIONS: Left rotator cuff repair   SUBJECTIVE: Patient reports that his shoulder still hurts a little this morning. Did okay after last RX. To MD Tuesday  PAIN:  Are you  having pain? Yes: NPRS scale: 2-3/10 Pain location: left shoulder  Pain description: aching      TODAY'S TREATMENT:                                    10/02/21 EXERCISE LOG   LT shldr  Exercise Repetitions and Resistance Comments  UBE  60 RPM x 10 minutes    Standing wall slides for elevation 2x10   Standing Tband LT shldr Green:  punches,IR, Rows,ext 2x15-20 To avoid altered movement patterns   Tband ER Red:  2x10-15   Side lying ER  2 pound ankle weight around wrist (to avoid gripping with left hand) x 30 reps    Side lying abduction    AROM 3x10 Added to HEP  Side lying flexion 3x10    AROM 3x10   Supine shoulder  punches and flexion  Focus on control  Supine lat stretch 2# 90 degrees flexion to 110 degrees Focus on control  Bicep curls    Pulleys  X 59mins   IR (behind back)      Blank cell = exercise not performed today    Manual Therapy Passive ROM:  ,  6/30 EXERCISE LOG  Exercise Repetitions and Resistance Comments  UBE 90 RPM x 10 minutes    Pulleys (flexion)  5 minutes    Resisted pull down  Blue XTS; BUE; 2 minutes    Resisted Adduction  LUE; Blue XTS; 2 minutes    Ranger  Sitting (circles) progressing to standing  Increased discomfort and difficulty with CCW circles standing   Protraction  LUE; red t-band; 30 reps    Resisted ABD LUE; red t-band; 30 reps    AA IR With strap behind back; 3 minutes    IR stretch  LUE; behind back; 15 second hold    Wall slides  BUE; flexion; 2 minutes     Blank cell = exercise not performed today     PT Short Term Goals - 08/27/21 1104       PT SHORT TERM GOAL #1   Title Patient will be able to achieve at least 120 degrees of passive left shoulder flexion for improved shoulder moiblity.    Time 6    Period Weeks    Status Achieved   Target Date 08/29/21      PT SHORT TERM GOAL #2   Title Patient will be able to achieve at least 120 degrees of passive left shoulder abduction for  improved shoulder moiblity.    Time 6    Period Weeks    Status On-going    Target Date 08/29/21              PT Long Term Goals - 08/27/21 1104       PT LONG TERM GOAL #1   Title Patient will be independent with his HEP.    Time 10    Period Weeks    Status On-going    Target Date 09/26/21      PT LONG TERM GOAL #2   Title Patient will be able to demonstrate at least 120 degrees of active left shoulder flexion for improved function with overhead activities.    Time 10    Period Weeks    Status On-going    Target Date 09/26/21      PT LONG TERM GOAL #3   Title Patient will be able to demonstrate at least 120 degrees of active left shoulder abduction for improved function reaching overhead.    Time 10    Period Weeks    Status On-going    Target Date 09/26/21      PT LONG TERM GOAL #4   Title Patient will be able to lift at least 10 pounds overhead for improved function with household activities.    Time 10    Period Weeks    Status On-going    Target Date 09/26/01              Plan - 10/02/21 0820     Clinical Impression Statement Pt reports f/u with MD went well and is OOW until 10-28-21. Rx focused on close tband strengthening with RW exs and AROM and AAROM for elevation due to shldr hiking compensatory motions. Sidelying elevation performed today and given for HEP.   Personal Factors and Comorbidities Other;Profession    Examination-Activity Limitations Reach Overhead;Bathing;Self Feeding;Carry;Sleep;Dressing;Hygiene/Grooming;Lift    Examination-Participation Restrictions Occupation;Cleaning;Community Activity;Yard Work    Stability/Clinical Decision Making Stable/Uncomplicated    Rehab Potential Good    PT Frequency 2x / week    PT Duration Other (comment)    PT Treatment/Interventions ADLs/Self Care Home Management;Cryotherapy;Electrical Stimulation;Moist Heat;Neuromuscular re-education;Therapeutic exercise;Therapeutic activities;Functional mobility  training;Patient/family education;Manual techniques;Passive range of motion;Taping;Vasopneumatic Device    PT Next Visit Plan PROM, scapular retraction, pendulums, and modalities as needed   post-op11 weeks 09-25-21   Consulted and Agree with Plan of Care Patient               Johnathen Testa,CHRIS, PTA 10/02/2021, 9:12 AM

## 2021-10-03 ENCOUNTER — Other Ambulatory Visit: Payer: Self-pay | Admitting: Neurology

## 2021-10-05 ENCOUNTER — Encounter: Payer: Self-pay | Admitting: Family Medicine

## 2021-10-06 ENCOUNTER — Ambulatory Visit (INDEPENDENT_AMBULATORY_CARE_PROVIDER_SITE_OTHER): Payer: 59 | Admitting: Orthopaedic Surgery

## 2021-10-06 DIAGNOSIS — M25511 Pain in right shoulder: Secondary | ICD-10-CM | POA: Diagnosis not present

## 2021-10-06 DIAGNOSIS — M75111 Incomplete rotator cuff tear or rupture of right shoulder, not specified as traumatic: Secondary | ICD-10-CM

## 2021-10-06 MED ORDER — LIDOCAINE HCL 1 % IJ SOLN
4.0000 mL | INTRAMUSCULAR | Status: AC | PRN
  Administered 2021-10-06: 4 mL

## 2021-10-06 MED ORDER — TRIAMCINOLONE ACETONIDE 40 MG/ML IJ SUSP
80.0000 mg | INTRAMUSCULAR | Status: AC | PRN
  Administered 2021-10-06: 80 mg via INTRA_ARTICULAR

## 2021-10-06 NOTE — Progress Notes (Signed)
Chief Complaint: Right shoulder injection     History of Present Illness:    Ryan Rojas is a 56 y.o. male right-hand-dominant male presents with right shoulder pain in the setting of right shoulder tearing of the rotator cuff.  He has been previously seen by Dr. Roda Shutters.  He is 3 months out from a contralateral left rotator cuff repair.  He is here today for right ultrasound-guided shoulder injection hopefully get him some relief and allow him to continue to rehab the contralateral side.    Surgical History:   Left rotator cuff repair 3 months prior with Dr. Roda Shutters  PMH/PSH/Family History/Social History/Meds/Allergies:    Past Medical History:  Diagnosis Date  . Hyperlipidemia    No past surgical history on file. Social History   Socioeconomic History  . Marital status: Single    Spouse name: Not on file  . Number of children: Not on file  . Years of education: Not on file  . Highest education level: Not on file  Occupational History  . Not on file  Tobacco Use  . Smoking status: Never  . Smokeless tobacco: Never  Vaping Use  . Vaping Use: Never used  Substance and Sexual Activity  . Alcohol use: Yes    Alcohol/week: 1.0 standard drink of alcohol    Types: 1 Cans of beer per week  . Drug use: No  . Sexual activity: Yes    Birth control/protection: None  Other Topics Concern  . Not on file  Social History Narrative  . Not on file   Social Determinants of Health   Financial Resource Strain: Not on file  Food Insecurity: Not on file  Transportation Needs: Not on file  Physical Activity: Not on file  Stress: Not on file  Social Connections: Not on file   Family History  Problem Relation Age of Onset  . Asthma Mother   . Diabetes Sister   . Asthma Sister    Allergies  Allergen Reactions  . Hydrocodone Itching  . Oxycodone Hives   Current Outpatient Medications  Medication Sig Dispense Refill  . atorvastatin (LIPITOR) 40 MG  tablet Take 1 tablet (40 mg total) by mouth daily. 90 tablet 3  . celecoxib (CELEBREX) 100 MG capsule Take 1 capsule (100 mg total) by mouth 2 (two) times daily as needed. 60 capsule 3  . diphenhydrAMINE HCl (BENADRYL ALLERGY PO) Take by mouth as needed.    . DULoxetine (CYMBALTA) 60 MG capsule TAKE 1 CAPSULE BY MOUTH EVERY DAY 90 capsule 1  . HYDROmorphone (DILAUDID) 2 MG tablet Take 1 tablet (2 mg total) by mouth at bedtime as needed for severe pain. 20 tablet 0  . ondansetron (ZOFRAN) 4 MG tablet Take 1 tablet (4 mg total) by mouth every 8 (eight) hours as needed for nausea or vomiting. 40 tablet 0  . sildenafil (VIAGRA) 100 MG tablet TAKE 0.5-1 TABLETS BY MOUTH DAILY AS NEEDED FOR ERECTILE DYSFUNCTION. 6 tablet 7  . zolpidem (AMBIEN) 5 MG tablet Take 1 tablet (5 mg total) by mouth at bedtime as needed for sleep. 30 tablet 0   No current facility-administered medications for this visit.   No results found.  Review of Systems:   A ROS was performed including pertinent positives and negatives as documented in the HPI.  Physical Exam :  Constitutional: NAD and appears stated age Neurological: Alert and oriented Psych: Appropriate affect and cooperative There were no vitals taken for this visit.   Comprehensive Musculoskeletal Exam:    Tenderness to palpation about the anterior lateral shoulder greater tuberosity.  Forward elevation on the right side is to 160 degrees external rotation side is 30 degrees.  He is got pain and a positive Neer impingement  Imaging:   Xray (2 views right shoulder): Normal  MRI (right shoulder MRI): Partial tearing of the subscapularis as well as supraspinatus tendon as well as subscapularis tendon  I personally reviewed and interpreted the radiographs.   Assessment:   56 y.o. male right-hand-dominant male presents with right shoulder pain consistent with partial-thickness rotator cuff tear.  He is currently rehabbing his left side which was repaired  3 months prior.  Today for ultrasound-guided injection.  I explained with him the risk benefits of the procedure and he elected for this.  Right shoulder injection performed in the subacromial space over the supraspinatus as well as over the subscapularis tendon  Plan :    -Right shoulder injection performed today after verbal consent obtained    Procedure Note  Patient: Ryan Rojas             Date of Birth: 06-02-1965           MRN: [id number]             Visit Date: 10/06/2021  Procedures: Visit Diagnoses: No diagnosis found.  Large Joint Inj: R subacromial bursa on 10/06/2021 10:49 AM Indications: pain Details: 22 G 1.5 in needle, ultrasound-guided anterior approach  Arthrogram: No  Medications: 4 mL lidocaine 1 %; 80 mg triamcinolone acetonide 40 MG/ML Outcome: tolerated well, no immediate complications Procedure, treatment alternatives, risks and benefits explained, specific risks discussed. Consent was given by the patient. Immediately prior to procedure a time out was called to verify the correct patient, procedure, equipment, support staff and site/side marked as required. Patient was prepped and draped in the usual sterile fashion.        I personally saw and evaluated the patient, and participated in the management and treatment plan.  Huel Cote, MD Attending Physician, Orthopedic Surgery  This document was dictated using Dragon voice recognition software. A reasonable attempt at proof reading has been made to minimize errors.

## 2021-10-09 ENCOUNTER — Encounter: Payer: 59 | Admitting: *Deleted

## 2021-10-14 ENCOUNTER — Ambulatory Visit: Payer: 59 | Attending: Orthopaedic Surgery

## 2021-10-14 DIAGNOSIS — M25512 Pain in left shoulder: Secondary | ICD-10-CM | POA: Diagnosis present

## 2021-10-14 DIAGNOSIS — M25612 Stiffness of left shoulder, not elsewhere classified: Secondary | ICD-10-CM | POA: Insufficient documentation

## 2021-10-14 NOTE — Therapy (Signed)
OUTPATIENT PHYSICAL THERAPY TREATMENT NOTE   Patient Name: Ryan Rojas MRN: [id number] DOB:[birth year], 56 y.o., male Today's Date: 10/14/2021  PCP: Mechele Claude, MD  REFERRING PROVIDER: Tarry Kos, MD   PT End of Session - 10/14/21 0818     Visit Number 19    Number of Visits 20    Date for PT Re-Evaluation 10/10/21    PT Start Time 0815    PT Stop Time 0901    PT Time Calculation (min) 46 min              Past Medical History:  Diagnosis Date   Hyperlipidemia    History reviewed. No pertinent surgical history. 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   Obstructive sleep apnea 09/11/2021   Nontraumatic complete tear of left rotator cuff 05/28/2021   Impingement syndrome of left shoulder 05/28/2021   Tendinopathy of left biceps tendon 05/28/2021   Arm paresthesia, left 01/02/2021   Chronic right shoulder pain 01/02/2021   Other fatigue 01/02/2021   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   Neuropathic pain 09/30/2020   Left arm weakness 09/17/2020   Left arm pain 09/17/2020   Neck pain on left side 09/17/2020   Cubital tunnel syndrome on left 08/16/2020   Intractable neuropathic pain of lumbosacral origin 05/20/2020   Combined arterial insufficiency and corporo-venous occlusive erectile dysfunction 09/27/2019   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   Impotence 01/18/2018   Hyperlipidemia 07/16/2017    REFERRING DIAG: Nontraumatic complete tear of left rotator cuff  THERAPY DIAG:  Acute pain of left shoulder  Stiffness of left shoulder, not elsewhere classified  Rationale for Evaluation and Treatment Rehabilitation  PERTINENT HISTORY: cannot grip with 4th and 5th fingers of the left hand  PRECAUTIONS: Left rotator cuff repair   SUBJECTIVE: Pt reports feeling good this morning.  Had injection in right shoulder and it is feeling much better.  PAIN:  Are you  having pain? No     TODAY'S TREATMENT:                             8/1       EXERCISE LOG  Exercise Repetitions and Resistance Comments  UBE 60 RPM x 10 mins (5 mins forward/backward)   Pulleys X5 mins; flexion   Protractions Green tband x 20 reps   Rows Green tband x 20 reps   Extensions Green tband x 20 reps   External Rotation Green tband x 20 reps   Internal Rotation Green tband x 20 reps   Wall Slides Flexion x 2 mins   Ranger Standing: flexion/extension; arcs; x 2 mins each   Overhead Press 1# 2 sets of 10; right side against wall to avoid leaning   Side Lying ER 3# ankle weight on wrist x 25 reps   Side Lying Abduction 2# ankle weight on wrist x 25 reps   Side Lying Flexion 2# ankle weight on wrist x 25 reps    Blank cell = exercise not performed today   Manual Therapy Soft Tissue Mobilization: left shoulder, STW/M to left deltoid and proximal bicep tendon to decrease pain and tone                                     10/02/21 EXERCISE LOG  LT shldr  Exercise Repetitions and Resistance Comments  UBE  60 RPM x 10 minutes    Standing wall slides for elevation 2x10   Standing Tband LT shldr Green:  punches,IR, Rows,ext 2x15-20 To avoid altered movement patterns   Tband ER Red:  2x10-15   Side lying ER  2 pound ankle weight around wrist (to avoid gripping with left hand) x 30 reps    Side lying abduction    AROM 3x10 Added to HEP  Side lying flexion 3x10    AROM 3x10   Supine shoulder  punches and flexion  Focus on control  Supine lat stretch 2# 90 degrees flexion to 110 degrees Focus on control  Bicep curls    Pulleys  X   IR (behind back)      Blank cell = exercise not performed today    Manual Therapy Passive ROM:  ,                                         6/30 EXERCISE LOG  Exercise Repetitions and Resistance Comments  UBE 90 RPM x 10 minutes    Pulleys (flexion)  5 minutes    Resisted pull down  Blue XTS; BUE; 2 minutes    Resisted Adduction  LUE;  Blue XTS; 2 minutes    Ranger  Sitting (circles) progressing to standing  Increased discomfort and difficulty with CCW circles standing   Protraction  LUE; red t-band; 30 reps    Resisted ABD LUE; red t-band; 30 reps    AA IR With strap behind back; 3 minutes    IR stretch  LUE; behind back; 15 second hold    Wall slides  BUE; flexion; 2 minutes     Blank cell = exercise not performed today     PT Short Term Goals - 08/27/21 1104       PT SHORT TERM GOAL #1   Title Patient will be able to achieve at least 120 degrees of passive left shoulder flexion for improved shoulder moiblity.    Time 6    Period Weeks    Status Achieved   Target Date 08/29/21      PT SHORT TERM GOAL #2   Title Patient will be able to achieve at least 120 degrees of passive left shoulder abduction for improved shoulder moiblity.    Time 6    Period Weeks    Status On-going    Target Date 08/29/21              PT Long Term Goals - 08/27/21 1104       PT LONG TERM GOAL #1   Title Patient will be independent with his HEP.    Time 10    Period Weeks    Status On-going    Target Date 09/26/21      PT LONG TERM GOAL #2   Title Patient will be able to demonstrate at least 120 degrees of active left shoulder flexion for improved function with overhead activities.    Time 10    Period Weeks    Status On-going    Target Date 09/26/21      PT LONG TERM GOAL #3   Title Patient will be able to demonstrate at least 120 degrees of active left shoulder abduction for improved function reaching overhead.    Time 10  Period Weeks    Status On-going    Target Date 09/26/21      PT LONG TERM GOAL #4   Title Patient will be able to lift at least 10 pounds overhead for improved function with household activities.    Time 10    Period Weeks    Status On-going    Target Date 09/26/01              Plan - 10/02/21 0820     Clinical Impression Statement Pt arrives for today's treatment session  denying any pain.  Pt reports that he had an injection in his right shoulder and it is feeling much better.  Pt introduced to overhead press with 1# weight.  Pt positioned with right side against wall to decrease compensatory leaning. Pt able to tolerate increased resistance with side-lying exercises today.  STW/M performed to left deltoid and proximal bicep tendon to decrease pain and tone.  Pt denied any pain at completion of today's treatment session.  Standing overhead press added to HEP.    Personal Factors and Comorbidities Other;Profession    Examination-Activity Limitations Reach Overhead;Bathing;Self Feeding;Carry;Sleep;Dressing;Hygiene/Grooming;Lift    Examination-Participation Restrictions Occupation;Cleaning;Community Activity;Yard Work    Stability/Clinical Decision Making Stable/Uncomplicated    Rehab Potential Good    PT Frequency 2x / week    PT Duration Other (comment)    PT Treatment/Interventions ADLs/Self Care Home Management;Cryotherapy;Electrical Stimulation;Moist Heat;Neuromuscular re-education;Therapeutic exercise;Therapeutic activities;Functional mobility training;Patient/family education;Manual techniques;Passive range of motion;Taping;Vasopneumatic Device    PT Next Visit Plan PROM, scapular retraction, pendulums, and modalities as needed   post-op11 weeks 09-25-21   Consulted and Agree with Plan of Care Patient               Newman Pies, PTA 10/14/2021, 9:07 AM

## 2021-10-16 ENCOUNTER — Ambulatory Visit: Payer: 59

## 2021-10-16 DIAGNOSIS — M25612 Stiffness of left shoulder, not elsewhere classified: Secondary | ICD-10-CM

## 2021-10-16 DIAGNOSIS — M25512 Pain in left shoulder: Secondary | ICD-10-CM

## 2021-10-16 NOTE — Therapy (Addendum)
OUTPATIENT PHYSICAL THERAPY TREATMENT NOTE   Patient Name: Ryan Rojas MRN: [id number] DOB:01/22/1966, 56 y.o., male Today's Date: 10/16/2021  PCP: Stacks, Warren, MD  REFERRING PROVIDER: Xu, Naiping M, MD   PT End of Session - 10/16/21 0820     Visit Number 20    Number of Visits 20    Date for PT Re-Evaluation 10/10/21    PT Start Time 0815    PT Stop Time 0857    PT Time Calculation (min) 42 min              Past Medical History:  Diagnosis Date   Hyperlipidemia    History reviewed. No pertinent surgical history. 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   Obstructive sleep apnea 09/11/2021   Nontraumatic complete tear of left rotator cuff 05/28/2021   Impingement syndrome of left shoulder 05/28/2021   Tendinopathy of left biceps tendon 05/28/2021   Arm paresthesia, left 01/02/2021   Chronic right shoulder pain 01/02/2021   Other fatigue 01/02/2021   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   Neuropathic pain 09/30/2020   Left arm weakness 09/17/2020   Left arm pain 09/17/2020   Neck pain on left side 09/17/2020   Cubital tunnel syndrome on left 08/16/2020   Intractable neuropathic pain of lumbosacral origin 05/20/2020   Combined arterial insufficiency and corporo-venous occlusive erectile dysfunction 09/27/2019   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   Impotence 01/18/2018   Hyperlipidemia 07/16/2017    REFERRING DIAG: Nontraumatic complete tear of left rotator cuff  THERAPY DIAG:  Acute pain of left shoulder  Stiffness of left shoulder, not elsewhere classified  Rationale for Evaluation and Treatment Rehabilitation  PERTINENT HISTORY: cannot grip with 4th and 5th fingers of the left hand  PRECAUTIONS: Left rotator cuff repair   SUBJECTIVE: Pt reports feeling good this morning.  Pt denies any pain.   PAIN:  Are you having pain? No     TODAY'S TREATMENT:                              8/3      EXERCISE LOG  Exercise Repetitions and Resistance Comments  UBE 60 RPM x 10 mins (5 mins forward/backward)   Pulleys X5 mins; flexion   Wall Push Ups 25 reps   Protractions Green tband x 25 reps   Rows Green tband x 25 reps   Extensions Green tband x 25 reps   External Rotation Green tband x 25 reps   Internal Rotation Green tband x 25 reps   Wall Slides Flexion x 2 mins   Ranger Standing: flexion/extension; arcs; x 2 mins each   Overhead Press 1# 2 sets of 10; right side against wall to avoid leaning   Side Lying ER 3# ankle weight on wrist x 25 reps   Side Lying Abduction 3# ankle weight on wrist x 25 reps   Side Lying Flexion 3# ankle weight on wrist x 25 reps    Blank cell = exercise not performed today   Manual Therapy Passive ROM: left shoulder, PROM into flexion, abduction, and ER/IR with hold at end range to increase ROM                               PT Short Term Goals - 08/27/21 1104         PT SHORT TERM GOAL #1   Title Patient will be able to achieve at least 120 degrees of passive left shoulder flexion for improved shoulder moiblity.    Time 6    Period Weeks    Status Achieved   Target Date 08/29/21      PT SHORT TERM GOAL #2   Title Patient will be able to achieve at least 120 degrees of passive left shoulder abduction for improved shoulder moiblity.    Time 6    Period Weeks    Status Achieved   Target Date 08/29/21              PT Long Term Goals - 08/27/21 1104       PT LONG TERM GOAL #1   Title Patient will be independent with his HEP.    Time 10    Period Weeks    Status  Achieved   Target Date 09/26/21      PT LONG TERM GOAL #2   Title Patient will be able to demonstrate at least 120 degrees of active left shoulder flexion for improved function with overhead activities.    Time 10    Period Weeks    Status Partially Met (uses other arm to achieve)   Target Date 09/26/21      PT LONG TERM GOAL #3    Title Patient will be able to demonstrate at least 120 degrees of active left shoulder abduction for improved function reaching overhead.    Time 10    Period Weeks    Status On-going (10/16/21: 100 degrees)   Target Date 09/26/21      PT LONG TERM GOAL #4   Title Patient will be able to lift at least 10 pounds overhead for improved function with household activities.    Time 10    Period Weeks    Status On-going    Target Date 09/26/01              Plan - 10/02/21 0820     Clinical Impression Statement Pt arrives for today's treatment session denying any pain in left shoulder, although pt limps in with obvious left knee pain.  When questioned about left knee, but just says "it bothers me sometimes."  Pt able to demonstrate 120 degrees of AA flexion, utilizing right UE to assist LUE.  Pt also able to demonstrate 100 degrees of active abduction.  PROM performed to left shoulder in all directions with gentle hold at end range to increase ROM and function.  Pt able to tolerate increased reps and/or resistance with all exercises today.  Pt denied any pain upon completion of today's treatment.     Personal Factors and Comorbidities Other;Profession    Examination-Activity Limitations Reach Overhead;Bathing;Self Feeding;Carry;Sleep;Dressing;Hygiene/Grooming;Lift    Examination-Participation Restrictions Occupation;Cleaning;Community Activity;Yard Work    Stability/Clinical Decision Making Stable/Uncomplicated    Rehab Potential Good    PT Frequency 2x / week    PT Duration Other (comment)    PT Treatment/Interventions ADLs/Self Care Home Management;Cryotherapy;Electrical Stimulation;Moist Heat;Neuromuscular re-education;Therapeutic exercise;Therapeutic activities;Functional mobility training;Patient/family education;Manual techniques;Passive range of motion;Taping;Vasopneumatic Device    PT Next Visit Plan PROM, scapular retraction, pendulums, and modalities as needed   post-op11 weeks  09-25-21   Consulted and Agree with Plan of Care Patient            Progress Note Reporting Period 07/18/21 to 10/16/21  See note below for Objective Data and Assessment of Progress/Goals.    Patient is making fair progress   toward his goals for physical therapy. He continues to experienced limited left shoulder AROM and functional use of his left upper extremity. However, his functional use is partially limited by his left hand.   Jarrett Barts, PT, DPT    Jennifer A Rogers, PTA 10/16/2021, 8:58 AM     

## 2021-10-16 NOTE — Addendum Note (Signed)
Addended by: Granville Lewis on: 10/16/2021 12:49 PM   Modules accepted: Orders

## 2021-10-21 ENCOUNTER — Encounter: Payer: 59 | Admitting: Physical Therapy

## 2021-10-23 ENCOUNTER — Institutional Professional Consult (permissible substitution): Payer: 59 | Admitting: Neurology

## 2021-10-23 ENCOUNTER — Ambulatory Visit: Payer: 59

## 2021-10-23 DIAGNOSIS — M25512 Pain in left shoulder: Secondary | ICD-10-CM

## 2021-10-23 DIAGNOSIS — M25612 Stiffness of left shoulder, not elsewhere classified: Secondary | ICD-10-CM

## 2021-10-23 NOTE — Therapy (Signed)
OUTPATIENT PHYSICAL THERAPY TREATMENT NOTE   Patient Name: Ryan Rojas MRN: [id number] DOB:10-18-1965, 56 y.o., male Today's Date: 10/23/2021  PCP: Claretta Fraise, MD  REFERRING PROVIDER: Leandrew Koyanagi, MD   PT End of Session - 10/23/21 [phone]     Visit Number 21    Number of Visits 22    Date for PT Re-Evaluation 11/07/21    PT Start Time 0818    PT Stop Time 0858    PT Time Calculation (min) 40 min              Past Medical History:  Diagnosis Date   Hyperlipidemia    History reviewed. No pertinent surgical history. 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   Obstructive sleep apnea 09/11/2021   Nontraumatic complete tear of left rotator cuff 05/28/2021   Impingement syndrome of left shoulder 05/28/2021   Tendinopathy of left biceps tendon 05/28/2021   Arm paresthesia, left 01/02/2021   Chronic right shoulder pain 01/02/2021   Other fatigue 01/02/2021   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   Neuropathic pain 09/30/2020   Left arm weakness 09/17/2020   Left arm pain 09/17/2020   Neck pain on left side 09/17/2020   Cubital tunnel syndrome on left 08/16/2020   Intractable neuropathic pain of lumbosacral origin 05/20/2020   Combined arterial insufficiency and corporo-venous occlusive erectile dysfunction 09/27/2019   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   Impotence 01/18/2018   Hyperlipidemia 07/16/2017    REFERRING DIAG: Nontraumatic complete tear of left rotator cuff  THERAPY DIAG:  Acute pain of left shoulder  Stiffness of left shoulder, not elsewhere classified  Rationale for Evaluation and Treatment Rehabilitation  PERTINENT HISTORY: cannot grip with 4th and 5th fingers of the left hand  PRECAUTIONS: Left rotator cuff repair   SUBJECTIVE: Pt reports feeling good this morning.  Pt denies any pain today.   PAIN:  Are you having pain? No     TODAY'S  TREATMENT:                             8/10      EXERCISE LOG  Exercise Repetitions and Resistance Comments  UBE 60 RPM x 10 mins (5 mins forward/backward)   Pulleys X5 mins; flexion   Wall Push Ups 30 reps   Protractions Green tband x 25 reps   Rows Green tband x 25 reps   Extensions Green tband x 25 reps   External Rotation Green tband x 25 reps   Internal Rotation Green tband x 25 reps   Wall Slides Flexion x 2 mins   Ranger Standing: flexion/extension; arcs; x 2 mins each   Overhead Press 1# 2 sets of 10; right side against wall to avoid leaning   Standing flexion 1# x 20 reps Cues to avoid shoulder hiking  Standing Abduction 1# x 20 reps Cues to avoid shoulder hiking  Side Lying ER 3# ankle weight on wrist x 25 reps   Side Lying Abduction 3# ankle weight on wrist x 25 reps   Side Lying Flexion 3# ankle weight on wrist x 25 reps    Blank cell = exercise not performed today                            PT Short Term Goals - 08/27/21 1104  PT SHORT TERM GOAL #1   Title Patient will be able to achieve at least 120 degrees of passive left shoulder flexion for improved shoulder moiblity.    Time 6    Period Weeks    Status Achieved   Target Date 08/29/21      PT SHORT TERM GOAL #2   Title Patient will be able to achieve at least 120 degrees of passive left shoulder abduction for improved shoulder moiblity.    Time 6    Period Weeks    Status Achieved   Target Date 08/29/21              PT Long Term Goals - 08/27/21 1104       PT LONG TERM GOAL #1   Title Patient will be independent with his HEP.    Time 10    Period Weeks    Status  Achieved   Target Date 09/26/21      PT LONG TERM GOAL #2   Title Patient will be able to demonstrate at least 120 degrees of active left shoulder flexion for improved function with overhead activities.    Time 10    Period Weeks    Status Partially Met (uses other arm to achieve)   Target Date 09/26/21      PT LONG TERM  GOAL #3   Title Patient will be able to demonstrate at least 120 degrees of active left shoulder abduction for improved function reaching overhead.    Time 10    Period Weeks    Status On-going (10/16/21: 100 degrees)   Target Date 09/26/21      PT LONG TERM GOAL #4   Title Patient will be able to lift at least 10 pounds overhead for improved function with household activities.    Time 10    Period Weeks    Status On-going    Target Date 09/26/01              Plan - 10/02/21 0820     Clinical Impression Statement Pt arrives for today's treatment session denying any pain.  Pt reports that both shoulders are feeling better since receiving injection in right shoulder a few weeks ago. Pt requiring verbal and tactile cues to avoid shoulder hiking with standing flexion and abduction.  Pt places right hand on his left shoulder to avoid hiking with improved technique.  Pt reports that he has an appointment with his MD early next week and hopes to be cleared for work at that time.  Pt reporting increased fatigue at completion of today's treatment session, but denies pain.   Personal Factors and Comorbidities Other;Profession    Examination-Activity Limitations Reach Overhead;Bathing;Self Feeding;Carry;Sleep;Dressing;Hygiene/Grooming;Lift    Examination-Participation Restrictions Occupation;Cleaning;Community Activity;Yard Work    Stability/Clinical Decision Making Stable/Uncomplicated    Rehab Potential Good    PT Frequency 2x / week    PT Duration Other (comment)    PT Treatment/Interventions ADLs/Self Care Home Management;Cryotherapy;Electrical Stimulation;Moist Heat;Neuromuscular re-education;Therapeutic exercise;Therapeutic activities;Functional mobility training;Patient/family education;Manual techniques;Passive range of motion;Taping;Vasopneumatic Device    PT Next Visit Plan PROM, scapular retraction, pendulums, and modalities as needed   post-op11 weeks 09-25-21   Consulted and Agree with  Plan of Care Patient              Kathrynn Ducking, PTA 10/23/2021, 8:58 AM

## 2021-10-28 ENCOUNTER — Ambulatory Visit (INDEPENDENT_AMBULATORY_CARE_PROVIDER_SITE_OTHER): Payer: 59 | Admitting: Orthopaedic Surgery

## 2021-10-28 DIAGNOSIS — Z9889 Other specified postprocedural states: Secondary | ICD-10-CM | POA: Diagnosis not present

## 2021-10-28 NOTE — Progress Notes (Signed)
Office Visit Note   Patient: Ryan Rojas           Date of Birth: 1965/10/01           MRN: [id number] Visit Date: 10/28/2021              Requested by: Mechele Claude, MD [address] [place],  [place] [address] PCP: Mechele Claude, MD   Assessment & Plan: Visit Diagnoses:  1. S/P left rotator cuff repair     Plan: Patient is a pleasant 56 year old gentleman who comes in today 4 months status post left shoulder arthroscopic debridement rotator cuff repair and biceps tenodesis 07/03/2021.  He has been in physical therapy but just finished last week.  He has been slow to progress with range of motion.  He feels like this is not pain mediated but rather weakness.  He continues to work on a home exercise program as well.  Examination of the left shoulder reveals forward flexion to approximately 110 degrees.  I can passively get him to approximately 170 degrees.  4 out of 5 strength throughout.  He is neurovascular intact distally.  At this point, I believe the patient would benefit from continued formal physical therapy as well as a home exercise program specifically to work on range of motion and strengthening.  I will allow him to return to work this coming Monday desk work only for the next 6 weeks.  Follow-up in 6 weeks for recheck.  Call with concerns or questions.  Follow-Up Instructions: Return in about 6 weeks (around 12/09/2021).   Orders:  No orders of the defined types were placed in this encounter.  No orders of the defined types were placed in this encounter.     Procedures: No procedures performed   Clinical Data: No additional findings.   Subjective: Chief Complaint  Patient presents with   Right Shoulder - Pain    HPI  Review of Systems   Objective: Vital Signs: There were no vitals taken for this visit.  Physical Exam  Ortho Exam  Specialty Comments:  No specialty comments available.  Imaging: No results found.   PMFS History: Patient Active  Problem List   Diagnosis Date Noted   Right hand paresthesia 09/11/2021   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   Obstructive sleep apnea 09/11/2021   Nontraumatic complete tear of left rotator cuff 05/28/2021   Impingement syndrome of left shoulder 05/28/2021   Tendinopathy of left biceps tendon 05/28/2021   Arm paresthesia, left 01/02/2021   Chronic right shoulder pain 01/02/2021   Other fatigue 01/02/2021   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   Neuropathic pain 09/30/2020   Left arm weakness 09/17/2020   Left arm pain 09/17/2020   Neck pain on left side 09/17/2020   Cubital tunnel syndrome on left 08/16/2020   Intractable neuropathic pain of lumbosacral origin 05/20/2020   Combined arterial insufficiency and corporo-venous occlusive erectile dysfunction 09/27/2019   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   Impotence 01/18/2018   Hyperlipidemia 07/16/2017   Past Medical History:  Diagnosis Date   Hyperlipidemia     Family History  Problem Relation Age of Onset   Asthma Mother    Diabetes Sister    Asthma Sister     No past surgical history on file. Social History   Occupational History   Not on file  Tobacco Use   Smoking status: Never   Smokeless tobacco: Never  Vaping Use   Vaping Use: Never used  Substance and Sexual  Activity   Alcohol use: Yes    Alcohol/week: 1.0 standard drink of alcohol    Types: 1 Cans of beer per week   Drug use: No   Sexual activity: Yes    Birth control/protection: None

## 2021-10-29 ENCOUNTER — Ambulatory Visit (INDEPENDENT_AMBULATORY_CARE_PROVIDER_SITE_OTHER): Payer: 59 | Admitting: Neurology

## 2021-10-29 ENCOUNTER — Encounter: Payer: Self-pay | Admitting: Neurology

## 2021-10-29 ENCOUNTER — Ambulatory Visit: Payer: 59 | Admitting: Neurology

## 2021-10-29 VITALS — BP 115/81 | HR 80 | Ht 72.0 in | Wt 246.0 lb

## 2021-10-29 DIAGNOSIS — G545 Neuralgic amyotrophy: Secondary | ICD-10-CM | POA: Diagnosis not present

## 2021-10-29 DIAGNOSIS — R202 Paresthesia of skin: Secondary | ICD-10-CM

## 2021-10-29 DIAGNOSIS — M79602 Pain in left arm: Secondary | ICD-10-CM

## 2021-10-29 DIAGNOSIS — G4733 Obstructive sleep apnea (adult) (pediatric): Secondary | ICD-10-CM | POA: Diagnosis not present

## 2021-10-29 DIAGNOSIS — R29898 Other symptoms and signs involving the musculoskeletal system: Secondary | ICD-10-CM

## 2021-10-29 NOTE — Progress Notes (Signed)
ASSESSMENT AND PLAN  Ryan Rojas is a 56 y.o. male   Left lower cervical brachial plexopathy Presented with subacute onset left ulnar arm, left chest pain, followed by weakness since early June 2022  In the dermatome of left C8, T1,  EMG nerve conduction study in August 2022 demonstrate left lower brachial plexopathy, involving left C8-T1 mild involvement of C7  MRI left shoulder in July 2022 showed full-thickness incomplete tearing of the anterior supraspinatus tendon, which would not explain his current difficulty  MRI of cervical also demonstrated C5-6, C6-7 degenerative disease, with moderate canal stenosis  Overall improved, but unfortunately complicated by his left shoulder rotator cuff disease, with laparoscopic procedure in March 2023,   EMG nerve conduction study today showed continued evidence of left brachial plexopathy, mainly involving left lower trunk, C8, T1 more than C7, compared to previous study in August 2022, he has mild improvement,  Right hand paresthesia, right shoulder pain,  Improved to his right shoulder injection, EMG nerve conduction study showed no significant right upper extremity focal neuropathy, no evidence of active right cervical radiculopathy,  High risk for obstructive sleep apnea,   DIAGNOSTIC DATA (LABS, IMAGING, TESTING) - I reviewed patient records, labs, notes, testing and imaging myself where available.  Laboratory evaluation on September 05, 2020, normal CMP, creatinine one 0.4, negative troponin, normal CBC, hemoglobin of 14.6,  HISTORICAL  Ryan Rojas is a 56 year old left handed male, seen in request by orthopedic surgeon Dr. Roda Shutters, [place] and his primary care physician Stacks, [place], for evaluation of subacute onset left arm pain, left anterior chest muscle pain, initial evaluation was on September 17, 2020.  I reviewed and summarized the referring note. PMHX HLD,  Low back pain,   He works as Armed forces technical officer, spent most of the time  writing, working on [employer], he woke up 1 day in the middle of May 2022, noticed numbness tingling painful sensation at the left ulnar forearm, extending to left fourth and the fifth fingers,  His symptoms continue to progress in the following days, later on the neuropathic pain spread to left arm involving left armpit, left anterior chest, he described significant sharp shooting pain, worry about the heart attack, he presented to local emergency room, there was no cardiac etiology found to explain his left chest inner arm discomfort  About a week later, he began to noticed left hand weakness, difficulty to extend his left lateral to 3 fingers, weak grip,  Over the past couple months, his significant pain has much improved, but he still has intermittent flareup of the pain, especially when he coughs, moves suddenly, he would have such radiating pain at the left armpit to his left anterior chest, left ulnar arm and forearm, he continues to have weakness of left hands, mild improvement with Lyrica 200 mg daily  He denies gait abnormality, he denies bowel bladder incontinence, he denies right upper extremity involvement  Electrodiagnostic study in August 2022 confirmed diagnosis of left lower brachial plexopathy, mainly involving C8-T1, with minor involvement of C7,  He overall has not improved with, unfortunately he began to experience increased left and right shoulder pain,  MRI of left shoulder showed full-thickness incomplete tearing of the anterior supraspinatus tendon insertion with up to 7 mm of tendon retraction, long head biceps tendon is medially perched at the group entry zone with focal tendinosis,  He was seen by orthopedic surgeon Dr. Roda Shutters, nontraumatic complete tear of left rotator cuff, underwent laparoscopic surgery in March 2023, followed by  physical therapy, with some improvement range of motion of left shoulder, continue to have left hand numbness, weakness  Now he complains of  increased right shoulder pain, MRI of right shoulder in November 2022 showed moderate tendinosis of the supraspinatus tendon with a small partial-thickness articular surface tear, mild tendinosis of the infraspinatus tendon, partial-thickness tear of the peripheral fibers of the subscapularis tendon, partial-thickness cartilage loss of the glenohumeral joint with area of high-grade partial-thickness cartilage loss of the inferior glenoid with subchondral reactive marrow change  More right shoulder orthopedic intervention is pending, since April 2023, he noticed intermittent episode of right hand numbness tingling, mainly involving 3rd-5th finger, no persistent sensory loss, no weakness  Also complains of increased left knee pain, no bowel or bladder incontinence  We personally reviewed MRI of cervical spine November 2022, multilevel degenerative changes, prominent disc osteophyte diffuse bulging at C5-6 with moderate by foraminal stenosis, C6-7 also showed moderate left foraminal stenosis, mild canal stenosis no spinal cord compression  In addition, he complains of poor sleep quality, frequent snoring, wake up in the middle of the night, sometimes to the point of difficulty catching his breath To use inhaler, excessive daytime sleepiness, fatigue  upDate October 29, 2021: He continues to have significant limited range of motion of left shoulder despite arthroscopic surgery for left rotator cuff in March 2023, right shoulder pain and right hand symptoms has improved with right shoulder injection recently  He return for EMG nerve conduction study today, continue evidence of chronic neuropathic changes involving left C8, T1 more than C7 myotomes, consistent with previous diagnosis of left brachial plexopathy, mainly involving left lower trunk, compared to previous study in August 2022, noticed moderate improvement as evident by much improved left ulnar motor CMAP amplitude    PHYSICAL EXAM:    Blood  pressure 115/81, heart rate of 80, Gen: NAD, conversant, well nourised, well groomed          NEUROLOGICAL EXAM:   MENTAL STATUS: Speech/cognition: Awake, alert, oriented to history taking and casual conversation   CRANIAL NERVES: CN II: Visual fields are full to confrontation. Pupils are round equal and briskly reactive to light. CN III, IV, VI: extraocular movement are normal. No ptosis. CN V: Facial sensation is intact to light touch CN VII: Face is symmetric with normal eye closure  CN VIII: Hearing is normal to causal conversation. CN IX, X: Phonation is normal. CN XI: Head turning and shoulder shrug are intact CN XII: Narrow oropharyngeal space  MOTOR: Limited range of motion of left shoulder, maximum left shoulder extension 90 degree, with apparent pain with left shoulder movement, no significant left proximal muscle weakness, moderate left hand intrinsic muscle atrophy, mild left wrist extension/flexion weakness 4 -, moderate finger abduction extension flexion weakness, 3  Right upper extremity muscles bilateral lower extremity muscles were within normal limit L REFLEXES: Reflexes are1 and symmetric at the biceps, triceps, knees, and ankles. Plantar responses are flexor.  SENSORY: Decreased light touch at dorsum of left hand, involving left hand fingertips  COORDINATION: There is no trunk or limb dysmetria noted.  GAIT/STANCE: He needs push-up to get up from seated position, mildly antalgic  REVIEW OF SYSTEMS:  Full 14 system review of systems performed and notable only for as above All other review of systems were negative.   ALLERGIES: Allergies  Allergen Reactions   Hydrocodone Itching   Oxycodone Hives    HOME MEDICATIONS: Current Outpatient Medications  Medication Sig Dispense Refill   atorvastatin (LIPITOR)  40 MG tablet Take 1 tablet (40 mg total) by mouth daily. 90 tablet 3   celecoxib (CELEBREX) 100 MG capsule Take 1 capsule (100 mg total) by mouth 2  (two) times daily as needed. 60 capsule 3   diphenhydrAMINE HCl (BENADRYL ALLERGY PO) Take by mouth as needed.     DULoxetine (CYMBALTA) 60 MG capsule TAKE 1 CAPSULE BY MOUTH EVERY DAY 90 capsule 1   HYDROmorphone (DILAUDID) 2 MG tablet Take 1 tablet (2 mg total) by mouth at bedtime as needed for severe pain. 20 tablet 0   ondansetron (ZOFRAN) 4 MG tablet Take 1 tablet (4 mg total) by mouth every 8 (eight) hours as needed for nausea or vomiting. 40 tablet 0   sildenafil (VIAGRA) 100 MG tablet TAKE 0.5-1 TABLETS BY MOUTH DAILY AS NEEDED FOR ERECTILE DYSFUNCTION. 6 tablet 7   zolpidem (AMBIEN) 5 MG tablet Take 1 tablet (5 mg total) by mouth at bedtime as needed for sleep. 30 tablet 0   No current facility-administered medications for this visit.    PAST MEDICAL HISTORY: Past Medical History:  Diagnosis Date   Hyperlipidemia     PAST SURGICAL HISTORY: No past surgical history on file.  FAMILY HISTORY: Family History  Problem Relation Age of Onset   Asthma Mother    Diabetes Sister    Asthma Sister     SOCIAL HISTORY: Social History   Socioeconomic History   Marital status: Single    Spouse name: Not on file   Number of children: Not on file   Years of education: Not on file   Highest education level: Not on file  Occupational History   Not on file  Tobacco Use   Smoking status: Never   Smokeless tobacco: Never  Vaping Use   Vaping Use: Never used  Substance and Sexual Activity   Alcohol use: Yes    Alcohol/week: 1.0 standard drink of alcohol    Types: 1 Cans of beer per week   Drug use: No   Sexual activity: Yes    Birth control/protection: None  Other Topics Concern   Not on file  Social History Narrative   Not on file   Social Determinants of Health   Financial Resource Strain: Not on file  Food Insecurity: Not on file  Transportation Needs: Not on file  Physical Activity: Not on file  Stress: Not on file  Social Connections: Not on file  Intimate  Partner Violence: Not on file      Levert Feinstein, M.D. Ph.D.  [facility] [address], [address] [place], [place] [address] Ph: [phone] Fax: [phone]  CC:  Mechele Claude, MD [address] [place],  [place] [address]  Mechele Claude, MD

## 2021-10-30 ENCOUNTER — Telehealth: Payer: Self-pay | Admitting: Orthopaedic Surgery

## 2021-10-30 NOTE — Telephone Encounter (Signed)
[employer] forms received. To [employer]. 

## 2021-11-04 ENCOUNTER — Ambulatory Visit: Payer: 59 | Admitting: Neurology

## 2021-11-05 ENCOUNTER — Ambulatory Visit (INDEPENDENT_AMBULATORY_CARE_PROVIDER_SITE_OTHER): Payer: 59 | Admitting: Neurology

## 2021-11-05 ENCOUNTER — Encounter: Payer: Self-pay | Admitting: Neurology

## 2021-11-05 VITALS — BP 123/76 | HR 84 | Ht 72.0 in | Wt 243.0 lb

## 2021-11-05 DIAGNOSIS — Z79891 Long term (current) use of opiate analgesic: Secondary | ICD-10-CM | POA: Insufficient documentation

## 2021-11-05 DIAGNOSIS — R0689 Other abnormalities of breathing: Secondary | ICD-10-CM | POA: Insufficient documentation

## 2021-11-05 DIAGNOSIS — G4701 Insomnia due to medical condition: Secondary | ICD-10-CM

## 2021-11-05 DIAGNOSIS — Z9189 Other specified personal risk factors, not elsewhere classified: Secondary | ICD-10-CM | POA: Insufficient documentation

## 2021-11-05 DIAGNOSIS — R519 Headache, unspecified: Secondary | ICD-10-CM

## 2021-11-05 DIAGNOSIS — G8929 Other chronic pain: Secondary | ICD-10-CM

## 2021-11-05 HISTORY — DX: Other specified personal risk factors, not elsewhere classified: Z91.89

## 2021-11-05 NOTE — Patient Instructions (Signed)
Screening for Sleep Apnea  Sleep apnea is a condition in which breathing pauses or becomes shallow during sleep. Sleep apnea screening is a test to determine if you are at risk for sleep apnea. The test includes a series of questions. It will only takes a few minutes. Your health care provider may ask you to have this test in preparation for surgery or as part of a physical exam. What are the symptoms of sleep apnea? Common symptoms of sleep apnea include: Snoring. Waking up often at night. Daytime sleepiness. Pauses in breathing. Choking or gasping during sleep. Irritability. Forgetfulness. Trouble thinking clearly. Depression. Personality changes. Most people with sleep apnea do not know that they have it. What are the advantages of sleep apnea screening? Getting screened for sleep apnea can help: Ensure your safety. It is important for your health care providers to know whether or not you have sleep apnea, especially if you are having surgery or have other long-term (chronic) health conditions. Improve your health and allow you to get a better night's rest. Restful sleep can help you: Have more energy. Lose weight. Improve high blood pressure. Improve diabetes management. Prevent stroke. Prevent car accidents. What happens during the screening? Screening usually includes being asked a list of questions about your sleep quality. Some questions you may be asked include: Do you snore? Is your sleep restless? Do you have daytime sleepiness? Has a partner or spouse told you that you stop breathing during sleep? Have you had trouble concentrating or memory loss? What is your age? What is your neck circumference? To measure your neck, keep your back straight and gently wrap the tape measure around your neck. Put the tape measure at the middle of your neck, between your chin and collarbone. What is your sex assigned at birth? Do you have or are you being treated for high blood  pressure? If your screening test is positive, you are at risk for the condition. Further testing may be needed to confirm a diagnosis of sleep apnea. Where to find more information You can find screening tools online or at your health care clinic. For more information about sleep apnea screening and healthy sleep, visit these websites: Centers for Disease Control and Prevention: www.cdc.gov American Sleep Apnea Association: www.sleepapnea.org Contact a health care provider if: You think that you may have sleep apnea. Summary Sleep apnea screening can help determine if you are at risk for sleep apnea. It is important for your health care providers to know whether or not you have sleep apnea, especially if you are having surgery or have other chronic health conditions. You may be asked to take a screening test for sleep apnea in preparation for surgery or as part of a physical exam. This information is not intended to replace advice given to you by your health care provider. Make sure you discuss any questions you have with your health care provider. Document Revised: 02/09/2020 Document Reviewed: 02/09/2020 Elsevier Patient Education  2023 [employer]  

## 2021-11-05 NOTE — Progress Notes (Signed)
SLEEP MEDICINE CLINIC    Provider:  Melvyn Novas, MD  Primary Care Physician:  Mechele Claude, MD [address] [place] [address]     Referring Provider: Levert Feinstein, Md [address] [place],  [place] [address]          Chief Complaint according to patient   Patient presents with:     New Patient (Initial Visit)           HISTORY OF PRESENT ILLNESS:  Ryan Rojas is a 56 y.o.  African American male patient seen here as a referral on 11/05/2021 from Dr. Terrace Arabia, MD  for a sleep consultation. .  Chief concern according to patient :  . Pt referred for sleep consult due to poor sleep quality, frequent awakening, catching for air, dry mouth, high risk for obstructive sleep apnea, very narrow oropharyngeal space, and obesity. He has trouble sleeping,  wakes up choking or gasping, Works days shift. 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    I have the pleasure of seeing Ryan Rojas today, a left-handed African American male with a possible sleep disorder.  He  has a past medical history of  Rotator-cuff injury left, plexopathy,  and Hyperlipidemia.   Sleep relevant medical history: Nocturia once , allergic sinuitis, seasonal.    Family medical /sleep history: NO other family member known to have OSA.    Social history:  Patient is working as Research scientist (medical), Insurance underwriter ,  and lives in a household alone.No pets, daytime . Tobacco use; never .   ETOH use : socially , 2 a week ,  Caffeine intake in form of Coffee( 1-2 a day) Soda( /) Tea ( /) no energy drinks. Regular exercise in form of PT, GYM. Marland Kitchen      Sleep habits are as follows: The patient's dinner time is between 6-7.30 PM.  The patient goes to bed at 10.30 PM and having trouble to sleep due to pain, he used to sleep in a recliner- continues to sleep for intervals of 1-2 hours, wakes from gasping.   The preferred sleep position is whatever is comfortable ,  with the support of 2-3 pillows- not adjustable .  Dreams are reportedly infrequent.  4.45 AM is the usual rise time. The patient wakes up spontaneously at 4 AM .  He reports  often not feeling refreshed or restored in AM, with symptoms such as dry mouth, morning headaches, and shoulder / arm pain with residual fatigue.  Naps are taken now infrequently, over 10 years ago e took power naps at lunch time lasting from 10 to 15 minutes and were refreshing .    Review of Systems: Out of a complete 14 system review, the patient complains of only the following symptoms, and all other reviewed systems are negative.:  Fatigue, sleepiness , snoring, fragmented sleep, Insomnia due to pain, gasping.    How likely are you to doze in the following situations: 0 = not likely, 1 = slight chance, 2 = moderate chance, 3 = high chance   Sitting and Reading? Watching Television? Sitting inactive in a public place (theater or meeting)? As a passenger in a car for an hour without a break? Lying down in the afternoon when circumstances permit? Sitting and talking to someone? Sitting quietly after lunch without alcohol? In a car, while stopped for a few minutes in traffic?   Total =  4/ 24 points   FSS endorsed at 39/ 63 points. Fatigue over sleepiness.   Social History   Socioeconomic History   Marital status: Single    Spouse name: Not on file   Number of children: 3   Years of education: Not on file   Highest education level: High school graduate  Occupational History   Not on file  Tobacco Use   Smoking status: Never   Smokeless tobacco: Never  Vaping Use   Vaping Use: Never used  Substance and Sexual Activity   Alcohol use: Yes    Alcohol/week: 1.0 standard drink of alcohol    Types: 1 Cans of beer per week   Drug use: No   Sexual activity: Yes    Birth control/protection: None  Other Topics Concern   Not on file  Social History Narrative   Lives alone   L handed   Caffeine: 1 C of  coffee every AM   Social Determinants of Health   Financial Resource Strain: Not on file  Food Insecurity: Not on file  Transportation Needs: Not on file  Physical Activity: Not on file  Stress: Not on file  Social Connections: Not on file    Family History  Problem Relation Age of Onset   Asthma Mother    Diabetes Sister    Asthma Sister     Past Medical History:  Diagnosis Date   Back pain    Hyperlipidemia     Past Surgical History:  Procedure Laterality Date   ROTATOR CUFF REPAIR Left 07/10/2021     Current Outpatient Medications on File Prior to Visit  Medication Sig Dispense Refill   atorvastatin (LIPITOR) 40 MG tablet Take 1 tablet (40 mg total) by mouth daily. 90 tablet 3   celecoxib (CELEBREX) 100 MG capsule Take 1 capsule (100 mg total) by mouth 2 (two) times daily as needed. 60 capsule 3   diphenhydrAMINE HCl (BENADRYL ALLERGY PO) Take by mouth as needed.     DULoxetine (CYMBALTA) 60 MG capsule TAKE 1 CAPSULE BY MOUTH EVERY DAY 90 capsule 1   HYDROmorphone (DILAUDID) 2 MG tablet Take 1 tablet (2 mg total) by mouth at bedtime as needed for severe pain. 20 tablet 0   ondansetron (ZOFRAN) 4 MG tablet Take 1 tablet (4 mg total) by mouth every 8 (eight) hours as needed for nausea or vomiting. 40 tablet 0   sildenafil (VIAGRA) 100 MG tablet TAKE 0.5-1 TABLETS BY MOUTH DAILY AS NEEDED FOR ERECTILE DYSFUNCTION. 6 tablet 7   zolpidem (AMBIEN) 5 MG tablet Take 1 tablet (5 mg total) by mouth at bedtime as needed for sleep. 30 tablet 0   No current facility-administered medications on file prior to visit.    Allergies  Allergen Reactions   Hydrocodone Itching   Oxycodone Hives     Presented with subacute onset left ulnar arm, left chest pain, followed by weakness since early June 2022             In the dermatome of left C8, T1,             EMG nerve conduction study in August 2022 demonstrate left lower brachial plexopathy, involving left C8-T1 mild involvement of  C7             MRI left shoulder in July 2022 showed full-thickness incomplete tearing of the anterior supraspinatus tendon, which would not explain his current difficulty  MRI of cervical also demonstrated C5-6, C6-7 degenerative disease, with moderate canal stenosis             Overall improved, but unfortunately complicated by his left shoulder rotator cuff disease, with laparoscopic procedure in March 2023,               EMG nerve conduction study today showed continued evidence of left brachial plexopathy, mainly involving left lower trunk, C8, T1 more than C7, compared to previous study in August 2022, he has mild improvement,  There were no vitals filed for this visit. There is no height or weight on file to calculate BMI.   Wt Readings from Last 3 Encounters:  10/29/21 246 lb (111.6 kg)  09/11/21 246 lb 8 oz (111.8 kg)  04/09/21 241 lb 12.8 oz (109.7 kg)     Ht Readings from Last 3 Encounters:  10/29/21 6' (1.829 m)  09/11/21 6' (1.829 m)  04/09/21 6' (1.829 m)    Physical exam: 123/ 76 mmHg. 84 regular HR.   6" , 243#    General: The patient is awake, alert and appears not in acute distress. The patient is well groomed. Head: Normocephalic, atraumatic. Neck is supple.  Mallampati 2-3,  neck circumference:18 inches . Nasal airflow patent.   Retrognathia is mild seen.  Dental status: biological  Cardiovascular:  Regular rate and cardiac rhythm by pulse,  without distended neck veins. Respiratory: Lungs are clear to auscultation.  Skin:  Without evidence of ankle edema, or rash. Left wrist is swollen.  Trunk: The patient's posture is erect.   Neurologic exam : The patient is awake and alert, oriented to place and time.   Memory subjective described as intact.  Attention span & concentration ability appears normal.  Speech is fluent,  without  dysarthria, dysphonia or aphasia.  Mood and affect are appropriate.   Cranial nerves: no loss of smell or taste  reported  Pupils are equal and briskly reactive to light. Funduscopic exam deferred. .  Extraocular movements in vertical and horizontal planes were intact and without nystagmus. No Diplopia. Visual fields by finger perimetry are intact. Hearing was intact to soft voice and finger rubbing.    Facial sensation intact to fine touch.  Facial motor strength is symmetric and tongue and uvula move midline.  Neck ROM : rotation, tilt and flexion extension were normal for age and shoulder shrug was asymmetrical. Left shoulder elevated, protective.    Motor exam:  Symmetric bulk, tone and ROM.   Normal tone without cog wheeling, loss of left (dominant !)  grip strength .   Sensory:  Fine touch and vibration were abnormal in left hand, numbness-  Proprioception tested in the upper extremities was normal.   Coordination: Rapid alternating movements in the fingers/hands were of normal speed.  The Finger-to-nose maneuver was intact without evidence of ataxia, dysmetria or tremor.   Gait and station: Patient could rise unassisted from a seated position, walked without assistive device.  Stance is of normal width/ base and the patient turned with 3 steps.  Toe and heel walk were deferred.  Deep tendon reflexes: in the  upper and lower extremities are symmetric and intact. Left triceps reflex intact.  Babinski response was deferred.       After spending a total time of  35  minutes face to face and additional time for physical and neurologic examination, review of laboratory studies,  personal review of imaging studies, reports and results of other testing and review  of referral information / records as far as provided in visit, I have established the following assessments:  1) frequent sleep interruption by pain. 2) sleep choking and gasping, recent weight gain due to not having exercises. Snoring is suspected , he wakes with a dry mouth, BMI is elevated. Large neck and small airway, seasonal  rhinitis.  3)  uses pain meds prn at night    My Plan is to proceed with:  1) Screening for sleep apnea, can be done in lab- may help to rule out central apneas ( gasping) ,  2) dilaudid for severe pain can induce central apneas- 3) chronic pain is main sleep interrupter.   I would like to thank Dr Terrace Arabia  for allowing me to meet with and to take care of this pleasant patient.   In short, Ryan Rojas is presenting with fragmented sleep, high fatigue and insomnia. I plan to follow up either personally or through our NP within 3-4 months.  .  Electronically signed by: Melvyn Novas, MD 11/05/2021 9:27 AM  [facility] Board certified by The [employer] of Sleep Medicine and Diplomate of the [employer] of [employer] Medicine. Board certified In Neurology through the ABPN, Fellow of the [employer] of Neu[employer]. Medical Director of [facility].

## 2021-11-07 NOTE — Progress Notes (Signed)
EMG report is under procedure 

## 2021-11-11 ENCOUNTER — Ambulatory Visit: Payer: 59 | Admitting: *Deleted

## 2021-11-11 ENCOUNTER — Encounter: Payer: Self-pay | Admitting: *Deleted

## 2021-11-11 DIAGNOSIS — M25512 Pain in left shoulder: Secondary | ICD-10-CM | POA: Diagnosis not present

## 2021-11-11 DIAGNOSIS — M25612 Stiffness of left shoulder, not elsewhere classified: Secondary | ICD-10-CM

## 2021-11-11 NOTE — Therapy (Addendum)
OUTPATIENT PHYSICAL THERAPY TREATMENT NOTE   Patient Name: Ryan Rojas MRN: [id number] DOB:[birth year], 56 y.o., male Today's Date: 11/11/2021  PCP: Claretta Fraise, MD  REFERRING PROVIDER: Leandrew Koyanagi, MD   PT End of Session - 11/11/21 1656     Visit Number 22    Number of Visits 34    Date for PT Re-Evaluation 12/09/21    PT Start Time 8727    PT Stop Time 6184    PT Time Calculation (min) 50 min              Past Medical History:  Diagnosis Date   Back pain    Hyperlipidemia    Past Surgical History:  Procedure Laterality Date   ROTATOR CUFF REPAIR Left 07/10/2021   Patient Active Problem List   Diagnosis Date Noted   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   Opiate analgesic use agreement exists 11/05/2021   Gasping for breath 11/05/2021   At risk for central sleep apnea 11/05/2021   Risk factors for obstructive sleep apnea 11/05/2021   Sleep related headaches 11/05/2021   Right hand paresthesia 09/11/2021   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   Obstructive sleep apnea 09/11/2021   Nontraumatic complete tear of left rotator cuff 05/28/2021   Impingement syndrome of left shoulder 05/28/2021   Tendinopathy of left biceps tendon 05/28/2021   Arm paresthesia, left 01/02/2021   Chronic right shoulder pain 01/02/2021   Other fatigue 01/02/2021   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   Neuropathic pain 09/30/2020   Left arm weakness 09/17/2020   Left arm pain 09/17/2020   Neck pain on left side 09/17/2020   Cubital tunnel syndrome on left 08/16/2020   Intractable neuropathic pain of lumbosacral origin 05/20/2020   Combined arterial insufficiency and corporo-venous occlusive erectile dysfunction 09/27/2019   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   Impotence 01/18/2018   Hyperlipidemia 07/16/2017    REFERRING DIAG: Nontraumatic complete tear of left rotator cuff  THERAPY DIAG:  Acute pain of left shoulder  Stiffness of left shoulder, not  elsewhere classified  Rationale for Evaluation and Treatment Rehabilitation  PERTINENT HISTORY: cannot grip with 4th and 5th fingers of the left hand  PRECAUTIONS: Left rotator cuff repair   SUBJECTIVE: Pt reports feeling good this morning.  Pt denies any pain today.   PAIN:  Are you having pain? No     TODAY'S TREATMENT:   11-11-21 LT UE UBE x 10 mins 60 RPMs Supine 2# flexion 2x10 from 90degrees to end-range for Lat stretch, 1# flexion 3x10 supine full ROM  Manual:  End-range flexion isometrics with Pt supine 3x5 hold 5 secs  and the same for ER at 90/90 isometrics 3x5 hold 5 secs at end-range PROM for flexion and ER Rhythmic stabilization supine LT at many angles above 90 degrees elevation                                       8/10      EXERCISE LOG  Exercise Repetitions and Resistance Comments  UBE 60 RPM x 10 mins (5 mins forward/backward)   Pulleys X5 mins; flexion   Wall Push Ups 30 reps   Protractions Green tband x 25 reps   Rows Green tband x 25 reps   Extensions Green tband x 25 reps   External Rotation Green tband x 25 reps   Internal Rotation Green tband x 25 reps  Wall Slides Flexion x 2 mins   Ranger Standing: flexion/extension; arcs; x 2 mins each   Overhead Press 1# 2 sets of 10; right side against wall to avoid leaning   Standing flexion 1# x 20 reps Cues to avoid shoulder hiking  Standing Abduction 1# x 20 reps Cues to avoid shoulder hiking  Side Lying ER 3# ankle weight on wrist x 25 reps   Side Lying Abduction 3# ankle weight on wrist x 25 reps   Side Lying Flexion 3# ankle weight on wrist x 25 reps    Blank cell = exercise not performed today                            PT Short Term Goals - 08/27/21 1104       PT SHORT TERM GOAL #1   Title Patient will be able to achieve at least 120 degrees of passive left shoulder flexion for improved shoulder moiblity.    Time 6    Period Weeks    Status Achieved   Target Date 08/29/21       PT SHORT TERM GOAL #2   Title Patient will be able to achieve at least 120 degrees of passive left shoulder abduction for improved shoulder moiblity.    Time 6    Period Weeks    Status Achieved   Target Date 08/29/21              PT Long Term Goals - 08/27/21 1104       PT LONG TERM GOAL #1   Title Patient will be independent with his HEP.    Time 10    Period Weeks    Status  Achieved   Target Date 09/26/21      PT LONG TERM GOAL #2   Title Patient will be able to demonstrate at least 120 degrees of active left shoulder flexion for improved function with overhead activities.    Time 10    Period Weeks    Status Partially Met (uses other arm to achieve)   Target Date 09/26/21      PT LONG TERM GOAL #3   Title Patient will be able to demonstrate at least 120 degrees of active left shoulder abduction for improved function reaching overhead.    Time 10    Period Weeks    Status On-going (10/16/21: 100 degrees)   Target Date 09/26/21      PT LONG TERM GOAL #4   Title Patient will be able to lift at least 10 pounds overhead for improved function with household activities.    Time 10    Period Weeks    Status On-going    Target Date 09/26/01              Plan - 11/11/21 0820     Clinical Impression Statement Pt arrived today reporting MD wants 6 more eks of PT to cont. For strengthening RT UE. Rx focused on RT shldr end range isometrics for flexion and ER in supine as well as strengthening in the supine position to decrease shldr hiking.   Personal Factors and Comorbidities Other;Profession    Examination-Activity Limitations Reach Overhead;Bathing;Self Feeding;Carry;Sleep;Dressing;Hygiene/Grooming;Lift    Examination-Participation Restrictions Occupation;Cleaning;Community Activity;Yard Work    Stability/Clinical Decision Making Stable/Uncomplicated    Rehab Potential Good    PT Frequency 2x / week    PT Duration Other (comment)    PT Treatment/Interventions  ADLs/Self Care  Home Management;Cryotherapy;Electrical Stimulation;Moist Heat;Neuromuscular re-education;Therapeutic exercise;Therapeutic activities;Functional mobility training;Patient/family education;Manual techniques;Passive range of motion;Taping;Vasopneumatic Device    PT Next Visit Plan PROM, scapular retraction, pendulums, and modalities as needed   post-op11 weeks 09-25-21   Consulted and Agree with Plan of Care Patient              Cosima Prentiss,CHRIS, PTA 11/11/2021, 6:29 PM

## 2021-11-13 ENCOUNTER — Ambulatory Visit: Payer: 59 | Admitting: *Deleted

## 2021-11-13 DIAGNOSIS — M25512 Pain in left shoulder: Secondary | ICD-10-CM | POA: Diagnosis not present

## 2021-11-13 DIAGNOSIS — M25612 Stiffness of left shoulder, not elsewhere classified: Secondary | ICD-10-CM

## 2021-11-13 NOTE — Therapy (Signed)
OUTPATIENT PHYSICAL THERAPY TREATMENT NOTE   Patient Name: Ryan Rojas MRN: [id number] DOB:November 13, 1965, 56 y.o., male Today's Date: 11/13/2021  PCP: Claretta Fraise, MD  REFERRING PROVIDER: Leandrew Koyanagi, MD   PT End of Session - 11/13/21 1740     Visit Number 23    Number of Visits 34    Date for PT Re-Evaluation 12/09/21    PT Start Time 4235    PT Stop Time 3614    PT Time Calculation (min) 45 min              Past Medical History:  Diagnosis Date   Back pain    Hyperlipidemia    Past Surgical History:  Procedure Laterality Date   ROTATOR CUFF REPAIR Left 07/10/2021   Patient Active Problem List   Diagnosis Date Noted   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   Opiate analgesic use agreement exists 11/05/2021   Gasping for breath 11/05/2021   At risk for central sleep apnea 11/05/2021   Risk factors for obstructive sleep apnea 11/05/2021   Sleep related headaches 11/05/2021   Right hand paresthesia 09/11/2021   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   Obstructive sleep apnea 09/11/2021   Nontraumatic complete tear of left rotator cuff 05/28/2021   Impingement syndrome of left shoulder 05/28/2021   Tendinopathy of left biceps tendon 05/28/2021   Arm paresthesia, left 01/02/2021   Chronic right shoulder pain 01/02/2021   Other fatigue 01/02/2021   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   Neuropathic pain 09/30/2020   Left arm weakness 09/17/2020   Left arm pain 09/17/2020   Neck pain on left side 09/17/2020   Cubital tunnel syndrome on left 08/16/2020   Intractable neuropathic pain of lumbosacral origin 05/20/2020   Combined arterial insufficiency and corporo-venous occlusive erectile dysfunction 09/27/2019   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   Impotence 01/18/2018   Hyperlipidemia 07/16/2017    REFERRING DIAG: Nontraumatic complete tear of left rotator cuff  THERAPY DIAG:  Acute pain of left shoulder  Stiffness of left shoulder, not  elsewhere classified  Rationale for Evaluation and Treatment Rehabilitation  PERTINENT HISTORY: cannot grip with 4th and 5th fingers of the left hand  PRECAUTIONS: Left rotator cuff repair   SUBJECTIVE: Pt reports feeling good this morning.  Pt denies any pain today.   PAIN:  Are you having pain? No     TODAY'S TREATMENT:   11-13-21 LT UE UBE x 10 mins 60 RPMs Supine 2# flexion 2x10 from 90 degrees to end-range for Lat stretch, 2# flexion 3x10 supine full ROM  Manual:  End-range flexion isometrics with Pt supine 3x6 hold 6 secs  and the same for ER at 90/90 isometrics 3x6 hold 6 secs at end-range PROM for flexion and ER Rhythmic stabilization supine LT at many angles above 90 and at end-range degrees elevation also with ER                                       8/10      EXERCISE LOG  Exercise Repetitions and Resistance Comments  UBE 60 RPM x 10 mins (5 mins forward/backward)   Pulleys X5 mins; flexion   Wall Push Ups 30 reps   Protractions Green tband x 25 reps   Rows Green tband x 25 reps   Extensions Green tband x 25 reps   External Rotation Green tband x 25 reps   Internal  Rotation Green tband x 25 reps   Wall Slides Flexion x 2 mins   Ranger Standing: flexion/extension; arcs; x 2 mins each   Overhead Press 1# 2 sets of 10; right side against wall to avoid leaning   Standing flexion 1# x 20 reps Cues to avoid shoulder hiking  Standing Abduction 1# x 20 reps Cues to avoid shoulder hiking  Side Lying ER 3# ankle weight on wrist x 25 reps   Side Lying Abduction 3# ankle weight on wrist x 25 reps   Side Lying Flexion 3# ankle weight on wrist x 25 reps    Blank cell = exercise not performed today                            PT Short Term Goals - 08/27/21 1104       PT SHORT TERM GOAL #1   Title Patient will be able to achieve at least 120 degrees of passive left shoulder flexion for improved shoulder moiblity.    Time 6    Period Weeks    Status Achieved    Target Date 08/29/21      PT SHORT TERM GOAL #2   Title Patient will be able to achieve at least 120 degrees of passive left shoulder abduction for improved shoulder moiblity.    Time 6    Period Weeks    Status Achieved   Target Date 08/29/21              PT Long Term Goals - 08/27/21 1104       PT LONG TERM GOAL #1   Title Patient will be independent with his HEP.    Time 10    Period Weeks    Status  Achieved   Target Date 09/26/21      PT LONG TERM GOAL #2   Title Patient will be able to demonstrate at least 120 degrees of active left shoulder flexion for improved function with overhead activities.    Time 10    Period Weeks    Status Partially Met (uses other arm to achieve)   Target Date 09/26/21      PT LONG TERM GOAL #3   Title Patient will be able to demonstrate at least 120 degrees of active left shoulder abduction for improved function reaching overhead.    Time 10    Period Weeks    Status On-going (10/16/21: 100 degrees)   Target Date 09/26/21      PT LONG TERM GOAL #4   Title Patient will be able to lift at least 10 pounds overhead for improved function with household activities.    Time 10    Period Weeks    Status On-going    Target Date 09/26/01              Plan - 11/13/21 0820     Clinical Impression Statement Rx focused on RT shldr end range isometrics for flexion and ER in supine as well as strengthening in the supine position to decrease shldr hiking.   Personal Factors and Comorbidities Other;Profession    Examination-Activity Limitations Reach Overhead;Bathing;Self Feeding;Carry;Sleep;Dressing;Hygiene/Grooming;Lift    Examination-Participation Restrictions Occupation;Cleaning;Community Activity;Yard Work    Stability/Clinical Decision Making Stable/Uncomplicated    Rehab Potential Good    PT Frequency 2x / week    PT Duration Other (comment)    PT Treatment/Interventions ADLs/Self Care Home Management;Cryotherapy;Electrical  Stimulation;Moist Heat;Neuromuscular re-education;Therapeutic exercise;Therapeutic activities;Functional mobility training;Patient/family  education;Manual techniques;Passive range of motion;Taping;Vasopneumatic Device    PT Next Visit Plan PROM, scapular retraction, pendulums, and modalities as needed   post-op11 weeks 09-25-21   Consulted and Agree with Plan of Care Patient              Kinzlie Harney,CHRIS, PTA 11/13/2021, 5:49 PM

## 2021-11-13 NOTE — Addendum Note (Signed)
Addended by: Granville Lewis on: 11/13/2021 05:45 PM   Modules accepted: Orders

## 2021-11-18 ENCOUNTER — Ambulatory Visit: Payer: 59 | Attending: Orthopaedic Surgery

## 2021-11-18 ENCOUNTER — Other Ambulatory Visit: Payer: Self-pay | Admitting: Family Medicine

## 2021-11-18 DIAGNOSIS — N5203 Combined arterial insufficiency and corporo-venous occlusive erectile dysfunction: Secondary | ICD-10-CM

## 2021-11-18 DIAGNOSIS — M25612 Stiffness of left shoulder, not elsewhere classified: Secondary | ICD-10-CM | POA: Insufficient documentation

## 2021-11-18 DIAGNOSIS — M25512 Pain in left shoulder: Secondary | ICD-10-CM | POA: Insufficient documentation

## 2021-11-18 NOTE — Telephone Encounter (Signed)
Last office visit 04/09/21 Last refill 05/17/21, #6, 7 refills

## 2021-11-19 ENCOUNTER — Encounter: Payer: Self-pay | Admitting: Neurology

## 2021-11-20 ENCOUNTER — Ambulatory Visit: Payer: 59

## 2021-11-20 DIAGNOSIS — M25512 Pain in left shoulder: Secondary | ICD-10-CM | POA: Diagnosis present

## 2021-11-20 DIAGNOSIS — M25612 Stiffness of left shoulder, not elsewhere classified: Secondary | ICD-10-CM | POA: Diagnosis present

## 2021-11-20 NOTE — Therapy (Signed)
OUTPATIENT PHYSICAL THERAPY TREATMENT NOTE   Patient Name: Ryan Rojas MRN: [id number] DOB:[birth year], 56 y.o., male Today's Date: 11/20/2021  PCP: Claretta Fraise, MD  REFERRING PROVIDER: Leandrew Koyanagi, MD   PT End of Session - 11/20/21 1518     Visit Number 24    Number of Visits 30    Date for PT Re-Evaluation 12/09/21    PT Start Time 8937    PT Stop Time 1558    PT Time Calculation (min) 43 min    Activity Tolerance Patient tolerated treatment well    Behavior During Therapy [facility] for tasks assessed/performed               Past Medical History:  Diagnosis Date   Back pain    Hyperlipidemia    Past Surgical History:  Procedure Laterality Date   ROTATOR CUFF REPAIR Left 07/10/2021   Patient Active Problem List   Diagnosis Date Noted   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   Opiate analgesic use agreement exists 11/05/2021   Gasping for breath 11/05/2021   At risk for central sleep apnea 11/05/2021   Risk factors for obstructive sleep apnea 11/05/2021   Sleep related headaches 11/05/2021   Right hand paresthesia 09/11/2021   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   Obstructive sleep apnea 09/11/2021   Nontraumatic complete tear of left rotator cuff 05/28/2021   Impingement syndrome of left shoulder 05/28/2021   Tendinopathy of left biceps tendon 05/28/2021   Arm paresthesia, left 01/02/2021   Chronic right shoulder pain 01/02/2021   Other fatigue 01/02/2021   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   Neuropathic pain 09/30/2020   Left arm weakness 09/17/2020   Left arm pain 09/17/2020   Neck pain on left side 09/17/2020   Cubital tunnel syndrome on left 08/16/2020   Intractable neuropathic pain of lumbosacral origin 05/20/2020   Combined arterial insufficiency and corporo-venous occlusive erectile dysfunction 09/27/2019   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   Impotence 01/18/2018   Hyperlipidemia 07/16/2017    REFERRING DIAG: Nontraumatic  complete tear of left rotator cuff  THERAPY DIAG:  Acute pain of left shoulder  Stiffness of left shoulder, not elsewhere classified  Rationale for Evaluation and Treatment Rehabilitation  PERTINENT HISTORY: cannot grip with 4th and 5th fingers of the left hand  PRECAUTIONS: Left rotator cuff repair   SUBJECTIVE: Patient reports that his shoulder feels about the same as his last appointment. He notes that he is back to work, but he is not able to do all the things he was doing before.   PAIN:  Are you having pain? No     TODAY'S TREATMENT:                                     9/7 EXERCISE LOG  Exercise Repetitions and Resistance Comments  UBE  X 10 minutes @ 60 RPM    Shoulder extension Green t-band with slight ABD; 3 minutes   Shoulder flexion  Green t-band; 3 minutes Standing to 45 degrees  Supine shoulder flexion 25 reps x 2 lbs To 90 degrees  Supine ball circles 3 minutes w/ 2 pound ball   Supine cane press up 2 minutes w/ 5 pound weight   Side lying shoulder ABD  30 reps w/ 2 pound weight   Side lying shoulder ER 30 reps w/ 2 pound weight   Cane bicep curl 30 reps w/ 7.5  pounds weight   Shoulder flexion stretch  3 x 30 seconds    Blank cell = exercise not performed today    11-13-21 LT UE UBE x 10 mins 60 RPMs Supine 2# flexion 2x10 from 90 degrees to end-range for Lat stretch, 2# flexion 3x10 supine full ROM  Manual:  End-range flexion isometrics with Pt supine 3x6 hold 6 secs  and the same for ER at 90/90 isometrics 3x6 hold 6 secs at end-range PROM for flexion and ER Rhythmic stabilization supine LT at many angles above 90 and at end-range degrees elevation also with ER                            8/10      EXERCISE LOG  Exercise Repetitions and Resistance Comments  UBE 60 RPM x 10 mins (5 mins forward/backward)   Pulleys X5 mins; flexion   Wall Push Ups 30 reps   Protractions Green tband x 25 reps   Rows Green tband x 25 reps   Extensions Green tband x 25  reps   External Rotation Green tband x 25 reps   Internal Rotation Green tband x 25 reps   Wall Slides Flexion x 2 mins   Ranger Standing: flexion/extension; arcs; x 2 mins each   Overhead Press 1# 2 sets of 10; right side against wall to avoid leaning   Standing flexion 1# x 20 reps Cues to avoid shoulder hiking  Standing Abduction 1# x 20 reps Cues to avoid shoulder hiking  Side Lying ER 3# ankle weight on wrist x 25 reps   Side Lying Abduction 3# ankle weight on wrist x 25 reps   Side Lying Flexion 3# ankle weight on wrist x 25 reps    Blank cell = exercise not performed today                            PT Short Term Goals - 08/27/21 1104       PT SHORT TERM GOAL #1   Title Patient will be able to achieve at least 120 degrees of passive left shoulder flexion for improved shoulder moiblity.    Time 6    Period Weeks    Status Achieved   Target Date 08/29/21      PT SHORT TERM GOAL #2   Title Patient will be able to achieve at least 120 degrees of passive left shoulder abduction for improved shoulder moiblity.    Time 6    Period Weeks    Status Achieved   Target Date 08/29/21              PT Long Term Goals - 08/27/21 1104       PT LONG TERM GOAL #1   Title Patient will be independent with his HEP.    Time 10    Period Weeks    Status  Achieved   Target Date 09/26/21      PT LONG TERM GOAL #2   Title Patient will be able to demonstrate at least 120 degrees of active left shoulder flexion for improved function with overhead activities.    Time 10    Period Weeks    Status Partially Met (uses other arm to achieve)   Target Date 09/26/21      PT LONG TERM GOAL #3   Title Patient will be able to demonstrate at least 120 degrees  of active left shoulder abduction for improved function reaching overhead.    Time 10    Period Weeks    Status On-going (10/16/21: 100 degrees)   Target Date 09/26/21      PT LONG TERM GOAL #4   Title Patient will be able to lift at  least 10 pounds overhead for improved function with household activities.    Time 10    Period Weeks    Status On-going    Target Date 09/26/01              Plan - 11/13/21 0820     Clinical Impression Statement Patient was progressed with multiple new and familiar interventions for improved left shoulder endurance and strength. He required minimal cueing with today's supine ball circles to maintain elbow extension to isolate rotator cuff engagement. He reported no pain or discomfort with any of today's interventions. However, fatigue was his primary limitation with today's interventions. He reported that his shoulder was tired upon the conclusion of treatment. He continues to require skilled physical therapy to address his remaining impairments to maximize his functional mobility.    Personal Factors and Comorbidities Other;Profession    Examination-Activity Limitations Reach Overhead;Bathing;Self Feeding;Carry;Sleep;Dressing;Hygiene/Grooming;Lift    Examination-Participation Restrictions Occupation;Cleaning;Community Activity;Yard Work    Stability/Clinical Decision Making Stable/Uncomplicated    Rehab Potential Good    PT Frequency 2x / week    PT Duration Other (comment)    PT Treatment/Interventions ADLs/Self Care Home Management;Cryotherapy;Electrical Stimulation;Moist Heat;Neuromuscular re-education;Therapeutic exercise;Therapeutic activities;Functional mobility training;Patient/family education;Manual techniques;Passive range of motion;Taping;Vasopneumatic Device    PT Next Visit Plan PROM, scapular retraction, pendulums, and modalities as needed   post-op11 weeks 09-25-21   Consulted and Agree with Plan of Care Patient              Darlin Coco, PT 11/20/2021, 4:11 PM

## 2021-11-21 ENCOUNTER — Encounter: Payer: Self-pay | Admitting: Family Medicine

## 2021-11-23 NOTE — Telephone Encounter (Signed)
Mr. Burston would need to be seen. I can do the handicap form at the time of the visit.

## 2021-11-25 ENCOUNTER — Ambulatory Visit: Payer: 59

## 2021-11-25 ENCOUNTER — Telehealth: Payer: Self-pay | Admitting: Neurology

## 2021-11-25 NOTE — Telephone Encounter (Signed)
[employer] pending uploaded notes on the portal  

## 2021-11-27 ENCOUNTER — Encounter: Payer: 59 | Admitting: *Deleted

## 2021-12-01 ENCOUNTER — Institutional Professional Consult (permissible substitution): Payer: 59 | Admitting: Neurology

## 2021-12-02 ENCOUNTER — Ambulatory Visit: Payer: 59 | Admitting: Family Medicine

## 2021-12-02 ENCOUNTER — Ambulatory Visit: Payer: 59

## 2021-12-02 DIAGNOSIS — M25612 Stiffness of left shoulder, not elsewhere classified: Secondary | ICD-10-CM

## 2021-12-02 DIAGNOSIS — M25512 Pain in left shoulder: Secondary | ICD-10-CM

## 2021-12-02 NOTE — Therapy (Signed)
OUTPATIENT PHYSICAL THERAPY TREATMENT NOTE   Patient Name: Ryan Rojas MRN: [id number] DOB:1965/06/02, 56 y.o., male Today's Date: 12/02/2021  PCP: Claretta Fraise, MD  REFERRING PROVIDER: Leandrew Koyanagi, MD   PT End of Session - 12/02/21 1607     Visit Number 25    Number of Visits 30    Date for PT Re-Evaluation 12/09/21    PT Start Time 1600    PT Stop Time 1640    PT Time Calculation (min) 40 min    Activity Tolerance Patient tolerated treatment well    Behavior During Therapy [facility] for tasks assessed/performed                Past Medical History:  Diagnosis Date   Back pain    Hyperlipidemia    Past Surgical History:  Procedure Laterality Date   ROTATOR CUFF REPAIR Left 07/10/2021   Patient Active Problem List   Diagnosis Date Noted   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   Opiate analgesic use agreement exists 11/05/2021   Gasping for breath 11/05/2021   At risk for central sleep apnea 11/05/2021   Risk factors for obstructive sleep apnea 11/05/2021   Sleep related headaches 11/05/2021   Right hand paresthesia 09/11/2021   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   Obstructive sleep apnea 09/11/2021   Nontraumatic complete tear of left rotator cuff 05/28/2021   Impingement syndrome of left shoulder 05/28/2021   Tendinopathy of left biceps tendon 05/28/2021   Arm paresthesia, left 01/02/2021   Chronic right shoulder pain 01/02/2021   Other fatigue 01/02/2021   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   Neuropathic pain 09/30/2020   Left arm weakness 09/17/2020   Left arm pain 09/17/2020   Neck pain on left side 09/17/2020   Cubital tunnel syndrome on left 08/16/2020   Intractable neuropathic pain of lumbosacral origin 05/20/2020   Combined arterial insufficiency and corporo-venous occlusive erectile dysfunction 09/27/2019   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   Impotence 01/18/2018   Hyperlipidemia 07/16/2017    REFERRING DIAG: Nontraumatic  complete tear of left rotator cuff  THERAPY DIAG:  Acute pain of left shoulder  Stiffness of left shoulder, not elsewhere classified  Rationale for Evaluation and Treatment Rehabilitation  PERTINENT HISTORY: cannot grip with 4th and 5th fingers of the left hand  PRECAUTIONS: Left rotator cuff repair   SUBJECTIVE: Patient reports that his shoulder feels alright today.   PAIN:  Are you having pain? No     TODAY'S TREATMENT:                                     9/19 EXERCISE LOG  Exercise Repetitions and Resistance Comments  UBE  X 10 minutes @ 60 RPM    Resisted IR Green t-band x 2 minutes   Resisted extension Green t-band x 35 reps   Resisted ABD  Green t-band x 20 reps   Resisted shoulder flexion Green t-band x 30 reps To 30 degrees of flexion  Resisted shoulder ADD Orange XTS x 40 reps   Cane press up 13# x 40 reps   Supine cane flexion 3# x 40 reps   Supine arm circles  3# x 20 reps each (CW and CCW)   Supine horizontal ABD  Green t-band x 30 reps    Blank cell = exercise not performed today  9/7 EXERCISE LOG  Exercise Repetitions and Resistance Comments  UBE  X 10 minutes @ 60 RPM    Shoulder extension Green t-band with slight ABD; 3 minutes   Shoulder flexion  Green t-band; 3 minutes Standing to 45 degrees  Supine shoulder flexion 25 reps x 2 lbs To 90 degrees  Supine ball circles 3 minutes w/ 2 pound ball   Supine cane press up 2 minutes w/ 5 pound weight   Side lying shoulder ABD  30 reps w/ 2 pound weight   Side lying shoulder ER 30 reps w/ 2 pound weight   Cane bicep curl 30 reps w/ 7.5 pounds weight   Shoulder flexion stretch  3 x 30 seconds    Blank cell = exercise not performed today    11-13-21 LT UE UBE x 10 mins 60 RPMs Supine 2# flexion 2x10 from 90 degrees to end-range for Lat stretch, 2# flexion 3x10 supine full ROM  Manual:  End-range flexion isometrics with Pt supine 3x6 hold 6 secs  and the same for ER at  90/90 isometrics 3x6 hold 6 secs at end-range PROM for flexion and ER Rhythmic stabilization supine LT at many angles above 90 and at end-range degrees elevation also with ER          PT Short Term Goals - 08/27/21 1104       PT SHORT TERM GOAL #1   Title Patient will be able to achieve at least 120 degrees of passive left shoulder flexion for improved shoulder moiblity.    Time 6    Period Weeks    Status Achieved   Target Date 08/29/21      PT SHORT TERM GOAL #2   Title Patient will be able to achieve at least 120 degrees of passive left shoulder abduction for improved shoulder moiblity.    Time 6    Period Weeks    Status Achieved   Target Date 08/29/21              PT Long Term Goals - 08/27/21 1104       PT LONG TERM GOAL #1   Title Patient will be independent with his HEP.    Time 10    Period Weeks    Status  Achieved   Target Date 09/26/21      PT LONG TERM GOAL #2   Title Patient will be able to demonstrate at least 120 degrees of active left shoulder flexion for improved function with overhead activities.    Time 10    Period Weeks    Status Partially Met (uses other arm to achieve)   Target Date 09/26/21      PT LONG TERM GOAL #3   Title Patient will be able to demonstrate at least 120 degrees of active left shoulder abduction for improved function reaching overhead.    Time 10    Period Weeks    Status On-going (10/16/21: 100 degrees)   Target Date 09/26/21      PT LONG TERM GOAL #4   Title Patient will be able to lift at least 10 pounds overhead for improved function with household activities.    Time 10    Period Weeks    Status On-going    Target Date 09/26/01              Plan - 11/13/21 0820     Clinical Impression Statement Patient was progressed with multiple new and familiar interventions for improved shoulder  mobility and strength needed for improved function with his daily activities. He required minimal cueing with resisted  shoulder external rotation to prevent shoulder abduction to isolate the infraspinatus. Fatigue and cramping in the fingers of his right hand were his primary limiting factors as he experienced no pain or discomfort with any of today's interventions. He reported that his shoulder felt tired upon the conclusion of treatment. He continues to require skilled physical therapy to address his remaining impairments to maximize his functional mobility.    Personal Factors and Comorbidities Other;Profession    Examination-Activity Limitations Reach Overhead;Bathing;Self Feeding;Carry;Sleep;Dressing;Hygiene/Grooming;Lift    Examination-Participation Restrictions Occupation;Cleaning;Community Activity;Yard Work    Stability/Clinical Decision Making Stable/Uncomplicated    Rehab Potential Good    PT Frequency 2x / week    PT Duration Other (comment)    PT Treatment/Interventions ADLs/Self Care Home Management;Cryotherapy;Electrical Stimulation;Moist Heat;Neuromuscular re-education;Therapeutic exercise;Therapeutic activities;Functional mobility training;Patient/family education;Manual techniques;Passive range of motion;Taping;Vasopneumatic Device    PT Next Visit Plan PROM, scapular retraction, pendulums, and modalities as needed   post-op11 weeks 09-25-21   Consulted and Agree with Plan of Care Patient              Darlin Coco, PT 12/02/2021, 5:03 PM

## 2021-12-03 ENCOUNTER — Ambulatory Visit: Payer: 59 | Admitting: Family Medicine

## 2021-12-03 NOTE — Telephone Encounter (Signed)
[employer]- [facility] Josem Kaufmann: [id number] (exp. 11/25/21 to 05/25/22).  Patient is scheduled at [facility] for 01/01/22 at 8 pm.  Mailed packet to the patient.

## 2021-12-04 ENCOUNTER — Ambulatory Visit: Payer: 59 | Admitting: *Deleted

## 2021-12-04 ENCOUNTER — Encounter: Payer: Self-pay | Admitting: *Deleted

## 2021-12-04 DIAGNOSIS — M25512 Pain in left shoulder: Secondary | ICD-10-CM

## 2021-12-04 DIAGNOSIS — M25612 Stiffness of left shoulder, not elsewhere classified: Secondary | ICD-10-CM

## 2021-12-04 NOTE — Therapy (Addendum)
OUTPATIENT PHYSICAL THERAPY TREATMENT NOTE   Patient Name: Ryan Rojas MRN: [id number] DOB:May 05, 1965, 56 y.o., male Today's Date: 12/04/2021  PCP: Claretta Fraise, MD  REFERRING PROVIDER: Leandrew Koyanagi, MD   PT End of Session - 12/04/21 1644     Visit Number 26    Number of Visits 30    Date for PT Re-Evaluation 12/09/21    PT Start Time 1600    PT Stop Time 1645    PT Time Calculation (min) 45 min                 Past Medical History:  Diagnosis Date   Back pain    Hyperlipidemia    Past Surgical History:  Procedure Laterality Date   ROTATOR CUFF REPAIR Left 07/10/2021   Patient Active Problem List   Diagnosis Date Noted   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   Opiate analgesic use agreement exists 11/05/2021   Gasping for breath 11/05/2021   At risk for central sleep apnea 11/05/2021   Risk factors for obstructive sleep apnea 11/05/2021   Sleep related headaches 11/05/2021   Right hand paresthesia 09/11/2021   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   Obstructive sleep apnea 09/11/2021   Nontraumatic complete tear of left rotator cuff 05/28/2021   Impingement syndrome of left shoulder 05/28/2021   Tendinopathy of left biceps tendon 05/28/2021   Arm paresthesia, left 01/02/2021   Chronic right shoulder pain 01/02/2021   Other fatigue 01/02/2021   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   Neuropathic pain 09/30/2020   Left arm weakness 09/17/2020   Left arm pain 09/17/2020   Neck pain on left side 09/17/2020   Cubital tunnel syndrome on left 08/16/2020   Intractable neuropathic pain of lumbosacral origin 05/20/2020   Combined arterial insufficiency and corporo-venous occlusive erectile dysfunction 09/27/2019   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   Impotence 01/18/2018   Hyperlipidemia 07/16/2017    REFERRING DIAG: Nontraumatic complete tear of left rotator cuff  THERAPY DIAG:  Acute pain of left shoulder  Stiffness of left shoulder, not  elsewhere classified  Rationale for Evaluation and Treatment Rehabilitation  PERTINENT HISTORY: cannot grip with 4th and 5th fingers of the left hand  PRECAUTIONS: Left rotator cuff repair   SUBJECTIVE: Patient reports that his shoulder feels alright today. Minimal pain now  PAIN:  Are you having pain? No     TODAY'S TREATMENT:                                     9/21 EXERCISE LOG  Exercise Repetitions and Resistance Comments  UBE  X 10 minutes @ 60 RPM    HABD  3xfatigue   Resisted IR Green t-band x 2 minutes   Resisted extension Green t-band x 35 reps   Resisted ABD  Green t-band x 20 reps   Resisted shoulder flexion Green t-band x 30 reps To 30 degrees of flexion  Resisted shoulder ADD Orange XTS x 40 reps   ER  green tband X fatigue   press up 2# x 20 reps   Supine  flexion 2# x 20 reps   Supine arm circles  2#  3x 20 reps each (CW and CCW)   Supine horizontal ABD  Green t-band x 30 reps    Blank cell = exercise not performed today  Manual  PROM 130 degrees flexion and 50 degrees ER. Rhythmic stab at 120 degrees  flexion x 3 sets.                                    9/7 EXERCISE LOG  Exercise Repetitions and Resistance Comments  UBE  X 10 minutes @ 60 RPM    Shoulder extension Green t-band with slight ABD; 3 minutes   Shoulder flexion  Green t-band; 3 minutes Standing to 45 degrees  Supine shoulder flexion 25 reps x 2 lbs To 90 degrees  Supine ball circles 3 minutes w/ 2 pound ball   Supine cane press up 2 minutes w/ 5 pound weight   Side lying shoulder ABD  30 reps w/ 2 pound weight   Side lying shoulder ER 30 reps w/ 2 pound weight   Cane bicep curl 30 reps w/ 7.5 pounds weight   Shoulder flexion stretch  3 x 30 seconds    Blank cell = exercise not performed today      11-13-21 LT UE UBE x 10 mins 60 RPMs Supine 2# flexion 2x10 from 90 degrees to end-range for Lat stretch, 2# flexion 3x10 supine full ROM  Manual:  End-range flexion isometrics with Pt  supine 3x6 hold 6 secs  and the same for ER at 90/90 isometrics 3x6 hold 6 secs at end-range PROM for flexion and ER Rhythmic stabilization supine LT at many angles above 90 and at end-range degrees elevation also with ER          PT Short Term Goals - 08/27/21 1104       PT SHORT TERM GOAL #1   Title Patient will be able to achieve at least 120 degrees of passive left shoulder flexion for improved shoulder moiblity.    Time 6    Period Weeks    Status Achieved   Target Date 08/29/21      PT SHORT TERM GOAL #2   Title Patient will be able to achieve at least 120 degrees of passive left shoulder abduction for improved shoulder moiblity.    Time 6    Period Weeks    Status Achieved   Target Date 08/29/21              PT Long Term Goals - 08/27/21 1104       PT LONG TERM GOAL #1   Title Patient will be independent with his HEP.    Time 10    Period Weeks    Status  Achieved   Target Date 09/26/21      PT LONG TERM GOAL #2   Title Patient will be able to demonstrate at least 120 degrees of active left shoulder flexion for improved function with overhead activities.    Time 10    Period Weeks    Status MET   Target Date 09/26/21      PT LONG TERM GOAL #3   Title Patient will be able to demonstrate at least 120 degrees of active left shoulder abduction for improved function reaching overhead.    Time 10    Period Weeks    Status Not MET (10/16/21: 100 degrees)   Target Date 09/26/21      PT LONG TERM GOAL #4   Title Patient will be able to lift at least 10 pounds overhead for improved function with household activities.    Time 10    Period Weeks    Status Not MET   Target Date 09/26/01  Plan - 11/13/21 0820     Clinical Impression Statement Patient arrived today doing fairly well and reports he will f/u with MD next week for possible DC. He was able to perform all resisted exs today as well as manual techniques and did well. He has improved  with AROM/PROM and was able to meet some LTGs. Active flexion 120 degrees and ER 50 degrees.Other LTG's NM were due to lack of ABD ROM and strength deficit. Pt independent in current HEP   Personal Factors and Comorbidities Other;Profession    Examination-Activity Limitations Reach Overhead;Bathing;Self Feeding;Carry;Sleep;Dressing;Hygiene/Grooming;Lift    Examination-Participation Restrictions Occupation;Cleaning;Community Activity;Yard Work    Stability/Clinical Decision Making Stable/Uncomplicated    Rehab Potential Good    PT Frequency 2x / week    PT Duration Other (comment)    PT Treatment/Interventions ADLs/Self Care Home Management;Cryotherapy;Electrical Stimulation;Moist Heat;Neuromuscular re-education;Therapeutic exercise;Therapeutic activities;Functional mobility training;Patient/family education;Manual techniques;Passive range of motion;Taping;Vasopneumatic Device    PT Next Visit Plan  MD note   Consulted and Agree with Plan of Care Patient              Lauramae Kneisley,CHRIS, PTA 12/04/2021, 4:56 PM  Progress Note Reporting Period 10/16/21 to 12/04/21  See note below for Objective Data and Assessment of Progress/Goals.    Patient has made good progress with skilled physical therapy as he was able to meet his goal for active shoulder flexion. He continues to exhibit reduced shoulder abduction and external rotation due to left shoulder stiffness. Recommend that he be discharged to a HEP, as able, at the conclusion of his plan of care.   Jacqulynn Cadet, PT, DPT   PHYSICAL THERAPY DISCHARGE SUMMARY  Visits from Start of Care: 26  Current functional level related to goals / functional outcomes: See progress note   Remaining deficits: AROM    Education / Equipment: HEP    Patient agrees to discharge. Patient goals were partially met. Patient is being discharged due to not returning since the last visit.  Jacqulynn Cadet, PT, DPT

## 2021-12-09 ENCOUNTER — Encounter: Payer: Self-pay | Admitting: Orthopaedic Surgery

## 2021-12-09 ENCOUNTER — Ambulatory Visit (INDEPENDENT_AMBULATORY_CARE_PROVIDER_SITE_OTHER): Payer: 59 | Admitting: Neurology

## 2021-12-09 ENCOUNTER — Ambulatory Visit (INDEPENDENT_AMBULATORY_CARE_PROVIDER_SITE_OTHER): Payer: 59 | Admitting: Orthopaedic Surgery

## 2021-12-09 ENCOUNTER — Encounter: Payer: Self-pay | Admitting: Neurology

## 2021-12-09 VITALS — BP 132/86 | HR 66 | Ht 72.0 in | Wt 239.2 lb

## 2021-12-09 DIAGNOSIS — Z9889 Other specified postprocedural states: Secondary | ICD-10-CM | POA: Diagnosis not present

## 2021-12-09 DIAGNOSIS — M5441 Lumbago with sciatica, right side: Secondary | ICD-10-CM

## 2021-12-09 DIAGNOSIS — G5601 Carpal tunnel syndrome, right upper limb: Secondary | ICD-10-CM | POA: Diagnosis not present

## 2021-12-09 DIAGNOSIS — R202 Paresthesia of skin: Secondary | ICD-10-CM | POA: Insufficient documentation

## 2021-12-09 DIAGNOSIS — M75111 Incomplete rotator cuff tear or rupture of right shoulder, not specified as traumatic: Secondary | ICD-10-CM | POA: Diagnosis not present

## 2021-12-09 DIAGNOSIS — G8929 Other chronic pain: Secondary | ICD-10-CM | POA: Insufficient documentation

## 2021-12-09 HISTORY — DX: Other specified postprocedural states: Z98.890

## 2021-12-09 MED ORDER — GABAPENTIN 100 MG PO CAPS: 600.0000 mg | ORAL_CAPSULE | Freq: Two times a day (BID) | ORAL | 11 refills | Status: DC | PRN

## 2021-12-09 NOTE — Progress Notes (Signed)
ASSESSMENT AND PLAN  Ryan Rojas is a 56 y.o. male   Left lower cervical brachial plexopathy Presented with subacute onset left ulnar arm, left chest pain, followed by weakness since early June 2022  In the dermatome of left C8, T1,  EMG nerve conduction study in August 2022 demonstrate left lower brachial plexopathy, involving left C8-T1 mild involvement of C7  MRI left shoulder in July 2022 showed full-thickness incomplete tearing of the anterior supraspinatus tendon, which would not explain his current difficulty  MRI of cervical also demonstrated C5-6, C6-7 degenerative disease, with moderate canal stenosis  Overall improved, but unfortunately complicated by his left shoulder rotator cuff disease, with laparoscopic procedure in March 2023,   EMG nerve conduction study today showed continued evidence of left brachial plexopathy, mainly involving left lower trunk, C8, T1 more than C7, compared to previous study in August 2022, he has mild improvement,  Right hand paresthesia, right shoulder pain,  EMG nerve conduction study in August 2023 showed moderate right carpal tunnel syndromes, more right C7 radiculopathy  Personally reviewed MRI of cervical spine in November 2022, congenital narrowing canal, multilevel degenerative changes, most noticeable at C5-6, moderate bilateral foraminal narrowing, C6-7 also showed moderate left-sided foraminal narrowing  Frequent right hand and ankle muscle spasm, worsening low back pain, radiating pain to right lower extremity  Added gabapentin titrating to 600 mg every night as needed  MRI of lumbar spine to rule out lumbar radiculopathy  The puzzling feature is that he always have concurrent right hand and right ankle painful muscle spasm, MRI of the brain to rule out left hemisphere pathology     DIAGNOSTIC DATA (LABS, IMAGING, TESTING) - I reviewed patient records, labs, notes, testing and imaging myself where available.  Laboratory evaluation  on September 05, 2020, normal CMP, creatinine one 0.4, negative troponin, normal CBC, hemoglobin of 14.6,  HISTORICAL  Ryan Rojas is a 56 year old left handed male, seen in request by orthopedic surgeon Dr. Roda Shutters, [place] and his primary care physician Stacks, Broadus John, for evaluation of subacute onset left arm pain, left anterior chest muscle pain, initial evaluation was on September 17, 2020.  I reviewed and summarized the referring note. PMHX HLD,  Low back pain,   He works as Armed forces technical officer, spent most of the time writing, working on [employer]im[employer], he woke up 1 day in the middle of May 2022, noticed numbness tingling painful sensation at the left ulnar forearm, extending to left fourth and the fifth fingers,  His symptoms continue to progress in the following days, later on the neuropathic pain spread to left arm involving left armpit, left anterior chest, he described significant sharp shooting pain, worry about the heart attack, he presented to local emergency room, there was no cardiac etiology found to explain his left chest inner arm discomfort  About a week later, he began to noticed left hand weakness, difficulty to extend his left lateral to 3 fingers, weak grip,  Over the past couple months, his significant pain has much improved, but he still has intermittent flareup of the pain, especially when he coughs, moves suddenly, he would have such radiating pain at the left armpit to his left anterior chest, left ulnar arm and forearm, he continues to have weakness of left hands, mild improvement with Lyrica 200 mg daily  He denies gait abnormality, he denies bowel bladder incontinence, he denies right upper extremity involvement  Electrodiagnostic study in August 2022 confirmed diagnosis of left lower brachial plexopathy, mainly involving C8-T1,  with minor involvement of C7,  He overall has not improved with, unfortunately he began to experience increased left and right shoulder pain,  MRI of  left shoulder showed full-thickness incomplete tearing of the anterior supraspinatus tendon insertion with up to 7 mm of tendon retraction, long head biceps tendon is medially perched at the group entry zone with focal tendinosis,  He was seen by orthopedic surgeon Dr. Erlinda Hong, nontraumatic complete tear of left rotator cuff, underwent laparoscopic surgery in March 2023, followed by physical therapy, with some improvement range of motion of left shoulder, continue to have left hand numbness, weakness  Now he complains of increased right shoulder pain, MRI of right shoulder in November 2022 showed moderate tendinosis of the supraspinatus tendon with a small partial-thickness articular surface tear, mild tendinosis of the infraspinatus tendon, partial-thickness tear of the peripheral fibers of the subscapularis tendon, partial-thickness cartilage loss of the glenohumeral joint with area of high-grade partial-thickness cartilage loss of the inferior glenoid with subchondral reactive marrow change  More right shoulder orthopedic intervention is pending, since April 2023, he noticed intermittent episode of right hand numbness tingling, mainly involving 3rd-5th finger, no persistent sensory loss, no weakness  Also complains of increased left knee pain, no bowel or bladder incontinence  We personally reviewed MRI of cervical spine November 2022, multilevel degenerative changes, prominent disc osteophyte diffuse bulging at C5-6 with moderate by foraminal stenosis, C6-7 also showed moderate left foraminal stenosis, mild canal stenosis no spinal cord compression  In addition, he complains of poor sleep quality, frequent snoring, wake up in the middle of the night, sometimes to the point of difficulty catching his breath To use inhaler, excessive daytime sleepiness, fatigue  UPDate October 29, 2021: He continues to have significant limited range of motion of left shoulder despite arthroscopic surgery for left rotator  cuff in March 2023, right shoulder pain and right hand symptoms has improved with right shoulder injection recently  He return for EMG nerve conduction study today, continue evidence of chronic neuropathic changes involving left C8, T1 more than C7 myotomes, consistent with previous diagnosis of left brachial plexopathy, mainly involving left lower trunk, compared to previous study in August 2022, noticed moderate improvement as evident by much improved left ulnar motor CMAP amplitude  UPDATE Sept 26 2023: He came in earlier than expected frustrating about his frequent right hand muscle cramping, only happening at nighttime, 3 times a week over the past few months, when he has right hand muscle cramping, he also noticed right ankle discomfort, mild gait abnormality due to left knee pain, wear knee brace, long history of chronic low back pain, worsening low back pain over the past couple years, radiating pain to right lower extremity, denies bowel and bladder incontinence.  He is taking Cymbalta 60 mg every morning, seems to help him some,  PHYSICAL EXAM:    Vitals:   12/09/21 1043  Weight: 239 lb 4 oz (108.5 kg)  Height: 6' (1.829 m)    Gen: NAD, conversant, well nourised, well groomed          NEUROLOGICAL EXAM:   MENTAL STATUS: Speech/cognition: Awake, alert, oriented to history taking and casual conversation   CRANIAL NERVES: CN II: Visual fields are full to confrontation. Pupils are round equal and briskly reactive to light. CN III, IV, VI: extraocular movement are normal. No ptosis. CN V: Facial sensation is intact to light touch CN VII: Face is symmetric with normal eye closure  CN VIII: Hearing is normal to  causal conversation. CN IX, X: Phonation is normal. CN XI: Head turning and shoulder shrug are intact CN XII: Narrow oropharyngeal space  MOTOR: Limited range of motion of left shoulder, maximum left shoulder extension 90 degree, with apparent pain with left shoulder  movement, no significant left proximal muscle weakness, moderate left hand intrinsic muscle atrophy, mild left wrist extension 5-/flexion weakness 4, moderate finger extension weakness 4 -, finger flexion weakness, especially left ulnar 2 fingers,  Right upper extremity muscles bilateral lower extremity muscles were within normal limit   REFLEXES: Reflexes are1 and symmetric at the biceps, triceps, knees, and ankles. Plantar responses are flexor.  SENSORY: Decreased light touch at dorsum of left hand, involving left hand fingertips  COORDINATION: There is no trunk or limb dysmetria noted.  GAIT/STANCE: He needs push-up to get up from seated position, mildly antalgic  REVIEW OF SYSTEMS:  Full 14 system review of systems performed and notable only for as above All other review of systems were negative.   ALLERGIES: Allergies  Allergen Reactions   Hydrocodone Itching   Oxycodone Hives    HOME MEDICATIONS: Current Outpatient Medications  Medication Sig Dispense Refill   atorvastatin (LIPITOR) 40 MG tablet Take 1 tablet (40 mg total) by mouth daily. 90 tablet 3   celecoxib (CELEBREX) 100 MG capsule Take 1 capsule (100 mg total) by mouth 2 (two) times daily as needed. 60 capsule 3   diphenhydrAMINE HCl (BENADRYL ALLERGY PO) Take by mouth as needed.     DULoxetine (CYMBALTA) 60 MG capsule TAKE 1 CAPSULE BY MOUTH EVERY DAY 90 capsule 1   HYDROmorphone (DILAUDID) 2 MG tablet Take 1 tablet (2 mg total) by mouth at bedtime as needed for severe pain. 20 tablet 0   sildenafil (VIAGRA) 100 MG tablet TAKE 0.5-1 TABLETS BY MOUTH DAILY AS NEEDED FOR ERECTILE DYSFUNCTION. 6 tablet 7   No current facility-administered medications for this visit.    PAST MEDICAL HISTORY: Past Medical History:  Diagnosis Date   Back pain    Hyperlipidemia     PAST SURGICAL HISTORY: Past Surgical History:  Procedure Laterality Date   ROTATOR CUFF REPAIR Left 07/10/2021    FAMILY HISTORY: Family History   Problem Relation Age of Onset   Asthma Mother    Diabetes Sister    Asthma Sister     SOCIAL HISTORY: Social History   Socioeconomic History   Marital status: Single    Spouse name: Not on file   Number of children: 3   Years of education: Not on file   Highest education level: High school graduate  Occupational History   Not on file  Tobacco Use   Smoking status: Never   Smokeless tobacco: Never  Vaping Use   Vaping Use: Never used  Substance and Sexual Activity   Alcohol use: Yes    Alcohol/week: 1.0 standard drink of alcohol    Types: 1 Cans of beer per week   Drug use: No   Sexual activity: Yes    Birth control/protection: None  Other Topics Concern   Not on file  Social History Narrative   Lives alone   L handed   Caffeine: 1 C of coffee every AM   Social Determinants of Health   Financial Resource Strain: Not on file  Food Insecurity: Not on file  Transportation Needs: Not on file  Physical Activity: Not on file  Stress: Not on file  Social Connections: Not on file  Intimate Partner Violence: Not on file  Levert Feinstein, M.D. Ph.D.  [facility] [address], [address] [place], [place] [address] Ph: [phone] Fax: [phone]  CC:  Mechele Claude, MD [address] [place],  [place] [address]  Mechele Claude, MD

## 2021-12-09 NOTE — Procedures (Signed)
Full Name: Ryan Rojas Gender: Male MRN #: [id number] Date of Birth: 01/29/1966    Visit Date: 10/29/2021 09:02 Age: 56 Years Examining Physician: Levert Feinstein Referring Physician: Levert Feinstein Height: 6 feet 0 inch History: 56 year old male with frequent left hand paresthesia, muscle cramping, history of left lower brachial plexopathy  Summary of the test:  Nerve conduction study:  Median sensory response showed mildly prolonged peak latency, with mildly decreased snap amplitude on the left side.  Right ulnar sensory response was normal, left ulnar sensory response showed mildly increased peak latency, with snap amplitude.  Bilateral ulnar motor responses were normal.  Bilateral medial motor responses showed mildly prolonged distal latency, within normal range CMAP amplitude, conduction velocity   Electromyography: Selected needle examinations were performed at bilateral upper extremity muscles and cervical paraspinal muscles. There continued evidence of significant chronic neuropathic changes involving left C8-T1 myotomes.  There are only chronic mild neuropathic changes, noticeable at right abductor pollicis brevis, and distal upper extremity muscles.  There is no spontaneous activity at bilateral cervical paraspinal muscles.   Conclusion: This is an abnormal study.  There is continued evidence of chronic left lower brachial plexopathy, mainly involving left C8-T1 myotomes.  There is also evidence of moderate right carpal tunnel syndromes, mainly demyelinating in nature, no evidence of axonal loss.  There is also evidence of mild chronic right C7 radiculopathy.    ------------------------------- Levert Feinstein, M.D. Ph.D.  [facility] [address], [address] [place], [place] [address] Tel: [phone] Fax: [phone]  Verbal informed consent was obtained from the patient, patient was informed of potential risk of procedure, including bruising, bleeding,  hematoma formation, infection, muscle weakness, muscle pain, numbness, among others.        MNC    Nerve / Sites Muscle Latency Ref. Amplitude Ref. Rel Amp Segments Distance Velocity Ref. Area    ms ms mV mV %  cm m/s m/s mVms  R Median - APB     Wrist APB 4.9 ?4.4 5.0 ?4.0 100 Wrist - APB 7   17.0     Upper arm APB 10.0  4.9  97.6 Upper arm - Wrist 26.5 52 ?49 20.3  L Median - APB     Wrist APB 4.9 ?4.4 4.0 ?4.0 100 Wrist - APB 7   9.2     Upper arm APB 10.2  4.0  101 Upper arm - Wrist 29 55 ?49 9.1  R Ulnar - ADM     Wrist ADM 3.1 ?3.3 8.2 ?6.0 100 Wrist - ADM 7   23.1     B.Elbow ADM 7.0  8.0  97.1 B.Elbow - Wrist 20 52 ?49 23.0     A.Elbow ADM 10.1  7.0  88 A.Elbow - B.Elbow 16 51 ?49 18.6  L Ulnar - ADM     Wrist ADM 3.5 ?3.3 5.4 ?6.0 100 Wrist - ADM 7   15.3     B.Elbow ADM 7.8  5.5  102 B.Elbow - Wrist 24 57 ?49 14.0     A.Elbow ADM 10.1  5.5  100 A.Elbow - B.Elbow 13 55 ?49 15.1             SNC    Nerve / Sites Rec. Site Peak Lat Ref.  Amp Ref. Segments Distance    ms ms V V  cm  L Radial - Anatomical snuff box (Forearm)     Forearm Wrist 2.6 ?2.9 17 ?15 Forearm - Wrist 10  R Median - Orthodromic (Dig II, Mid palm)     Dig II Wrist 3.8 ?3.4 10 ?10 Dig II - Wrist 15  L Median - Orthodromic (Dig II, Mid palm)     Dig II Wrist 3.9 ?3.4 7 ?10 Dig II - Wrist 15  R Ulnar - Orthodromic, (Dig V, Mid palm)     Dig V Wrist 3.0 ?3.1 6 ?5 Dig V - Wrist 13  L Ulnar - Orthodromic, (Dig V, Mid palm)     Dig V Wrist 3.5 ?3.1 8 ?5 Dig V - Wrist 75               F  Wave    Nerve F Lat Ref.   ms ms  R Ulnar - ADM 34.4 ?32.0  L Ulnar - ADM 35.1 ?32.0         EMG Summary Table    Spontaneous MUAP Recruitment  Muscle IA Fib PSW Fasc Other Amp Dur. Poly Pattern  L. First dorsal interosseous Increased 2+ 2+ None _______ Normal Normal Normal Discrete  L. Pronator teres Increased 1+ None None _______ Normal Normal Normal Reduced  L. Brachioradialis Normal None None None _______  Normal Normal Normal Normal  L. Biceps brachii Normal None None None _______ Normal Normal Normal Normal  L. Deltoid Normal None None None _______ Normal Normal Normal Normal  L. Extensor digitorum communis Increased 1+ None None _______ Increased Increased 1+ Reduced  L. Abductor pollicis brevis Increased 1+ None None _______ Normal Normal Normal Reduced  L. Flexor digitorum profundus (Ulnar) Increased 1+ None None _______ Normal Normal Normal Reduced  R. First dorsal interosseous Normal None None None _______ Normal Normal Normal Normal  R. Abductor pollicis brevis Normal None None None _______ Normal Normal Normal Reduced  R. Pronator teres Normal None None None _______ Normal Normal Normal Normal  R. Brachioradialis Normal None None None _______ Normal Normal Normal Normal  R. Extensor digitorum communis Increased None None None _______ Normal Normal Normal Normal  R. Triceps brachii Increased None None None _______ Normal Normal Normal Reduced  R. Biceps brachii Normal None None None _______ Normal Normal Normal Normal  R. Deltoid Normal None None None _______ Normal Normal Normal Normal  R. Cervical paraspinals Normal None None None _______ Normal Normal Normal Normal  L. Cervical paraspinals Normal None None None _______ Normal Normal Normal Normal

## 2021-12-09 NOTE — Progress Notes (Signed)
Post-Op Visit Note   Patient: Ryan Rojas           Date of Birth: 1965/08/09           MRN: [id number] Visit Date: 12/09/2021 PCP: Mechele Claude, MD   Assessment & Plan:  Chief Complaint:  Chief Complaint  Patient presents with   Left Shoulder - Follow-up    Left shoulder scope 07/03/2021   Visit Diagnoses:  1. S/P left rotator cuff repair   2. Nontraumatic incomplete tear of right rotator cuff     Plan: Zayde is 5 months status post left rotator cuff repair biceps tenodesis.  He finished physical therapy last week.  He reports no pain.  He still having problems with strength.  His shoulder is functional overall.  He has been back at work doing light duty.  He is doing home exercises at this time.  Examination of the left shoulder shows full healed surgical scars.  He has weakness to forward flexion and abduction.  Passive range of motion is preserved.  Manual muscle testing of the rotator cuff shows 4 out of 5 strength.  He does not have a Popeye deformity.  Overall patient is pleased with the outcome.  He will continue to do his own exercises to improve strength.  He is happy that he does not have pain anymore.  From my standpoint we can have him follow-up with [place] as needed.  Questions encouraged and answered.  Follow-Up Instructions: No follow-ups on file.   Orders:  No orders of the defined types were placed in this encounter.  No orders of the defined types were placed in this encounter.   Imaging: No results found.  PMFS History: Patient Active Problem List   Diagnosis Date Noted   Carpal tunnel syndrome of right wrist 12/09/2021   Chronic midline low back pain with right-sided sciatica 12/09/2021   Paresthesia 12/09/2021   S/P left rotator cuff repair 12/09/2021   Nontraumatic incomplete tear of right rotator cuff 12/09/2021   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   Opiate analgesic use agreement exists 11/05/2021   Gasping for breath 11/05/2021    At risk for central sleep apnea 11/05/2021   Risk factors for obstructive sleep apnea 11/05/2021   Sleep related headaches 11/05/2021   Right hand paresthesia 09/11/2021   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   Obstructive sleep apnea 09/11/2021   Nontraumatic complete tear of left rotator cuff 05/28/2021   Impingement syndrome of left shoulder 05/28/2021   Tendinopathy of left biceps tendon 05/28/2021   Arm paresthesia, left 01/02/2021   Chronic right shoulder pain 01/02/2021   Other fatigue 01/02/2021   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   Neuropathic pain 09/30/2020   Left arm weakness 09/17/2020   Left arm pain 09/17/2020   Neck pain on left side 09/17/2020   Cubital tunnel syndrome on left 08/16/2020   Intractable neuropathic pain of lumbosacral origin 05/20/2020   Combined arterial insufficiency and corporo-venous occlusive erectile dysfunction 09/27/2019   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   Impotence 01/18/2018   Hyperlipidemia 07/16/2017   Past Medical History:  Diagnosis Date   Back pain    Hyperlipidemia     Family History  Problem Relation Age of Onset   Asthma Mother    Diabetes Sister    Asthma Sister     Past Surgical History:  Procedure Laterality Date   ROTATOR CUFF REPAIR Left 07/10/2021   Social History   Occupational History   Not on file  Tobacco Use   Smoking status: Never   Smokeless tobacco: Never  Vaping Use   Vaping Use: Never used  Substance and Sexual Activity   Alcohol use: Yes    Alcohol/week: 1.0 standard drink of alcohol    Types: 1 Cans of beer per week   Drug use: No   Sexual activity: Yes    Birth control/protection: None

## 2021-12-10 ENCOUNTER — Telehealth: Payer: Self-pay | Admitting: Neurology

## 2021-12-10 NOTE — Telephone Encounter (Signed)
[employer] sent to [facility] they obtain auth 

## 2021-12-17 ENCOUNTER — Encounter: Payer: Self-pay | Admitting: Family Medicine

## 2021-12-17 ENCOUNTER — Ambulatory Visit (INDEPENDENT_AMBULATORY_CARE_PROVIDER_SITE_OTHER): Payer: 59 | Admitting: Family Medicine

## 2021-12-17 VITALS — BP 118/64 | HR 49 | Temp 97.7°F | Ht 72.0 in | Wt 243.0 lb

## 2021-12-17 DIAGNOSIS — Z1211 Encounter for screening for malignant neoplasm of colon: Secondary | ICD-10-CM

## 2021-12-17 NOTE — Progress Notes (Signed)
Subjective:  Patient ID: Ryan Rojas, male    DOB: 01-20-1966  Age: 56 y.o. MRN: [id number]  CC: Referral (colonoscopy)   HPI Cayton Dafoe presents for back pain makes it hard to stand. Right hand and ankle spasm at night. Has MRIs ordered.      12/17/2021    4:35 PM 04/09/2021    4:12 PM 10/07/2020    4:14 PM  Depression screen PHQ 2/9  Decreased Interest 0 0 0  Down, Depressed, Hopeless 0 0 0  PHQ - 2 Score 0 0 0  Altered sleeping 3    Tired, decreased energy 0    Change in appetite 0    Feeling bad or failure about yourself  0    Trouble concentrating 0    Moving slowly or fidgety/restless 3    Suicidal thoughts 0    PHQ-9 Score 6    Difficult doing work/chores Somewhat difficult      History Alexiz has a past medical history of Back pain and Hyperlipidemia.   He has a past surgical history that includes Rotator cuff repair (Left, 07/10/2021).   His family history includes Asthma in his mother and sister; Diabetes in his sister.He reports that he has never smoked. He has never used smokeless tobacco. He reports current alcohol use of about 1.0 standard drink of alcohol per week. He reports that he does not use drugs.    ROS Review of Systems  Constitutional:  Negative for fever.  Respiratory:  Negative for shortness of breath.   Cardiovascular:  Negative for chest pain.  Musculoskeletal:  Positive for back pain and gait problem. Negative for arthralgias.  Skin:  Negative for rash.    Objective:  BP 118/64   Pulse (!) 49   Temp 97.7 F (36.5 C)   Ht 6' (1.829 m)   Wt 243 lb (110.2 kg)   SpO2 95%   BMI 32.96 kg/m   BP Readings from Last 3 Encounters:  12/17/21 118/64  12/09/21 132/86  11/05/21 123/76    Wt Readings from Last 3 Encounters:  12/17/21 243 lb (110.2 kg)  12/09/21 239 lb 4 oz (108.5 kg)  11/05/21 243 lb (110.2 kg)     Physical Exam Vitals reviewed.  Constitutional:      Appearance: He is well-developed.  HENT:     Head:  Normocephalic and atraumatic.     Right Ear: External ear normal.     Left Ear: External ear normal.     Mouth/Throat:     Pharynx: No oropharyngeal exudate or posterior oropharyngeal erythema.  Eyes:     Pupils: Pupils are equal, round, and reactive to light.  Cardiovascular:     Rate and Rhythm: Normal rate and regular rhythm.     Heart sounds: No murmur heard. Pulmonary:     Effort: No respiratory distress.     Breath sounds: Normal breath sounds.  Musculoskeletal:     Cervical back: Normal range of motion and neck supple.  Neurological:     Mental Status: He is alert and oriented to person, place, and time.       Assessment & Plan:   Rieley was seen today for referral.  Diagnoses and all orders for this visit:  Screen for colon cancer -     Ambulatory referral to Gastroenterology       I have discontinued Thao Odem's HYDROmorphone. I am also having him maintain his atorvastatin, diphenhydrAMINE HCl (BENADRYL ALLERGY PO), celecoxib, DULoxetine, sildenafil, and gabapentin.  Allergies as of 12/17/2021       Reactions   Hydrocodone Itching   Oxycodone Hives        Medication List        Accurate as of December 17, 2021  6:32 PM. If you have any questions, ask your nurse or doctor.          STOP taking these medications    HYDROmorphone 2 MG tablet Commonly known as: Dilaudid Stopped by: Claretta Fraise, MD       TAKE these medications    atorvastatin 40 MG tablet Commonly known as: LIPITOR Take 1 tablet (40 mg total) by mouth daily.   BENADRYL ALLERGY PO Take by mouth as needed.   celecoxib 100 MG capsule Commonly known as: CeleBREX Take 1 capsule (100 mg total) by mouth 2 (two) times daily as needed.   DULoxetine 60 MG capsule Commonly known as: CYMBALTA TAKE 1 CAPSULE BY MOUTH EVERY DAY   gabapentin 100 MG capsule Commonly known as: Neurontin Take 6 capsules (600 mg total) by mouth 3 times/day as needed-between meals & bedtime.    sildenafil 100 MG tablet Commonly known as: VIAGRA TAKE 0.5-1 TABLETS BY MOUTH DAILY AS NEEDED FOR ERECTILE DYSFUNCTION.         Follow-up: Return in about 6 months (around 06/18/2022).  Claretta Fraise, M.D.

## 2021-12-19 ENCOUNTER — Telehealth: Payer: Self-pay | Admitting: Orthopaedic Surgery

## 2021-12-19 NOTE — Telephone Encounter (Signed)
Patient called and left message on voice mail asking if he can get started on the scheduling process for his RIGHT rotator cuff shoulder surgery.  Patient's contact is [phone]

## 2021-12-23 ENCOUNTER — Encounter: Payer: Self-pay | Admitting: Orthopaedic Surgery

## 2021-12-23 DIAGNOSIS — M67921 Unspecified disorder of synovium and tendon, right upper arm: Secondary | ICD-10-CM | POA: Insufficient documentation

## 2021-12-23 NOTE — Telephone Encounter (Signed)
On your desk

## 2021-12-27 ENCOUNTER — Other Ambulatory Visit: Payer: 59

## 2021-12-27 ENCOUNTER — Ambulatory Visit
Admission: RE | Admit: 2021-12-27 | Discharge: 2021-12-27 | Disposition: A | Discharge status: Home / Self Care | Payer: 59 | Source: Ambulatory Visit | Attending: Neurology | Admitting: Neurology

## 2021-12-27 DIAGNOSIS — G5601 Carpal tunnel syndrome, right upper limb: Secondary | ICD-10-CM

## 2021-12-27 DIAGNOSIS — R202 Paresthesia of skin: Secondary | ICD-10-CM

## 2021-12-27 DIAGNOSIS — G8929 Other chronic pain: Secondary | ICD-10-CM

## 2021-12-30 ENCOUNTER — Telehealth: Payer: Self-pay | Admitting: Neurology

## 2021-12-30 ENCOUNTER — Telehealth: Payer: Self-pay | Admitting: Family Medicine

## 2021-12-30 DIAGNOSIS — M545 Low back pain, unspecified: Secondary | ICD-10-CM

## 2021-12-30 DIAGNOSIS — R202 Paresthesia of skin: Secondary | ICD-10-CM

## 2021-12-30 NOTE — Telephone Encounter (Signed)
Per [facility], after clinicals were uploaded they are still requiring a peer to peer [facility] [phone] pending case #[id number]

## 2021-12-30 NOTE — Telephone Encounter (Signed)
Please call patient, MRI of the brain showed no significant abnormality, MRI of lumbar spine was denied by insurance company at this point,

## 2021-12-30 NOTE — Telephone Encounter (Signed)
Message sent to MRI intake for further review.

## 2021-12-30 NOTE — Telephone Encounter (Signed)
Pt is calling. Said GI cancel his lower back MRI stated they are waiting on more information from the doctor office. Pt said GI did the MRI on his brain. Pt is requesting a call back.

## 2021-12-30 NOTE — Telephone Encounter (Signed)
Pt called to get update on his referral to have a colonoscopy.   Please call patient back asap.

## 2021-12-31 ENCOUNTER — Encounter: Payer: Self-pay | Admitting: *Deleted

## 2021-12-31 DIAGNOSIS — M545 Low back pain, unspecified: Secondary | ICD-10-CM | POA: Insufficient documentation

## 2021-12-31 MED ORDER — MELOXICAM 15 MG PO TABS: 15.0000 mg | ORAL_TABLET | Freq: Every day | ORAL | 1 refills | Status: DC | PRN

## 2021-12-31 NOTE — Telephone Encounter (Signed)
Pt called. Stated he needs to talk to a nurse about what's going on with MRI. Pt is requesting a call back from nurse.

## 2021-12-31 NOTE — Telephone Encounter (Signed)
Orders Placed This Encounter  Procedures   DG Lumbar Spine 2-3 Views--order was sent to [facility] on [address]   Ambulatory referral to Pain Clinic--Dr. Ramos          Meds ordered this encounter  Medications   meloxicam (MOBIC) 15 MG tablet    Sig: Take 1 tablet (15 mg total) by mouth daily as needed for pain. After meal    Dispense:  30 tablet    Refill:  1

## 2021-12-31 NOTE — Telephone Encounter (Signed)
Please make sure pt. Knows he must return the paperwork before they will schedule him

## 2021-12-31 NOTE — Addendum Note (Signed)
Addended by: Marcial Pacas on: 12/31/2021 06:04 PM   Modules accepted: Orders

## 2021-12-31 NOTE — Telephone Encounter (Signed)
Message given to pt,  He would like to know what you recommend for back pain, asking for possible referral to pain management.

## 2021-12-31 NOTE — Telephone Encounter (Signed)
Patient aware.

## 2022-01-01 ENCOUNTER — Ambulatory Visit (INDEPENDENT_AMBULATORY_CARE_PROVIDER_SITE_OTHER): Payer: 59 | Admitting: Neurology

## 2022-01-01 ENCOUNTER — Telehealth: Payer: Self-pay | Admitting: Neurology

## 2022-01-01 DIAGNOSIS — Z9189 Other specified personal risk factors, not elsewhere classified: Secondary | ICD-10-CM

## 2022-01-01 DIAGNOSIS — G8929 Other chronic pain: Secondary | ICD-10-CM

## 2022-01-01 DIAGNOSIS — R0689 Other abnormalities of breathing: Secondary | ICD-10-CM

## 2022-01-01 DIAGNOSIS — Z79891 Long term (current) use of opiate analgesic: Secondary | ICD-10-CM

## 2022-01-01 DIAGNOSIS — G4733 Obstructive sleep apnea (adult) (pediatric): Secondary | ICD-10-CM | POA: Diagnosis not present

## 2022-01-01 NOTE — Telephone Encounter (Signed)
Spoke to pt, pt voiced understanding

## 2022-01-01 NOTE — Telephone Encounter (Signed)
Referral sent to Dr. Nelva Bush at [facility], phone # [phone].

## 2022-01-06 ENCOUNTER — Encounter: Payer: Self-pay | Admitting: *Deleted

## 2022-01-06 NOTE — Patient Instructions (Signed)
  Procedure: Colonoscopy  Estimated body mass index is 32.55 kg/m as calculated from the following:   Height as of this encounter: 6' (1.829 m).   Weight as of this encounter: 240 lb (108.9 kg).   Have you had a colonoscopy before?  no  Do you have family history of colon cancer  no  Do you have a family history of polyps? no  Previous colonoscopy with polyps removed? no  Do you have a history colorectal cancer?   no  Are you diabetic?  no  Do you have a prosthetic or mechanical heart valve? no  Do you have a pacemaker/defibrillator?   no  Have you had endocarditis/atrial fibrillation?  no  Do you use supplemental oxygen/CPAP?  no  Have you had joint replacement within the last 12 months?  no  Do you tend to be constipated or have to use laxatives?  no   Do you have history of alcohol use? If yes, how much and how often.  no  Do you have history or are you using drugs? If yes, what do are you  using?  no  Have you ever had a stroke/heart attack?  no  Have you ever had a heart or other vascular stent placed,?no  Do you take weight loss medication? no  Do you take any blood-thinning medications such as: (Plavix, aspirin, Coumadin, Aggrenox, Brilinta, Xarelto, Eliquis, Pradaxa, Savaysa or Effient) no  If yes we need the name, milligram, dosage and who is prescribing doctor:               Current Outpatient Medications  Medication Sig Dispense Refill   atorvastatin (LIPITOR) 40 MG tablet Take 1 tablet (40 mg total) by mouth daily. 90 tablet 3   celecoxib (CELEBREX) 100 MG capsule Take 1 capsule (100 mg total) by mouth 2 (two) times daily as needed. 60 capsule 3   diphenhydrAMINE HCl (BENADRYL ALLERGY PO) Take by mouth as needed.     DULoxetine (CYMBALTA) 60 MG capsule TAKE 1 CAPSULE BY MOUTH EVERY DAY 90 capsule 1   gabapentin (NEURONTIN) 100 MG capsule Take 6 capsules (600 mg total) by mouth 3 times/day as needed-between meals & bedtime. 180 capsule 11   meloxicam  (MOBIC) 15 MG tablet Take 1 tablet (15 mg total) by mouth daily as needed for pain. After meal 30 tablet 1   sildenafil (VIAGRA) 100 MG tablet TAKE 0.5-1 TABLETS BY MOUTH DAILY AS NEEDED FOR ERECTILE DYSFUNCTION. 6 tablet 7   No current facility-administered medications for this visit.    Allergies  Allergen Reactions   Hydrocodone Itching   Oxycodone Hives

## 2022-01-08 ENCOUNTER — Ambulatory Visit: Payer: 59 | Admitting: Neurology

## 2022-01-11 ENCOUNTER — Other Ambulatory Visit: Payer: Self-pay | Admitting: Neurology

## 2022-01-12 NOTE — Telephone Encounter (Signed)
Pt is asking for a call re: there being another MRI that is supposed to be scheduled.

## 2022-01-12 NOTE — Telephone Encounter (Signed)
I called the patient and he said that Dr. Nelva Bush did a x-ray of his back today and they are going to try to get the MRI approved. I will ask GI to withdraw their case with [employer] so Dr. Nelva Bush' office is not blocked from submitting their own authorization.

## 2022-01-14 NOTE — Procedures (Signed)
[name] POLYSOMNOGRAPHY  INTERPRETATION REPORT   STUDY DATE:  01/01/2022     PATIENT NAME:  Ryan Rojas         DATE OF BIRTH:  09/13/1964  PATIENT ID:  [id number]    TYPE OF STUDY:  PSG  READING PHYSICIAN: Melvyn Novas, MD REFERRED BY: Dr. Terrace Arabia, GNA  SCORING TECHNICIAN: Domingo Cocking, RPSGT   HISTORY: 11-05-2021  Ryan Rojas is a 56 year-old Male patient who voiced a chief complaint of poorly restorative sleep, fragmented sleep, dry mouth, gasping for air and pain with plexopathy, short sleeper, morning headaches.  ADDITIONAL INFORMATION:  The Epworth Sleepiness Scale was endorsed at 04 /24 points (scores above or equal to 10 are suggestive of hypersomnolence). FSS endorsed at  39/63 points.  Height: 72 in Weight: 243 lb (BMI 32) Neck Size: 18 " MEDICATIONS: Lipitor, Celebrex, Benadryl allergy, Cymbalta, Dilaudid, Zofran, Viagra, Ambien TECHNICAL DESCRIPTION: A registered sleep technologist ( RPSGT)  was in attendance for the duration of the recording.  Data collection, scoring, video monitoring, and reporting were performed in compliance with the AASM Manual for the Scoring of Sleep and Associated Events; (Hypopnea is scored based on the criteria listed in Section VIII D. 1b in the AASM Manual V2.6 using a 4% oxygen desaturation rule or Hypopnea is scored based on the criteria listed in Section VIII D. 1a in the AASM Manual V2.6 using 3% oxygen desaturation and /or arousal rule).   SLEEP CONTINUITY AND SLEEP ARCHITECTURE:  Lights-out was at 21:54: and lights-on at  05:02:, with  7.1hours of recording time. Total sleep time ( TST) was 130.5 minutes with a decreased sleep efficiency at 30.5%.   Sleep latency was increased at 73.5 minutes.  REM sleep latency was increased at 329.0 minutes. Of the total sleep time, the percentage of stage N1 sleep was 36.0%, stage N2 sleep was 57%, stage N3 sleep was 0.0%, and REM sleep was 6.5%.  There was 1  Stage R period observed on this study night, 26 awakenings (i.e. transitions to Stage W from any sleep stage), and 83 total stage transitions. Wake after sleep onset (WASO) time accounted for 223 minutes!Marland Kitchen  BODY POSITION:  TST was divided  between the following sleep positions: supine sleep for 65 minutes (50%), non-supine sleep for 66 minutes (50%); right-lateral sleep for 24 minutes (19%), left 41 minutes (31%), and prone 00 minutes (0%).  Total supine REM sleep time was 08 minutes (100% of total REM sleep).  RESPIRATORY MONITORING:   Based on AASM criteria (using a 3% oxygen desaturation and /or arousal rule for scoring hypopneas), there were 11 apneas (11 obstructive; 0 central; 0 mixed), and 7 hypopneas noted. The Apnea index was 5.1/h. Hypopnea index was 3.2/h. The AHI ( apnea-hypopnea index) was 8.3/h overall . AHI was 7.4/h in supine, 0/h in non-supine; 21.2/h in  REM sleep- all supine. There were 0 respiratory effort-related arousals (RERAs).   OXIMETRY: Oxyhemoglobin Saturation Nadir during sleep was at  70%, down  from a mean of 92%.  Of the Total sleep time (TST) in  hypoxemia (<89%) was present for 4.6 minutes, or 3.5% of total sleep time.  LIMB MOVEMENTS: There were 0 periodic limb movements of sleep (0.0/h). AROUSAL: There were 68 arousals in total, for an arousal index of 31 /hour.  Of these, 10 were identified as respiratory-related arousals (5 /h), 0 were PLM-related arousals (0 /h), and 71 were non-specific arousals (33 /h) and one bathroom visit.  There were 0 occurrences of Cheyne Stokes breathing.  EEG:  PSG EEG was of normal amplitude and frequency, with symmetric manifestation of sleep stages. EKG: The electrocardiogram documented NSR .  The average heart rate during sleep was 90 bpm.  The heart rate during sleep varied between a minimum of 79 and  a maximum of  106 bpm. AUDIO and VIDEO: no complex sleep motor activity was recorded, no vocalizations or phonic sounds.    IMPRESSION: 1) Sleep disordered breathing was present. Obstructive Sleep apnea was seen at a mild degree ( AHI 8.3/h) , REM sleep associated without significant oxygen desaturation.    2) Sleep architecture was severely fragmented  with over 210 minutes of wake time, many brief arousals were not related to physiological changes and can be attributed to his recorded chronic pain and discomfort.  3) Sleep efficiency was poor, confirming the patient's report of problems to maintain sleep. Total sleep time was reduced at 130.5 minutes.  Sleep efficiency was decreased at 30.5%.  4) resting heart rate was high.     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 I suggest to offer imipramine for sleep aid. 1) mild REM sleep dependent apnea, no central events. this is a minor contributor to the lower sleep quality but I will offer CPAP trial . A ResMed auto CPAP devie ., set between  6-15 cm water, with  2 cm EPR , heated humidification and interface of patient choice will be provided and follow up will be between 30-90 days of therapy.   Larey Seat, MD

## 2022-01-14 NOTE — Addendum Note (Signed)
Addended by: Larey Seat on: 01/14/2022 04:42 PM   Modules accepted: Orders

## 2022-01-15 ENCOUNTER — Telehealth: Payer: Self-pay | Admitting: *Deleted

## 2022-01-15 MED ORDER — IMIPRAMINE HCL 25 MG PO TABS: 25.0000 mg | ORAL_TABLET | Freq: Every day | ORAL | 2 refills | Status: DC

## 2022-01-15 NOTE — Telephone Encounter (Signed)
-----   Message from Larey Seat, MD sent at 01/14/2022  4:42 PM EDT ----- IMPRESSION: 1) Sleep disordered breathing was present. Obstructive Sleep apnea was seen at a mild degree ( AHI 8.3/h) , REM sleep associated withoutsignificant oxygen desaturation.  2) Sleep architecture was severely fragmented with over 210 minutes of wake time, many brief arousals were not related to physiological changes and can be attributed to his recorded chronic painand discomfort.  3)Sleep efficiency waspoor, confirming the patient'sreport of problems to maintain sleep. Total sleep time was reducedat 130.1minutes. Sleep efficiency was decreasedat 30.5%. 4) resting heart rate was high.  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 I suggest to offer imipramine for sleep aid. 1) mild REM sleep dependent apnea, no central events. this is a minor contributor to the lower sleep quality but I will offer CPAP trial . A ResMed auto CPAP devie ., set between 6-15 cm water, with 2 cm EPR , heated humidification and interface of patient choice will be provided and follow up will be between 30-90 days of therapy.

## 2022-01-15 NOTE — Telephone Encounter (Signed)
I called pt. I advised pt that Dr. Brett Fairy reviewed their sleep study results and found that pt has sleep apnea. Dr. Brett Fairy recommends that pt start CPAP. I reviewed PAP compliance expectations with the pt. Pt is agreeable to starting a CPAP. I advised pt that an order will be sent to a DME, [facility], and [facility] will call the pt within about one week after they file with the pt's insurance. [facility] will show the pt how to use the machine, fit for masks, and troubleshoot the CPAP if needed. I provided him their phone number to call if he does not hear from them in the next week: [phone].  A follow up appt was made for insurance purposes with Amy Lomax,NP on 04/15/22 at 9am. Pt verbalized understanding to arrive 15 minutes early and bring their CPAP. Pt verbalized understanding of results. Pt had no questions at this time but was encouraged to call back if questions arise. I have sent the order to [place] and have received confirmation that they have received the order.   Dr. Brett Fairy recommends imipramine 25mg  po qhs as sleep aid. Pt agreeable to try this and would like it called in [facility] [place]. Received the following notification when trying to e-scribe below. Will speak with MD to confirm this is ok still. Per Dr. Brett Fairy, wants [place] to confirm if pt still on duloxetine and if it helps. If ineffective, he can stop and start imipramine instead.  I called pt. He states he only takes duloxetine intermittently, feels its ineffective. He is agreeable to stop this and start imipramine. Rx imipramine e-scribed to pharmacy.

## 2022-01-20 ENCOUNTER — Telehealth: Payer: Self-pay | Admitting: Orthopaedic Surgery

## 2022-01-20 NOTE — Telephone Encounter (Signed)
Received medical records release form,$25.00 cash and disability paperwork from [place] and patient/forwarding to [place] today

## 2022-01-23 ENCOUNTER — Other Ambulatory Visit: Payer: Self-pay | Admitting: Physician Assistant

## 2022-01-23 MED ORDER — ONDANSETRON HCL 4 MG PO TABS: 4.0000 mg | ORAL_TABLET | Freq: Three times a day (TID) | ORAL | 0 refills | Status: DC | PRN

## 2022-01-23 MED ORDER — TRAMADOL HCL 50 MG PO TABS: 50.0000 mg | ORAL_TABLET | Freq: Four times a day (QID) | ORAL | 2 refills | Status: DC | PRN

## 2022-01-23 NOTE — Progress Notes (Signed)
Ok to schedule. ASA 2.  

## 2022-01-26 ENCOUNTER — Encounter: Payer: Self-pay | Admitting: *Deleted

## 2022-01-26 MED ORDER — PEG 3350-KCL-NA BICARB-NACL 420 G PO SOLR: 4000.0000 mL | Freq: Once | ORAL | 0 refills | Status: AC

## 2022-01-27 ENCOUNTER — Telehealth: Payer: Self-pay | Admitting: Internal Medicine

## 2022-01-27 NOTE — Telephone Encounter (Signed)
Patient left a message saying he had an appt with Dr. Marletta Lor on 12/5 and needed to reschedule.

## 2022-01-28 ENCOUNTER — Encounter: Payer: Self-pay | Admitting: *Deleted

## 2022-01-28 NOTE — Telephone Encounter (Signed)
Pt has been rescheduled until 02/24/22 at 12:30 pm. New instructions have been mailed

## 2022-01-29 ENCOUNTER — Encounter: Payer: Self-pay | Admitting: Orthopaedic Surgery

## 2022-01-29 DIAGNOSIS — M7541 Impingement syndrome of right shoulder: Secondary | ICD-10-CM

## 2022-01-29 DIAGNOSIS — M75111 Incomplete rotator cuff tear or rupture of right shoulder, not specified as traumatic: Secondary | ICD-10-CM

## 2022-01-29 DIAGNOSIS — M7521 Bicipital tendinitis, right shoulder: Secondary | ICD-10-CM

## 2022-01-29 NOTE — Telephone Encounter (Signed)
Pt has been rescheduled for 02/24/22 at 12:30 pm. New instructions have been sent to patient.

## 2022-02-10 ENCOUNTER — Encounter: Payer: Self-pay | Admitting: Orthopaedic Surgery

## 2022-02-10 ENCOUNTER — Ambulatory Visit (INDEPENDENT_AMBULATORY_CARE_PROVIDER_SITE_OTHER): Payer: 59 | Admitting: Orthopaedic Surgery

## 2022-02-10 DIAGNOSIS — Z9889 Other specified postprocedural states: Secondary | ICD-10-CM

## 2022-02-10 NOTE — Progress Notes (Signed)
Post-Op Visit Note   Patient: Ryan Rojas           Date of Birth: 08-Sep-1965           MRN: [id number] Visit Date: 02/10/2022 PCP: Mechele Claude, MD   Assessment & Plan:  Chief Complaint:  Chief Complaint  Patient presents with   Right Shoulder - Follow-up    Right shoulder arthroscopy 01/29/2022   Visit Diagnoses:  1. S/P arthroscopy of right shoulder     Plan: Patient is a pleasant 56 year old gentleman who comes in today nearly 2 weeks status post right shoulder arthroscopic debridement, decompression and acromioplasty, date of surgery 01/29/2022.  He has been doing well.  He is not taking anything for pain.  Examination of his right shoulder reveals well-healed surgical portals without evidence of infection or cellulitis.  Fingers warm well perfused.  He is neurovascular tact distally.  Today, sutures were removed and Steri-Strips applied.  Intraoperative pictures reviewed.  Outpatient referral to physical therapy has been made.  He will follow-up in 4 weeks for recheck.  Call with concerns or questions.  Follow-Up Instructions: Return in about 4 weeks (around 03/10/2022).   Orders:  No orders of the defined types were placed in this encounter.  No orders of the defined types were placed in this encounter.   Imaging: No new imaging  PMFS History: Patient Active Problem List   Diagnosis Date Noted   Low back pain without sciatica 12/31/2021   Tendinopathy of right biceps tendon 12/23/2021   Carpal tunnel syndrome of right wrist 12/09/2021   Chronic midline low back pain with right-sided sciatica 12/09/2021   Paresthesia 12/09/2021   S/P left rotator cuff repair 12/09/2021   Nontraumatic incomplete tear of right rotator cuff 12/09/2021   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   Opiate analgesic use agreement exists 11/05/2021   Gasping for breath 11/05/2021   At risk for central sleep apnea 11/05/2021   Risk factors for obstructive sleep apnea 11/05/2021    Sleep related headaches 11/05/2021   Right hand paresthesia 09/11/2021   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   Obstructive sleep apnea 09/11/2021   Nontraumatic complete tear of left rotator cuff 05/28/2021   Impingement syndrome of left shoulder 05/28/2021   Tendinopathy of left biceps tendon 05/28/2021   Arm paresthesia, left 01/02/2021   Chronic right shoulder pain 01/02/2021   Other fatigue 01/02/2021   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   Neuropathic pain 09/30/2020   Left arm weakness 09/17/2020   Left arm pain 09/17/2020   Neck pain on left side 09/17/2020   Cubital tunnel syndrome on left 08/16/2020   Intractable neuropathic pain of lumbosacral origin 05/20/2020   Combined arterial insufficiency and corporo-venous occlusive erectile dysfunction 09/27/2019   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   Impotence 01/18/2018   Hyperlipidemia 07/16/2017   Past Medical History:  Diagnosis Date   Back pain    Hyperlipidemia     Family History  Problem Relation Age of Onset   Asthma Mother    Diabetes Sister    Asthma Sister     Past Surgical History:  Procedure Laterality Date   ROTATOR CUFF REPAIR Left 07/10/2021   Social History   Occupational History   Not on file  Tobacco Use   Smoking status: Never   Smokeless tobacco: Never  Vaping Use   Vaping Use: Never used  Substance and Sexual Activity   Alcohol use: Yes    Alcohol/week: 1.0 standard drink of alcohol  Types: 1 Cans of beer per week   Drug use: No   Sexual activity: Yes    Birth control/protection: None

## 2022-02-11 ENCOUNTER — Other Ambulatory Visit: Payer: Self-pay | Admitting: Neurology

## 2022-02-13 ENCOUNTER — Encounter: Payer: Self-pay | Admitting: Family Medicine

## 2022-02-23 ENCOUNTER — Ambulatory Visit: Payer: 59

## 2022-02-24 ENCOUNTER — Ambulatory Visit (HOSPITAL_COMMUNITY): Payer: 59 | Admitting: Certified Registered Nurse Anesthetist

## 2022-02-24 ENCOUNTER — Ambulatory Visit (HOSPITAL_COMMUNITY)
Admission: RE | Admit: 2022-02-24 | Discharge: 2022-02-24 | Disposition: A | Discharge status: Home / Self Care | Payer: 59 | Attending: Internal Medicine | Admitting: Internal Medicine

## 2022-02-24 ENCOUNTER — Ambulatory Visit (HOSPITAL_BASED_OUTPATIENT_CLINIC_OR_DEPARTMENT_OTHER): Payer: 59 | Admitting: Certified Registered Nurse Anesthetist

## 2022-02-24 ENCOUNTER — Encounter (HOSPITAL_COMMUNITY): Payer: Self-pay

## 2022-02-24 ENCOUNTER — Encounter (HOSPITAL_COMMUNITY)
Admission: RE | Disposition: A | Discharge status: Home / Self Care | Payer: Self-pay | Source: Home / Self Care | Attending: Internal Medicine

## 2022-02-24 ENCOUNTER — Other Ambulatory Visit: Payer: Self-pay

## 2022-02-24 DIAGNOSIS — Z1211 Encounter for screening for malignant neoplasm of colon: Secondary | ICD-10-CM

## 2022-02-24 DIAGNOSIS — M1990 Unspecified osteoarthritis, unspecified site: Secondary | ICD-10-CM | POA: Diagnosis not present

## 2022-02-24 DIAGNOSIS — Z1212 Encounter for screening for malignant neoplasm of rectum: Secondary | ICD-10-CM

## 2022-02-24 DIAGNOSIS — K648 Other hemorrhoids: Secondary | ICD-10-CM

## 2022-02-24 DIAGNOSIS — G4730 Sleep apnea, unspecified: Secondary | ICD-10-CM | POA: Diagnosis not present

## 2022-02-24 HISTORY — PX: COLONOSCOPY WITH PROPOFOL: SHX5780

## 2022-02-24 SURGERY — COLONOSCOPY WITH PROPOFOL
Anesthesia: General

## 2022-02-24 MED ORDER — PROPOFOL 500 MG/50ML IV EMUL
INTRAVENOUS | Status: DC | PRN
  Administered 2022-02-24: 175 ug/kg/min via INTRAVENOUS

## 2022-02-24 MED ORDER — LACTATED RINGERS IV SOLN: INTRAVENOUS | Status: DC | PRN

## 2022-02-24 MED ORDER — LACTATED RINGERS IV SOLN: INTRAVENOUS | Status: DC

## 2022-02-24 MED ORDER — PHENYLEPHRINE 80 MCG/ML (10ML) SYRINGE FOR IV PUSH (FOR BLOOD PRESSURE SUPPORT)
PREFILLED_SYRINGE | INTRAVENOUS | Status: DC | PRN
  Administered 2022-02-24: 80 ug via INTRAVENOUS
  Administered 2022-02-24: 40 ug via INTRAVENOUS

## 2022-02-24 MED ORDER — PROPOFOL 10 MG/ML IV BOLUS
INTRAVENOUS | Status: DC | PRN
  Administered 2022-02-24: 50 mg via INTRAVENOUS

## 2022-02-24 NOTE — Discharge Instructions (Addendum)

## 2022-02-24 NOTE — Anesthesia Preprocedure Evaluation (Signed)
Anesthesia Evaluation  Patient identified by MRN, date of birth, ID band Patient awake    Reviewed: Allergy & Precautions, NPO status , Patient's Chart, lab work & pertinent test results  Airway Mallampati: II       Dental no notable dental hx.    Pulmonary sleep apnea    Pulmonary exam normal        Cardiovascular Exercise Tolerance: Good METS: 7 - 9 Mets negative cardio ROS Normal cardiovascular exam     Neuro/Psych  negative psych ROS   GI/Hepatic negative GI ROS, Neg liver ROS,,,  Endo/Other  negative endocrine ROS    Renal/GU negative Renal ROS     Musculoskeletal  (+) Arthritis , Osteoarthritis,    Abdominal Normal abdominal exam  (+)   Peds  Hematology negative hematology ROS (+)   Anesthesia Other Findings   Reproductive/Obstetrics                             Anesthesia Physical Anesthesia Plan  ASA: 2  Anesthesia Plan: General   Post-op Pain Management:    Induction:   PONV Risk Score and Plan: 1 and TIVA  Airway Management Planned: Nasal Cannula  Additional Equipment:   Intra-op Plan:   Post-operative Plan:   Informed Consent: I have reviewed the patients History and Physical, chart, labs and discussed the procedure including the risks, benefits and alternatives for the proposed anesthesia with the patient or authorized representative who has indicated his/her understanding and acceptance.       Plan Discussed with: CRNA  Anesthesia Plan Comments:        Anesthesia Quick Evaluation

## 2022-02-24 NOTE — H&P (Signed)
Primary Care Physician:  Mechele Claude, MD Primary Gastroenterologist:  Dr. Marletta Lor  Pre-Procedure History & Physical: HPI:  Ryan Rojas is a 56 y.o. male is here for first ever colonoscopy for colon cancer screening purposes.  Patient denies any family history of colorectal cancer.  No melena or hematochezia.  No abdominal pain or unintentional weight loss.  No change in bowel habits.  Overall feels well from a GI standpoint.  Past Medical History:  Diagnosis Date   Back pain    Hyperlipidemia     Past Surgical History:  Procedure Laterality Date   ROTATOR CUFF REPAIR Left 07/10/2021    Prior to Admission medications   Medication Sig Start Date End Date Taking? Authorizing Provider  albuterol (VENTOLIN HFA) 108 (90 Base) MCG/ACT inhaler Inhale 2 puffs into the lungs every 6 (six) hours as needed for wheezing or shortness of breath.   Yes [provider]  atorvastatin (LIPITOR) 40 MG tablet Take 1 tablet (40 mg total) by mouth daily. 04/09/21  Yes Stacks, Broadus John, MD  bisacodyl (DULCOLAX) 5 MG EC tablet Take 5 mg by mouth daily as needed for moderate constipation.   Yes [provider]  celecoxib (CELEBREX) 100 MG capsule TAKE 1 CAPSULE BY MOUTH 2 TIMES DAILY AS NEEDED. 01/12/22  Yes Levert Feinstein, MD  gabapentin (NEURONTIN) 100 MG capsule Take 6 capsules (600 mg total) by mouth 3 times/day as needed-between meals & bedtime. 12/09/21  Yes Levert Feinstein, MD  imipramine (TOFRANIL) 25 MG tablet TAKE 1 TABLET BY MOUTH EVERYDAY AT BEDTIME 02/11/22  Yes Dohmeier, Porfirio Mylar, MD  naproxen sodium (ALEVE) 220 MG tablet Take 220 mg by mouth 2 (two) times daily as needed (pain).   Yes [provider]  meloxicam (MOBIC) 15 MG tablet Take 1 tablet (15 mg total) by mouth daily as needed for pain. After meal Patient not taking: Reported on 02/19/2022 12/31/21   Levert Feinstein, MD  ondansetron (ZOFRAN) 4 MG tablet Take 1 tablet (4 mg total) by mouth every 8 (eight) hours as needed for nausea or  vomiting. Patient not taking: Reported on 02/19/2022 01/23/22   Cristie Hem, PA-C  sildenafil (VIAGRA) 100 MG tablet TAKE 0.5-1 TABLETS BY MOUTH DAILY AS NEEDED FOR ERECTILE DYSFUNCTION. 11/18/21   Mechele Claude, MD  traMADol (ULTRAM) 50 MG tablet Take 1-2 tablets (50-100 mg total) by mouth every 6 (six) hours as needed. To be taken after surgery Patient not taking: Reported on 02/19/2022 01/23/22   Cristie Hem, PA-C    Allergies as of 01/26/2022 - Review Complete 12/17/2021  Allergen Reaction Noted   Hydrocodone Itching 01/13/2016   Oxycodone Hives 07/09/2021    Family History  Problem Relation Age of Onset   Asthma Mother    Diabetes Sister    Asthma Sister     Social History   Socioeconomic History   Marital status: Single    Spouse name: Not on file   Number of children: 3   Years of education: Not on file   Highest education level: High school graduate  Occupational History   Not on file  Tobacco Use   Smoking status: Never   Smokeless tobacco: Never  Vaping Use   Vaping Use: Never used  Substance and Sexual Activity   Alcohol use: Yes    Alcohol/week: 1.0 standard drink of alcohol    Types: 1 Cans of beer per week   Drug use: No   Sexual activity: Yes    Birth control/protection: None  Other  Topics Concern   Not on file  Social History Narrative   Lives alone   L handed   Caffeine: 1 C of coffee every AM   Social Determinants of Health   Financial Resource Strain: Not on file  Food Insecurity: Not on file  Transportation Needs: Not on file  Physical Activity: Not on file  Stress: Not on file  Social Connections: Not on file  Intimate Partner Violence: Not on file    Review of Systems: See HPI, otherwise negative ROS  Physical Exam: Vital signs in last 24 hours:     General:   Alert,  Well-developed, well-nourished, pleasant and cooperative in NAD Head:  Normocephalic and atraumatic. Eyes:  Sclera clear, no icterus.   Conjunctiva  pink. Ears:  Normal auditory acuity. Nose:  No deformity, discharge,  or lesions. Msk:  Symmetrical without gross deformities. Normal posture. Extremities:  Without clubbing or edema. Neurologic:  Alert and  oriented x4;  grossly normal neurologically. Skin:  Intact without significant lesions or rashes. Psych:  Alert and cooperative. Normal mood and affect.  Impression/Plan: Ryan Rojas is here for a colonoscopy to be performed for colon cancer screening purposes.  The risks of the procedure including infection, bleed, or perforation as well as benefits, limitations, alternatives and imponderables have been reviewed with the patient. Questions have been answered. All parties agreeable.

## 2022-02-24 NOTE — Transfer of Care (Signed)
Immediate Anesthesia Transfer of Care Note  Patient: Ryan Rojas  Procedure(s) Performed: COLONOSCOPY WITH PROPOFOL  Patient Location: PACU  Anesthesia Type:General  Level of Consciousness: awake, alert , and oriented  Airway & Oxygen Therapy: Patient Spontanous Breathing and Patient connected to nasal cannula oxygen  Post-op Assessment: Report given to RN and Post -op Vital signs reviewed and stable  Post vital signs: Reviewed and stable  Last Vitals:  Vitals Value Taken Time  BP    Temp    Pulse    Resp    SpO2      Last Pain:  Vitals:   02/24/22 1106  TempSrc:   PainSc: 0-No pain      Patients Stated Pain Goal: 6 (02/24/22 1026)  Complications: No notable events documented.

## 2022-02-24 NOTE — Anesthesia Postprocedure Evaluation (Signed)
Anesthesia Post Note  Patient: Ryan Rojas  Procedure(s) Performed: COLONOSCOPY WITH PROPOFOL  Patient location during evaluation: Endoscopy Anesthesia Type: General Level of consciousness: awake and alert Pain management: pain level controlled Vital Signs Assessment: post-procedure vital signs reviewed and stable Respiratory status: spontaneous breathing, nonlabored ventilation, respiratory function stable and patient connected to nasal cannula oxygen Cardiovascular status: blood pressure returned to baseline and stable Postop Assessment: no apparent nausea or vomiting Anesthetic complications: no   There were no known notable events for this encounter.   Last Vitals:  Vitals:   02/24/22 1136 02/24/22 1149  BP: 101/62 119/84  Pulse: (!) 105 97  Resp: (!) 24 16  Temp: 36.5 C   SpO2: 95% 100%    Last Pain:  Vitals:   02/24/22 1149  TempSrc:   PainSc: 0-No pain                 Glynis Smiles

## 2022-02-24 NOTE — Op Note (Signed)
[facility] Patient Name: Ryan Rojas Procedure Date: 02/24/2022 10:53 AM MRN: [id number] Date of Birth: 21-Sep-1965 Attending MD: Elon Alas. Abbey Chatters , [place], [id number] CSN: [id number] Age: 56 Admit Type: Outpatient Procedure:                Colonoscopy Indications:              Screening for colorectal malignant neoplasm Providers:                Elon Alas. Abbey Chatters, DO, Hughie Closs RN, RN, Wynonia Musty Tech, Technician Referring MD:              Medicines:                See the Anesthesia note for documentation of the                            administered medications Complications:            No immediate complications. Estimated Blood Loss:     Estimated blood loss: none. Procedure:                Pre-Anesthesia Assessment:                           - The anesthesia plan was to use monitored                            anesthesia care (MAC).                           After obtaining informed consent, the colonoscope                            was passed under direct vision. Throughout the                            procedure, the patient's blood pressure, pulse, and                            oxygen saturations were monitored continuously. The                            [id number]-HQ190L ([id number]) scope was introduced through                            the anus and advanced to the the cecum, identified                            by appendiceal orifice and ileocecal valve. The                            colonoscopy was performed without difficulty. The                            patient tolerated the procedure  well. The quality                            of the bowel preparation was evaluated using the                            BBPS [facility] Bowel Preparation Scale) with scores                            of: Right Colon = 2 (minor amount of residual                            staining, small fragments of stool and/or opaque                             liquid, but mucosa seen well), Transverse Colon = 2                            (minor amount of residual staining, small fragments                            of stool and/or opaque liquid, but mucosa seen                            well) and Left Colon = 2 (minor amount of residual                            staining, small fragments of stool and/or opaque                            liquid, but mucosa seen well). The total BBPS score                            equals 6. Fair. Scope In: 11:10:07 AM Scope Out: 11:34:36 AM Scope Withdrawal Time: 0 hours 20 minutes 53 seconds  Total Procedure Duration: 0 hours 24 minutes 29 seconds  Findings:      The perianal and digital rectal examinations were normal.      Non-bleeding internal hemorrhoids were found during endoscopy.      A moderate amount of food particles/ stool was found in the entire       colon, making visualization difficult. Lavage of the area was performed       using copious amounts of sterile water, resulting in clearance with fair       visualization. Impression:               - Non-bleeding internal hemorrhoids.                           - Stool in the entire examined colon.                           - No specimens collected. Moderate Sedation:      Per Anesthesia Care Recommendation:           -  Patient has a contact number available for                            emergencies. The signs and symptoms of potential                            delayed complications were discussed with the                            patient. Return to normal activities tomorrow.                            Written discharge instructions were provided to the                            patient.                           - Resume previous diet.                           - Continue present medications.                           - Repeat colonoscopy in 10 years for screening                            purposes.                           - Return to  GI clinic PRN. Procedure Code(s):        --- Professional ---                           K3491, Colorectal cancer screening; colonoscopy on                            individual not meeting criteria for high risk Diagnosis Code(s):        --- Professional ---                           Z12.11, Encounter for screening for malignant                            neoplasm of colon                           K64.8, Other hemorrhoids CPT copyright 2022 American [employer]. All rights reserved. The codes documented in this report are preliminary and upon coder review may  be revised to meet current compliance requirements. Elon Alas. Abbey Chatters, DO [place] Abbey Chatters, DO 02/24/2022 11:36:47 AM This report has been signed electronically. Number of Addenda: 0

## 2022-02-26 ENCOUNTER — Ambulatory Visit (INDEPENDENT_AMBULATORY_CARE_PROVIDER_SITE_OTHER): Payer: 59 | Admitting: Family Medicine

## 2022-02-26 ENCOUNTER — Encounter: Payer: Self-pay | Admitting: Family Medicine

## 2022-02-26 VITALS — BP 120/65 | HR 87 | Temp 98.0°F | Ht 72.0 in | Wt 250.5 lb

## 2022-02-26 DIAGNOSIS — J0140 Acute pansinusitis, unspecified: Secondary | ICD-10-CM

## 2022-02-26 DIAGNOSIS — J40 Bronchitis, not specified as acute or chronic: Secondary | ICD-10-CM

## 2022-02-26 MED ORDER — PREDNISONE 20 MG PO TABS: 20.0000 mg | ORAL_TABLET | Freq: Every day | ORAL | 0 refills | Status: AC

## 2022-02-26 MED ORDER — DOXYCYCLINE HYCLATE 100 MG PO TABS: 100.0000 mg | ORAL_TABLET | Freq: Two times a day (BID) | ORAL | 0 refills | Status: AC

## 2022-02-26 MED ORDER — BENZONATATE 100 MG PO CAPS: 100.0000 mg | ORAL_CAPSULE | Freq: Three times a day (TID) | ORAL | 0 refills | Status: DC | PRN

## 2022-02-26 MED ORDER — ALBUTEROL SULFATE HFA 108 (90 BASE) MCG/ACT IN AERS: 2.0000 | INHALATION_SPRAY | Freq: Four times a day (QID) | RESPIRATORY_TRACT | 0 refills | Status: DC | PRN

## 2022-02-26 NOTE — Progress Notes (Signed)
Acute Office Visit  Subjective:     Patient ID: Ryan Rojas, male    DOB: [birth year], 56 y.o.   MRN: [id number]  Chief Complaint  Patient presents with   Cough    Cough This is a new problem. Episode onset: 12 days. The problem has been gradually worsening. The cough is Productive of sputum (yellow, white). Associated symptoms include chest pain (with coughing), headaches, nasal congestion, shortness of breath and wheezing. Pertinent negatives include no ear congestion, ear pain, fever, postnasal drip or sore throat. Associated symptoms comments: Sinus pressure. He has tried a beta-agonist inhaler and OTC cough suppressant for the symptoms. The treatment provided moderate relief. There is no history of asthma, COPD or pneumonia.    Review of Systems  Constitutional:  Negative for fever.  HENT:  Negative for ear pain, postnasal drip and sore throat.   Respiratory:  Positive for cough, shortness of breath and wheezing.   Cardiovascular:  Positive for chest pain (with coughing).  Neurological:  Positive for headaches.        Objective:    BP 120/65   Pulse 87   Temp 98 F (36.7 C) (Temporal)   Ht 6' (1.829 m)   Wt 250 lb 8 oz (113.6 kg)   SpO2 97%   BMI 33.97 kg/m    Physical Exam Vitals and nursing note reviewed.  Constitutional:      General: He is not in acute distress.    Appearance: He is not ill-appearing, toxic-appearing or diaphoretic.  HENT:     Nose: Congestion present.     Right Sinus: Maxillary sinus tenderness and frontal sinus tenderness present.     Left Sinus: Maxillary sinus tenderness and frontal sinus tenderness present.     Mouth/Throat:     Mouth: Mucous membranes are moist.     Pharynx: Posterior oropharyngeal erythema present. No pharyngeal swelling, oropharyngeal exudate or uvula swelling.     Tonsils: No tonsillar exudate or tonsillar abscesses. 1+ on the left.  Cardiovascular:     Rate and Rhythm: Normal rate and regular rhythm.      Heart sounds: Normal heart sounds. No murmur heard. Pulmonary:     Effort: Pulmonary effort is normal. No respiratory distress.     Breath sounds: Normal breath sounds. No wheezing, rhonchi or rales.  Chest:     Chest wall: No tenderness.  Musculoskeletal:     Cervical back: No rigidity.  Lymphadenopathy:     Cervical: No cervical adenopathy.  Skin:    General: Skin is warm and dry.  Neurological:     General: No focal deficit present.     Mental Status: He is alert and oriented to person, place, and time.  Psychiatric:        Mood and Affect: Mood normal.        Behavior: Behavior normal.        Thought Content: Thought content normal.        Judgment: Judgment normal.     No results found for any visits on 02/26/22.      Assessment & Plan:   Shaw was seen today for cough.  Diagnoses and all orders for this visit:  Bronchitis Prednisone burst as below. Tessalon perles and albuterol prn.  -     predniSONE (DELTASONE) 20 MG tablet; Take 1 tablet (20 mg total) by mouth daily for 5 days. -     benzonatate (TESSALON PERLES) 100 MG capsule; Take 1 capsule (100 mg total) by mouth  3 (three) times daily as needed for cough. -     albuterol (VENTOLIN HFA) 108 (90 Base) MCG/ACT inhaler; Inhale 2 puffs into the lungs every 6 (six) hours as needed for wheezing or shortness of breath.  Acute non-recurrent pansinusitis Doxycycline as below.  -     doxycycline (VIBRA-TABS) 100 MG tablet; Take 1 tablet (100 mg total) by mouth 2 (two) times daily for 7 days. 1 po bid   Discussed symptomatic care and return precautions.   Return in about 4 weeks (around 03/26/2022) for chronic follow up with PCP.  The patient indicates understanding of these issues and agrees with the plan.  Gabriel Earing, FNP

## 2022-03-02 ENCOUNTER — Ambulatory Visit: Payer: 59 | Attending: Physician Assistant

## 2022-03-02 ENCOUNTER — Other Ambulatory Visit: Payer: Self-pay

## 2022-03-02 DIAGNOSIS — M6281 Muscle weakness (generalized): Secondary | ICD-10-CM | POA: Diagnosis present

## 2022-03-02 DIAGNOSIS — M25612 Stiffness of left shoulder, not elsewhere classified: Secondary | ICD-10-CM | POA: Insufficient documentation

## 2022-03-02 DIAGNOSIS — M25511 Pain in right shoulder: Secondary | ICD-10-CM | POA: Insufficient documentation

## 2022-03-02 DIAGNOSIS — M25512 Pain in left shoulder: Secondary | ICD-10-CM | POA: Diagnosis present

## 2022-03-02 DIAGNOSIS — Z9889 Other specified postprocedural states: Secondary | ICD-10-CM | POA: Insufficient documentation

## 2022-03-02 NOTE — Therapy (Signed)
OUTPATIENT PHYSICAL THERAPY SHOULDER EVALUATION   Patient Name: Ryan Rojas MRN: [id number] DOB:Jun 06, 1965, 56 y.o., male Today's Date: 03/02/2022  END OF SESSION:  PT End of Session - 03/02/22 1353     Visit Number 1    Number of Visits 12    Date for PT Re-Evaluation 05/08/22    PT Start Time 1355    PT Stop Time 1430    PT Time Calculation (min) 35 min    Activity Tolerance Patient tolerated treatment well    Behavior During Therapy [facility] for tasks assessed/performed             Past Medical History:  Diagnosis Date   Back pain    Hyperlipidemia    Past Surgical History:  Procedure Laterality Date   ROTATOR CUFF REPAIR Left 07/10/2021   Patient Active Problem List   Diagnosis Date Noted   Low back pain without sciatica 12/31/2021   Tendinopathy of right biceps tendon 12/23/2021   Carpal tunnel syndrome of right wrist 12/09/2021   Chronic midline low back pain with right-sided sciatica 12/09/2021   Paresthesia 12/09/2021   S/P left rotator cuff repair 12/09/2021   Nontraumatic incomplete tear of right rotator cuff 12/09/2021   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   Opiate analgesic use agreement exists 11/05/2021   Gasping for breath 11/05/2021   At risk for central sleep apnea 11/05/2021   Risk factors for obstructive sleep apnea 11/05/2021   Sleep related headaches 11/05/2021   Right hand paresthesia 09/11/2021   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   Obstructive sleep apnea 09/11/2021   Nontraumatic complete tear of left rotator cuff 05/28/2021   Impingement syndrome of left shoulder 05/28/2021   Tendinopathy of left biceps tendon 05/28/2021   Arm paresthesia, left 01/02/2021   Chronic right shoulder pain 01/02/2021   Other fatigue 01/02/2021   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   Neuropathic pain 09/30/2020   Left arm weakness 09/17/2020   Left arm pain 09/17/2020   Neck pain on left side 09/17/2020   Cubital tunnel syndrome  on left 08/16/2020   Intractable neuropathic pain of lumbosacral origin 05/20/2020   Combined arterial insufficiency and corporo-venous occlusive erectile dysfunction 09/27/2019   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   Impotence 01/18/2018   Hyperlipidemia 07/16/2017    PCP: Mechele Claude, MD  REFERRING PROVIDER: Cristie Hem, PA-C  REFERRING DIAG: S/P arthroscopy of right shoulder   THERAPY DIAG:  No diagnosis found.  Rationale for Evaluation and Treatment: Rehabilitation  ONSET DATE: November 2023  SUBJECTIVE:  SUBJECTIVE STATEMENT: Patient reports that he had right shoulder surgery in November prior to Thanksgiving. He notes that this surgery was a lot better than is left shoulder shoulder surgery. He notes that the most discomfort that he has is when he tries to go to sleep at night as both of his shoulders bother him.   PERTINENT HISTORY: Brachial plexus injury, chronic low back pain, history of left rotator cuff repair  PAIN:  Are you having pain? Yes: NPRS scale: 6/10 Pain location: right anterior and lateral shoulder Pain description: throbbing Aggravating factors: sleeping at night, side lying Relieving factors: sitting in his chair  PRECAUTIONS: Shoulder  WEIGHT BEARING RESTRICTIONS: No  FALLS:  Has patient fallen in last 6 months? No  LIVING ENVIRONMENT: Lives with: lives with their spouse Lives in: House/apartment  OCCUPATION: Not working currently since November 14, 2021  PLOF: Independent  PATIENT GOALS: improved mobility, and use of his right arm  NEXT MD VISIT: January 2024  OBJECTIVE:   PATIENT SURVEYS:  FOTO 53.85  COGNITION: Overall cognitive status: Within functional limits for tasks assessed     SENSATION: Patient reports no numbness or  tingling  POSTURE: Forward head and rounded shoulders   UPPER EXTREMITY ROM:   Active ROM Right eval Left eval  Shoulder flexion 130; slow and required LLE for controlled descent 136  Shoulder extension    Shoulder abduction 140; slow and required LLE for controlled descent 114  Shoulder adduction    Shoulder internal rotation To L4   Shoulder external rotation To T2   Elbow flexion    Elbow extension    Wrist flexion    Wrist extension    Wrist ulnar deviation    Wrist radial deviation    Wrist pronation    Wrist supination    (Blank rows = not tested)  UPPER EXTREMITY MMT: not assessed at this time due to surgical condition  PALPATION:  No significant tenderness to palpation   TODAY'S TREATMENT:                                                                                                                                         DATE:                                    12/18 EXERCISE LOG  Exercise Repetitions and Resistance Comments  Wall slides  Flexion, ABD, and overhead arc x 5 reps    Blank cell = exercise not performed today   PATIENT EDUCATION: Education details: Plan of care, prognosis, objective measurements, healing, and goals for therapy Person educated: Patient Education method: Explanation Education comprehension: verbalized understanding  HOME EXERCISE PROGRAM: Wall slides were added to his HEP  ASSESSMENT:  CLINICAL IMPRESSION: Patient is a 56 y.o. male who was seen today for physical therapy evaluation  and treatment following a right shoulder arthroscopy.  He presented with low pain severity and irritability with right shoulder active range of motion being the most difficult and aggravating to his familiar symptoms.  Recommend that he continue with skilled physical therapy to address his impairments to return to his prior level of function.  OBJECTIVE IMPAIRMENTS: decreased ROM, decreased strength, impaired UE functional use, and pain.    ACTIVITY LIMITATIONS: carrying, lifting, and reach over head  PARTICIPATION LIMITATIONS: meal prep, cleaning, shopping, and occupation  PERSONAL FACTORS: 3+ comorbidities: Brachial plexus injury, chronic low back pain, history of left rotator cuff repair  are also affecting patient's functional outcome.   REHAB POTENTIAL: Good  CLINICAL DECISION MAKING: Evolving/moderate complexity  EVALUATION COMPLEXITY: Moderate   GOALS: Goals reviewed with patient? Yes  SHORT TERM GOALS: Target date: 03/23/22  Patient will be independent with his initial HEP. Baseline: Goal status: INITIAL  2.  Patient will report being able to lay supine without his familiar right shoulder pain exceeding 4/10. Baseline:  Goal status: INITIAL  3.  Patient will be able to lift at least 1 pound overhead with his right upper extremity for improved function with household activities. Baseline:  Goal status: INITIAL  LONG TERM GOALS: Target date: 04/13/22  Patient will be independent with his advanced HEP. Baseline:  Goal status: INITIAL  2.  Patient will be able to lift at least 5 pounds with his right upper extremity for improved function with household activities. Baseline:  Goal status: INITIAL  3.  Patient will be able to single-leg supine without his familiar right shoulder pain exceeding 2/10 for improved function with sleep. Baseline:  Goal status: INITIAL  PLAN:  PT FREQUENCY: 1-2x/week  PT DURATION: 6 weeks  PLANNED INTERVENTIONS: Therapeutic exercises, Therapeutic activity, Neuromuscular re-education, Patient/Family education, Self Care, Joint mobilization, Electrical stimulation, Cryotherapy, Moist heat, Vasopneumatic device, Manual therapy, and Re-evaluation  PLAN FOR NEXT SESSION: Wall slides, resisted rows, pull downs, and right upper extremity strengthening with modalities as needed   Granville Lewis, PT 03/02/2022, 2:45 PM

## 2022-03-03 ENCOUNTER — Encounter (HOSPITAL_COMMUNITY): Payer: Self-pay | Admitting: Internal Medicine

## 2022-03-04 ENCOUNTER — Other Ambulatory Visit: Payer: Self-pay | Admitting: Neurology

## 2022-03-11 ENCOUNTER — Ambulatory Visit: Payer: 59 | Admitting: Adult Health

## 2022-03-13 ENCOUNTER — Encounter: Payer: Self-pay | Admitting: *Deleted

## 2022-03-13 ENCOUNTER — Ambulatory Visit: Payer: 59 | Admitting: *Deleted

## 2022-03-13 DIAGNOSIS — M25511 Pain in right shoulder: Secondary | ICD-10-CM

## 2022-03-13 DIAGNOSIS — M25512 Pain in left shoulder: Secondary | ICD-10-CM

## 2022-03-13 DIAGNOSIS — M25612 Stiffness of left shoulder, not elsewhere classified: Secondary | ICD-10-CM

## 2022-03-13 DIAGNOSIS — M6281 Muscle weakness (generalized): Secondary | ICD-10-CM

## 2022-03-13 NOTE — Therapy (Signed)
OUTPATIENT PHYSICAL THERAPY SHOULDER EVALUATION   Patient Name: Ryan Rojas MRN: [id number] DOB:January 21, 1966, 56 y.o., male Today's Date: 03/13/2022  END OF SESSION:  PT End of Session - 03/13/22 0824     Visit Number 2    Number of Visits 12    Date for PT Re-Evaluation 05/08/22    PT Start Time 0815    PT Stop Time 0905    PT Time Calculation (min) 50 min             Past Medical History:  Diagnosis Date   Back pain    Hyperlipidemia    Past Surgical History:  Procedure Laterality Date   COLONOSCOPY WITH PROPOFOL N/A 02/24/2022   Procedure: COLONOSCOPY WITH PROPOFOL;  Surgeon: Eloise Harman, DO;  [facility]: [facility];  Service: Endoscopy;  Laterality: N/A;  10:30 AM, moved to 12/12 at 12:30   ROTATOR CUFF REPAIR Left 07/10/2021   Patient Active Problem List   Diagnosis Date Noted   Low back pain without sciatica 12/31/2021   Tendinopathy of right biceps tendon 12/23/2021   Carpal tunnel syndrome of right wrist 12/09/2021   Chronic midline low back pain with right-sided sciatica 12/09/2021   Paresthesia 12/09/2021   S/P left rotator cuff repair 12/09/2021   Nontraumatic incomplete tear of right rotator cuff 12/09/2021   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   Opiate analgesic use agreement exists 11/05/2021   Gasping for breath 11/05/2021   At risk for central sleep apnea 11/05/2021   Risk factors for obstructive sleep apnea 11/05/2021   Sleep related headaches 11/05/2021   Right hand paresthesia 09/11/2021   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   Obstructive sleep apnea 09/11/2021   Nontraumatic complete tear of left rotator cuff 05/28/2021   Impingement syndrome of left shoulder 05/28/2021   Tendinopathy of left biceps tendon 05/28/2021   Arm paresthesia, left 01/02/2021   Chronic right shoulder pain 01/02/2021   Other fatigue 01/02/2021   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   Neuropathic pain 09/30/2020   Left arm weakness  09/17/2020   Left arm pain 09/17/2020   Neck pain on left side 09/17/2020   Cubital tunnel syndrome on left 08/16/2020   Intractable neuropathic pain of lumbosacral origin 05/20/2020   Combined arterial insufficiency and corporo-venous occlusive erectile dysfunction 09/27/2019   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   Impotence 01/18/2018   Hyperlipidemia 07/16/2017    PCP: Claretta Fraise, MD  REFERRING PROVIDER: Aundra Dubin, PA-C  REFERRING DIAG: S/P arthroscopy of right shoulder   THERAPY DIAG:  Acute pain of right shoulder  Muscle weakness (generalized)  Acute pain of left shoulder  Stiffness of left shoulder, not elsewhere classified  Rationale for Evaluation and Treatment: Rehabilitation  ONSET DATE: November 2023  SUBJECTIVE:  SUBJECTIVE STATEMENT: Patient reports that he had right shoulder surgery in November prior to Thanksgiving. RT shldr 3/10 today PERTINENT HISTORY: Brachial plexus injury, chronic low back pain, history of left rotator cuff repair  PAIN:  Are you having pain? Yes: NPRS scale: 3/10 Pain location: right anterior and lateral shoulder Pain description: throbbing Aggravating factors: sleeping at night, side lying Relieving factors: sitting in his chair  PRECAUTIONS: Shoulder  WEIGHT BEARING RESTRICTIONS: No  FALLS:  Has patient fallen in last 6 months? No  LIVING ENVIRONMENT: Lives with: lives with their spouse Lives in: House/apartment  OCCUPATION: Not working currently since November 14, 2021  PLOF: Independent  PATIENT GOALS: improved mobility, and use of his right arm  NEXT MD VISIT: January 2024  OBJECTIVE:   PATIENT SURVEYS:  FOTO 53.85  COGNITION: Overall cognitive status: Within functional limits for tasks assessed     SENSATION: Patient  reports no numbness or tingling  POSTURE: Forward head and rounded shoulders   UPPER EXTREMITY ROM:   Active ROM Right eval Left eval  Shoulder flexion 130; slow and required LLE for controlled descent 136  Shoulder extension    Shoulder abduction 140; slow and required LLE for controlled descent 114  Shoulder adduction    Shoulder internal rotation To L4   Shoulder external rotation To T2   Elbow flexion    Elbow extension    Wrist flexion    Wrist extension    Wrist ulnar deviation    Wrist radial deviation    Wrist pronation    Wrist supination    (Blank rows = not tested)  UPPER EXTREMITY MMT: not assessed at this time due to surgical condition  PALPATION:  No significant tenderness to palpation   TODAY'S TREATMENT:                                                                                                                                         DATE:                                    12/18 EXERCISE LOG  Exercise Repetitions and Resistance Comments  UBE X 6 mins  90 RPMs   Pulleys  X 5 mins   RW4 Yellow 2x10   UE Ranger 2x10   Flexion/ CW/ CCW   Wall slides  Flexion,  x 10 reps,HABD/ADD x5   Bicep curl 3#x15, 4# x15   Tricep Push down XTS blue  3x15    Blank cell = exercise not performed today   PATIENT EDUCATION: Education details: Plan of care, prognosis, objective measurements, healing, and goals for therapy Person educated: Patient Education method: Explanation Education comprehension: verbalized understanding  HOME EXERCISE PROGRAM: Wall slides were added to his HEP  ASSESSMENT:  CLINICAL IMPRESSION: Pt arrived today doing fairly well and was  able to perform AROM, AAROM, and light close to the body strengthening. Pt did well with all exs with mainly fatigue end of session.      OBJECTIVE IMPAIRMENTS: decreased ROM, decreased strength, impaired UE functional use, and pain.   ACTIVITY LIMITATIONS: carrying, lifting, and reach over  head  PARTICIPATION LIMITATIONS: meal prep, cleaning, shopping, and occupation  PERSONAL FACTORS: 3+ comorbidities: Brachial plexus injury, chronic low back pain, history of left rotator cuff repair  are also affecting patient's functional outcome.   REHAB POTENTIAL: Good  CLINICAL DECISION MAKING: Evolving/moderate complexity  EVALUATION COMPLEXITY: Moderate   GOALS: Goals reviewed with patient? Yes  SHORT TERM GOALS: Target date: 03/23/22  Patient will be independent with his initial HEP. Baseline: Goal status: INITIAL  2.  Patient will report being able to lay supine without his familiar right shoulder pain exceeding 4/10. Baseline:  Goal status: INITIAL  3.  Patient will be able to lift at least 1 pound overhead with his right upper extremity for improved function with household activities. Baseline:  Goal status: INITIAL  LONG TERM GOALS: Target date: 04/13/22  Patient will be independent with his advanced HEP. Baseline:  Goal status: INITIAL  2.  Patient will be able to lift at least 5 pounds with his right upper extremity for improved function with household activities. Baseline:  Goal status: INITIAL  3.  Patient will be able to single-leg supine without his familiar right shoulder pain exceeding 2/10 for improved function with sleep. Baseline:  Goal status: INITIAL  PLAN:  PT FREQUENCY: 1-2x/week  PT DURATION: 6 weeks  PLANNED INTERVENTIONS: Therapeutic exercises, Therapeutic activity, Neuromuscular re-education, Patient/Family education, Self Care, Joint mobilization, Electrical stimulation, Cryotherapy, Moist heat, Vasopneumatic device, Manual therapy, and Re-evaluation  PLAN FOR NEXT SESSION: Wall slides, resisted rows, pull downs, and right upper extremity strengthening with modalities as needed   Eric Nees,CHRIS, PTA 03/13/2022, 9:08 AM

## 2022-03-17 ENCOUNTER — Ambulatory Visit: Payer: 59 | Attending: Physician Assistant | Admitting: Physical Therapy

## 2022-03-17 ENCOUNTER — Encounter: Payer: Self-pay | Admitting: Physical Therapy

## 2022-03-17 DIAGNOSIS — M25511 Pain in right shoulder: Secondary | ICD-10-CM | POA: Diagnosis not present

## 2022-03-17 DIAGNOSIS — M6281 Muscle weakness (generalized): Secondary | ICD-10-CM | POA: Diagnosis present

## 2022-03-17 NOTE — Therapy (Signed)
OUTPATIENT PHYSICAL THERAPY SHOULDER TREATMENT   Patient Name: Ryan Rojas MRN: [id number] DOB:1965/03/21, 57 y.o., male Today's Date: 03/17/2022  END OF SESSION:  PT End of Session - 03/17/22 0812     Visit Number 3    Number of Visits 12    Date for PT Re-Evaluation 05/08/22    PT Start Time 0817    PT Stop Time 0852    PT Time Calculation (min) 35 min    Activity Tolerance Patient tolerated treatment well;Patient limited by fatigue    Behavior During Therapy [facility] for tasks assessed/performed            Past Medical History:  Diagnosis Date   Back pain    Hyperlipidemia    Past Surgical History:  Procedure Laterality Date   COLONOSCOPY WITH PROPOFOL N/A 02/24/2022   Procedure: COLONOSCOPY WITH PROPOFOL;  Surgeon: Lanelle Bal, DO;  [facility]: [facility];  Service: Endoscopy;  Laterality: N/A;  10:30 AM, moved to 12/12 at 12:30   ROTATOR CUFF REPAIR Left 07/10/2021   Patient Active Problem List   Diagnosis Date Noted   Low back pain without sciatica 12/31/2021   Tendinopathy of right biceps tendon 12/23/2021   Carpal tunnel syndrome of right wrist 12/09/2021   Chronic midline low back pain with right-sided sciatica 12/09/2021   Paresthesia 12/09/2021   S/P left rotator cuff repair 12/09/2021   Nontraumatic incomplete tear of right rotator cuff 12/09/2021   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   Opiate analgesic use agreement exists 11/05/2021   Gasping for breath 11/05/2021   At risk for central sleep apnea 11/05/2021   Risk factors for obstructive sleep apnea 11/05/2021   Sleep related headaches 11/05/2021   Right hand paresthesia 09/11/2021   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   Obstructive sleep apnea 09/11/2021   Nontraumatic complete tear of left rotator cuff 05/28/2021   Impingement syndrome of left shoulder 05/28/2021   Tendinopathy of left biceps tendon 05/28/2021   Arm paresthesia, left 01/02/2021   Chronic right  shoulder pain 01/02/2021   Other fatigue 01/02/2021   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   Neuropathic pain 09/30/2020   Left arm weakness 09/17/2020   Left arm pain 09/17/2020   Neck pain on left side 09/17/2020   Cubital tunnel syndrome on left 08/16/2020   Intractable neuropathic pain of lumbosacral origin 05/20/2020   Combined arterial insufficiency and corporo-venous occlusive erectile dysfunction 09/27/2019   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   Impotence 01/18/2018   Hyperlipidemia 07/16/2017   PCP: Mechele Claude, MD  REFERRING PROVIDER: Cristie Hem, PA-C  REFERRING DIAG: S/P arthroscopy of right shoulder   THERAPY DIAG:  Acute pain of right shoulder  Muscle weakness (generalized)  Rationale for Evaluation and Treatment: Rehabilitation  ONSET DATE: November 2023  SUBJECTIVE:  SUBJECTIVE STATEMENT: Reports his shoulder is okay but has some throbbing in R shoulder.  PERTINENT HISTORY: Brachial plexus injury, chronic low back pain, history of left rotator cuff repair  PAIN: No rating provided  PRECAUTIONS: Shoulder  PATIENT GOALS: improved mobility, and use of his right arm  NEXT MD VISIT: January 2024  OBJECTIVE:   PATIENT SURVEYS:  FOTO 53.85  UPPER EXTREMITY ROM:   Active ROM Right eval Left eval  Shoulder flexion 130; slow and required LLE for controlled descent 136  Shoulder extension    Shoulder abduction 140; slow and required LLE for controlled descent 114  Shoulder adduction    Shoulder internal rotation To L4   Shoulder external rotation To T2   Elbow flexion    Elbow extension    Wrist flexion    Wrist extension    Wrist ulnar deviation    Wrist radial deviation    Wrist pronation    Wrist supination    (Blank rows = not tested)  TODAY'S TREATMENT:                                                                                                                                          DATE:                                    1/2 EXERCISE LOG  Exercise Repetitions and Resistance Comments  UBE X8 mins  90 RPMs   Pulleys  X 5 mins   RW4 Yellow 2x10   Short lever shoulder flexion X20 reps   Short lever shoulder abd X20 reps   Wall slides  Flexion,  x 10 reps   Bicep curl 4# x30 reps   Tricep Push down XTS blue  3x15   Shoulder protraction Supine x20 reps AAROM   Shoulder flexion Supine within 90- OH range x20 reps   Small range wall pushups X30 reps    Blank cell = exercise not performed today   PATIENT EDUCATION: Education details: Plan of care, prognosis, objective measurements, healing, and goals for therapy Person educated: Patient Education method: Explanation Education comprehension: verbalized understanding  HOME EXERCISE PROGRAM: Wall slides were added to his HEP  ASSESSMENT:  CLINICAL IMPRESSION:   Patient presented in clinic with reports of no current complaint of R shoulder. Patient able to tolerate therex with light AROM and resistance training. Patient required mod assist frequently with antigravity shoulder flexion and abduction in standing and supine. Patient reported fatigue in R shoulder by end of treatment.  OBJECTIVE IMPAIRMENTS: decreased ROM, decreased strength, impaired UE functional use, and pain.   ACTIVITY LIMITATIONS: carrying, lifting, and reach over head  PARTICIPATION LIMITATIONS: meal prep, cleaning, shopping, and occupation  PERSONAL FACTORS: 3+ comorbidities: Brachial plexus injury, chronic low back pain, history of left rotator cuff repair  are also affecting patient's functional outcome.   REHAB POTENTIAL: Good  CLINICAL DECISION MAKING: Evolving/moderate complexity  EVALUATION COMPLEXITY: Moderate  GOALS: Goals reviewed with patient? Yes  SHORT TERM GOALS: Target date: 03/23/22  Patient will be  independent with his initial HEP. Baseline: Goal status: INITIAL  2.  Patient will report being able to lay supine without his familiar right shoulder pain exceeding 4/10. Baseline:  Goal status: INITIAL  3.  Patient will be able to lift at least 1 pound overhead with his right upper extremity for improved function with household activities. Baseline:  Goal status: INITIAL  LONG TERM GOALS: Target date: 04/13/22  Patient will be independent with his advanced HEP. Baseline:  Goal status: INITIAL  2.  Patient will be able to lift at least 5 pounds with his right upper extremity for improved function with household activities. Baseline:  Goal status: INITIAL  3.  Patient will be able to single-leg supine without his familiar right shoulder pain exceeding 2/10 for improved function with sleep. Baseline:  Goal status: INITIAL  PLAN:  PT FREQUENCY: 1-2x/week  PT DURATION: 6 weeks  PLANNED INTERVENTIONS: Therapeutic exercises, Therapeutic activity, Neuromuscular re-education, Patient/Family education, Self Care, Joint mobilization, Electrical stimulation, Cryotherapy, Moist heat, Vasopneumatic device, Manual therapy, and Re-evaluation  PLAN FOR NEXT SESSION: Wall slides, resisted rows, pull downs, and right upper extremity strengthening with modalities as needed  Standley Brooking, PTA 03/17/2022, 9:06 AM

## 2022-03-20 ENCOUNTER — Encounter: Payer: 59 | Admitting: *Deleted

## 2022-03-21 ENCOUNTER — Other Ambulatory Visit: Payer: Self-pay | Admitting: Family Medicine

## 2022-03-21 DIAGNOSIS — J40 Bronchitis, not specified as acute or chronic: Secondary | ICD-10-CM

## 2022-03-23 ENCOUNTER — Ambulatory Visit: Payer: 59

## 2022-03-23 DIAGNOSIS — M25511 Pain in right shoulder: Secondary | ICD-10-CM | POA: Diagnosis not present

## 2022-03-23 DIAGNOSIS — M6281 Muscle weakness (generalized): Secondary | ICD-10-CM

## 2022-03-23 NOTE — Therapy (Signed)
OUTPATIENT PHYSICAL THERAPY SHOULDER TREATMENT   Patient Name: Ryan Rojas MRN: [id number] DOB:July 04, 1965, 57 y.o., male Today's Date: 03/23/2022  END OF SESSION:  PT End of Session - 03/23/22 0818     Visit Number 4    Number of Visits 12    Date for PT Re-Evaluation 05/08/22    PT Start Time 0815    PT Stop Time 0858    PT Time Calculation (min) 43 min    Activity Tolerance Patient tolerated treatment well    Behavior During Therapy [facility] for tasks assessed/performed            Past Medical History:  Diagnosis Date   Back pain    Hyperlipidemia    Past Surgical History:  Procedure Laterality Date   COLONOSCOPY WITH PROPOFOL N/A 02/24/2022   Procedure: COLONOSCOPY WITH PROPOFOL;  Surgeon: Lanelle Bal, DO;  [facility]: [facility];  Service: Endoscopy;  Laterality: N/A;  10:30 AM, moved to 12/12 at 12:30   ROTATOR CUFF REPAIR Left 07/10/2021   Patient Active Problem List   Diagnosis Date Noted   Low back pain without sciatica 12/31/2021   Tendinopathy of right biceps tendon 12/23/2021   Carpal tunnel syndrome of right wrist 12/09/2021   Chronic midline low back pain with right-sided sciatica 12/09/2021   Paresthesia 12/09/2021   S/P left rotator cuff repair 12/09/2021   Nontraumatic incomplete tear of right rotator cuff 12/09/2021   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   Opiate analgesic use agreement exists 11/05/2021   Gasping for breath 11/05/2021   At risk for central sleep apnea 11/05/2021   Risk factors for obstructive sleep apnea 11/05/2021   Sleep related headaches 11/05/2021   Right hand paresthesia 09/11/2021   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   Obstructive sleep apnea 09/11/2021   Nontraumatic complete tear of left rotator cuff 05/28/2021   Impingement syndrome of left shoulder 05/28/2021   Tendinopathy of left biceps tendon 05/28/2021   Arm paresthesia, left 01/02/2021   Chronic right shoulder pain 01/02/2021    Other fatigue 01/02/2021   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   Neuropathic pain 09/30/2020   Left arm weakness 09/17/2020   Left arm pain 09/17/2020   Neck pain on left side 09/17/2020   Cubital tunnel syndrome on left 08/16/2020   Intractable neuropathic pain of lumbosacral origin 05/20/2020   Combined arterial insufficiency and corporo-venous occlusive erectile dysfunction 09/27/2019   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   Impotence 01/18/2018   Hyperlipidemia 07/16/2017   PCP: Mechele Claude, MD  REFERRING PROVIDER: Cristie Hem, PA-C  REFERRING DIAG: S/P arthroscopy of right shoulder   THERAPY DIAG:  Acute pain of right shoulder  Muscle weakness (generalized)  Rationale for Evaluation and Treatment: Rehabilitation  ONSET DATE: November 2023  SUBJECTIVE:  SUBJECTIVE STATEMENT: Patient reports that his shoulder still feels about the same as it has been as it still hurts at night and he still has to use his other hand to help it move.   PERTINENT HISTORY: Brachial plexus injury, chronic low back pain, history of left rotator cuff repair  PAIN: No rating provided  PRECAUTIONS: Shoulder  PATIENT GOALS: improved mobility, and use of his right arm  NEXT MD VISIT: January 2024  OBJECTIVE:   PATIENT SURVEYS:  FOTO 53.85  UPPER EXTREMITY ROM:   Active ROM Right eval Left eval  Shoulder flexion 130; slow and required LLE for controlled descent 136  Shoulder extension    Shoulder abduction 140; slow and required LLE for controlled descent 114  Shoulder adduction    Shoulder internal rotation To L4   Shoulder external rotation To T2   Elbow flexion    Elbow extension    Wrist flexion    Wrist extension    Wrist ulnar deviation    Wrist radial deviation    Wrist pronation    Wrist supination     (Blank rows = not tested)  TODAY'S TREATMENT:                                                                                                                                         DATE:                                    1/8 EXERCISE LOG  Exercise Repetitions and Resistance Comments  UBE  X10 minutes @ 90 RPM   Ball roll out  3 minutes AA shoulder flexion   Resisted ER  Yellow t-band x 20 reps    Resisted IR  Yellow t-band x 20 reps   Wall slides (flexion and overhead arc)  30 reps each    Resisted pull down Blue XTS x 2 minutes   Resisted ADD  Blue XTS x 2 minutes   Bicep curl 10# x 2 minutes    Pulleys (flexion) 2 minutes AAROM   Resisted pull apart  Red t-band x 30 reps     Blank cell = exercise not performed today                                    1/2 EXERCISE LOG  Exercise Repetitions and Resistance Comments  UBE X8 mins  90 RPMs   Pulleys  X 5 mins   RW4 Yellow 2x10   Short lever shoulder flexion X20 reps   Short lever shoulder abd X20 reps   Wall slides  Flexion,  x 10 reps   Bicep curl 4# x30 reps   Tricep Push down XTS blue  3x15   Shoulder protraction Supine  x20 reps AAROM   Shoulder flexion Supine within 90- OH range x20 reps   Small range wall pushups X30 reps    Blank cell = exercise not performed today   PATIENT EDUCATION: Education details: Plan of care, prognosis, objective measurements, healing, and goals for therapy Person educated: Patient Education method: Explanation Education comprehension: verbalized understanding  HOME EXERCISE PROGRAM: Wall slides were added to his HEP  ASSESSMENT:  CLINICAL IMPRESSION:  Patient was introduced to multiple new interventions for improved right shoulder mobility and strength with moderate difficulty and fatigue.  He required minimal cueing with today's interventions for proper pacing and for brief rest.  To avoid a significant increase in upper extremity fatigue which would result in a deterioration in  exercise performance.  He reported no significant pain or discomfort with any of today's interventions.  He reported that his shoulder felt about the same upon the conclusion of treatment.  He continues to require skilled physical therapy to address his remaining impairments to maximize his functional mobility.  OBJECTIVE IMPAIRMENTS: decreased ROM, decreased strength, impaired UE functional use, and pain.   ACTIVITY LIMITATIONS: carrying, lifting, and reach over head  PARTICIPATION LIMITATIONS: meal prep, cleaning, shopping, and occupation  PERSONAL FACTORS: 3+ comorbidities: Brachial plexus injury, chronic low back pain, history of left rotator cuff repair  are also affecting patient's functional outcome.   REHAB POTENTIAL: Good  CLINICAL DECISION MAKING: Evolving/moderate complexity  EVALUATION COMPLEXITY: Moderate  GOALS: Goals reviewed with patient? Yes  SHORT TERM GOALS: Target date: 03/23/22  Patient will be independent with his initial HEP. Baseline: Goal status: INITIAL  2.  Patient will report being able to lay supine without his familiar right shoulder pain exceeding 4/10. Baseline:  Goal status: INITIAL  3.  Patient will be able to lift at least 1 pound overhead with his right upper extremity for improved function with household activities. Baseline:  Goal status: INITIAL  LONG TERM GOALS: Target date: 04/13/22  Patient will be independent with his advanced HEP. Baseline:  Goal status: INITIAL  2.  Patient will be able to lift at least 5 pounds with his right upper extremity for improved function with household activities. Baseline:  Goal status: INITIAL  3.  Patient will be able to single-leg supine without his familiar right shoulder pain exceeding 2/10 for improved function with sleep. Baseline:  Goal status: INITIAL  PLAN:  PT FREQUENCY: 1-2x/week  PT DURATION: 6 weeks  PLANNED INTERVENTIONS: Therapeutic exercises, Therapeutic activity, Neuromuscular  re-education, Patient/Family education, Self Care, Joint mobilization, Electrical stimulation, Cryotherapy, Moist heat, Vasopneumatic device, Manual therapy, and Re-evaluation  PLAN FOR NEXT SESSION: Wall slides, resisted rows, pull downs, and right upper extremity strengthening with modalities as needed  Granville Lewis, PT 03/23/2022, 9:16 AM

## 2022-03-26 ENCOUNTER — Encounter: Payer: Self-pay | Admitting: Family Medicine

## 2022-03-26 ENCOUNTER — Ambulatory Visit: Payer: 59

## 2022-03-26 ENCOUNTER — Ambulatory Visit (INDEPENDENT_AMBULATORY_CARE_PROVIDER_SITE_OTHER): Payer: 59 | Admitting: Family Medicine

## 2022-03-26 VITALS — HR 92 | Temp 97.5°F | Ht 72.0 in | Wt 250.0 lb

## 2022-03-26 DIAGNOSIS — Z1321 Encounter for screening for nutritional disorder: Secondary | ICD-10-CM | POA: Diagnosis not present

## 2022-03-26 DIAGNOSIS — E782 Mixed hyperlipidemia: Secondary | ICD-10-CM

## 2022-03-26 DIAGNOSIS — R7303 Prediabetes: Secondary | ICD-10-CM

## 2022-03-26 DIAGNOSIS — M25511 Pain in right shoulder: Secondary | ICD-10-CM | POA: Diagnosis not present

## 2022-03-26 DIAGNOSIS — Z125 Encounter for screening for malignant neoplasm of prostate: Secondary | ICD-10-CM

## 2022-03-26 DIAGNOSIS — J40 Bronchitis, not specified as acute or chronic: Secondary | ICD-10-CM

## 2022-03-26 DIAGNOSIS — M6281 Muscle weakness (generalized): Secondary | ICD-10-CM

## 2022-03-26 LAB — BAYER DCA HB A1C WAIVED: HB A1C (BAYER DCA - WAIVED): 5.9 % — ABNORMAL HIGH (ref 4.8–5.6)

## 2022-03-26 MED ORDER — ALBUTEROL SULFATE HFA 108 (90 BASE) MCG/ACT IN AERS: INHALATION_SPRAY | RESPIRATORY_TRACT | 11 refills | Status: DC

## 2022-03-26 MED ORDER — ATORVASTATIN CALCIUM 40 MG PO TABS: 40.0000 mg | ORAL_TABLET | Freq: Every day | ORAL | 3 refills | Status: DC

## 2022-03-26 NOTE — Therapy (Signed)
OUTPATIENT PHYSICAL THERAPY SHOULDER TREATMENT   Patient Name: Ryan Rojas MRN: [id number] DOB:[birth year], 57 y.o., male Today's Date: 03/26/2022  END OF SESSION:  PT End of Session - 03/26/22 1445     Visit Number 5    Number of Visits 12    Date for PT Re-Evaluation 05/08/22    PT Start Time 3893   Patient arrived late to his appointment.   PT Stop Time 1515    PT Time Calculation (min) 32 min    Activity Tolerance Patient tolerated treatment well    Behavior During Therapy WFL for tasks assessed/performed            Past Medical History:  Diagnosis Date   Back pain    Hyperlipidemia    Past Surgical History:  Procedure Laterality Date   COLONOSCOPY WITH PROPOFOL N/A 02/24/2022   Procedure: COLONOSCOPY WITH PROPOFOL;  Surgeon: Eloise Harman, DO;  Location: [facility];  Service: Endoscopy;  Laterality: N/A;  10:30 AM, moved to 12/12 at 12:30   ROTATOR CUFF REPAIR Left 07/10/2021   Patient Active Problem List   Diagnosis Date Noted   Low back pain without sciatica 12/31/2021   Tendinopathy of right biceps tendon 12/23/2021   Carpal tunnel syndrome of right wrist 12/09/2021   Chronic midline low back pain with right-sided sciatica 12/09/2021   Paresthesia 12/09/2021   S/P left rotator cuff repair 12/09/2021   Nontraumatic incomplete tear of right rotator cuff 12/09/2021   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   Opiate analgesic use agreement exists 11/05/2021   Gasping for breath 11/05/2021   At risk for central sleep apnea 11/05/2021   Risk factors for obstructive sleep apnea 11/05/2021   Sleep related headaches 11/05/2021   Right hand paresthesia 09/11/2021   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   Obstructive sleep apnea 09/11/2021   Nontraumatic complete tear of left rotator cuff 05/28/2021   Impingement syndrome of left shoulder 05/28/2021   Tendinopathy of left biceps tendon 05/28/2021   Arm paresthesia, left 01/02/2021    Chronic right shoulder pain 01/02/2021   Other fatigue 01/02/2021   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   Neuropathic pain 09/30/2020   Left arm weakness 09/17/2020   Left arm pain 09/17/2020   Neck pain on left side 09/17/2020   Cubital tunnel syndrome on left 08/16/2020   Intractable neuropathic pain of lumbosacral origin 05/20/2020   Combined arterial insufficiency and corporo-venous occlusive erectile dysfunction 09/27/2019   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   Impotence 01/18/2018   Hyperlipidemia 07/16/2017   PCP: Claretta Fraise, MD  REFERRING PROVIDER: Aundra Dubin, PA-C  REFERRING DIAG: S/P arthroscopy of right shoulder   THERAPY DIAG:  Acute pain of right shoulder  Muscle weakness (generalized)  Rationale for Evaluation and Treatment: Rehabilitation  ONSET DATE: November 2023  SUBJECTIVE:  SUBJECTIVE STATEMENT: Patient reports that his shoulder feels about the same as his shoulder still throbs at night.   PERTINENT HISTORY: Brachial plexus injury, chronic low back pain, history of left rotator cuff repair  PAIN: No rating provided  PRECAUTIONS: Shoulder  PATIENT GOALS: improved mobility, and use of his right arm  NEXT MD VISIT: January 2024  OBJECTIVE:   PATIENT SURVEYS:  FOTO 53.85  UPPER EXTREMITY ROM:   Active ROM Right eval Left eval  Shoulder flexion 130; slow and required LLE for controlled descent 136  Shoulder extension    Shoulder abduction 140; slow and required LLE for controlled descent 114  Shoulder adduction    Shoulder internal rotation To L4   Shoulder external rotation To T2   Elbow flexion    Elbow extension    Wrist flexion    Wrist extension    Wrist ulnar deviation    Wrist radial deviation    Wrist pronation    Wrist supination    (Blank rows = not  tested)  TODAY'S TREATMENT:                                                                                                                                         DATE:                                    1/11 EXERCISE LOG  Exercise Repetitions and Resistance Comments  UBE  X10 minutes @ 90 RPM    Resisted flexion  Green t-band x 20 reps    Resisted IR  Green t-band x 20 reps   Resisted pull down  Green t-band x 20 reps    Resisted ER  Green t-band x 20 reps    Resisted ABD  Yellow t-band x 15 reps    Wall slide (flexion and overhead arc)  2 minutes   AA cane flexion  10 reps    Shoulder flexion with resisted ABD  Red t-band x 20 reps     Blank cell = exercise not performed today                                    1/8 EXERCISE LOG  Exercise Repetitions and Resistance Comments  UBE  X10 minutes @ 90 RPM   Ball roll out  3 minutes AA shoulder flexion   Resisted ER  Yellow t-band x 20 reps    Resisted IR  Yellow t-band x 20 reps   Wall slides (flexion and overhead arc)  30 reps each    Resisted pull down Blue XTS x 2 minutes   Resisted ADD  Blue XTS x 2 minutes   Bicep curl 10# x 2 minutes    Pulleys (flexion)  2 minutes AAROM   Resisted pull apart  Red t-band x 30 reps     Blank cell = exercise not performed today                                    1/2 EXERCISE LOG  Exercise Repetitions and Resistance Comments  UBE X8 mins  90 RPMs   Pulleys  X 5 mins   RW4 Yellow 2x10   Short lever shoulder flexion X20 reps   Short lever shoulder abd X20 reps   Wall slides  Flexion,  x 10 reps   Bicep curl 4# x30 reps   Tricep Push down XTS blue  3x15   Shoulder protraction Supine x20 reps AAROM   Shoulder flexion Supine within 90- OH range x20 reps   Small range wall pushups X30 reps    Blank cell = exercise not performed today   PATIENT EDUCATION: Education details: Plan of care, prognosis, objective measurements, healing, and goals for therapy Person educated: Patient Education  method: Explanation Education comprehension: verbalized understanding  HOME EXERCISE PROGRAM: Wall slides were added to his HEP  ASSESSMENT:  CLINICAL IMPRESSION:  Patient was progressed with multiple new and familiar interventions with moderate difficulty for improved function reaching overhead. He required minimal cueing with shoulder flexion with resisted abduction for proper exercise performance. However, he reported that his shoulder felt better while performing this intervention compared to open chain shoulder flexion. He reported that his shoulder felt good upon the conclusion of treatment. He continues to require skilled physical therapy to address his remaining impairments to return to his prior level of function.   OBJECTIVE IMPAIRMENTS: decreased ROM, decreased strength, impaired UE functional use, and pain.   ACTIVITY LIMITATIONS: carrying, lifting, and reach over head  PARTICIPATION LIMITATIONS: meal prep, cleaning, shopping, and occupation  PERSONAL FACTORS: 3+ comorbidities: Brachial plexus injury, chronic low back pain, history of left rotator cuff repair  are also affecting patient's functional outcome.   REHAB POTENTIAL: Good  CLINICAL DECISION MAKING: Evolving/moderate complexity  EVALUATION COMPLEXITY: Moderate  GOALS: Goals reviewed with patient? Yes  SHORT TERM GOALS: Target date: 03/23/22  Patient will be independent with his initial HEP. Baseline: Goal status: INITIAL  2.  Patient will report being able to lay supine without his familiar right shoulder pain exceeding 4/10. Baseline:  Goal status: INITIAL  3.  Patient will be able to lift at least 1 pound overhead with his right upper extremity for improved function with household activities. Baseline:  Goal status: INITIAL  LONG TERM GOALS: Target date: 04/13/22  Patient will be independent with his advanced HEP. Baseline:  Goal status: INITIAL  2.  Patient will be able to lift at least 5 pounds  with his right upper extremity for improved function with household activities. Baseline:  Goal status: INITIAL  3.  Patient will be able to single-leg supine without his familiar right shoulder pain exceeding 2/10 for improved function with sleep. Baseline:  Goal status: INITIAL  PLAN:  PT FREQUENCY: 1-2x/week  PT DURATION: 6 weeks  PLANNED INTERVENTIONS: Therapeutic exercises, Therapeutic activity, Neuromuscular re-education, Patient/Family education, Self Care, Joint mobilization, Electrical stimulation, Cryotherapy, Moist heat, Vasopneumatic device, Manual therapy, and Re-evaluation  PLAN FOR NEXT SESSION: Wall slides, resisted rows, pull downs, and right upper extremity strengthening with modalities as needed  Granville Lewis, PT 03/26/2022, 5:19 PM

## 2022-03-26 NOTE — Progress Notes (Signed)
Subjective:  Patient ID: Ryan Rojas, male    DOB: 04/16/1965  Age: 57 y.o. MRN: [id number]  CC: Medical Management of Chronic Issues   HPI Ryan Rojas presents for  in for follow-up of elevated cholesterol. Doing well without complaints on current medication. Denies side effects of statin including myalgia and arthralgia and nausea. Currently no chest pain, shortness of breath or other cardiovascular related symptoms noted.  Dealing with cervical and lumbar radicular pain. Had left shoulder surgery last April. Right shoulder in November 2023 and having cervical fusion on Feb 1. Recentl had ESI lumbar with some relief of the right leg pain, but back still hurts      03/26/2022    2:03 PM 02/26/2022    1:54 PM 12/17/2021    4:35 PM  Depression screen PHQ 2/9  Decreased Interest 0 0 0  Down, Depressed, Hopeless 0 0 0  PHQ - 2 Score 0 0 0  Altered sleeping 2 2 3   Tired, decreased energy 0 1 0  Change in appetite 0 1 0  Feeling bad or failure about yourself  0 0 0  Trouble concentrating 0 0 0  Moving slowly or fidgety/restless 0 0 3  Suicidal thoughts 0 0 0  PHQ-9 Score 2 4 6   Difficult doing work/chores Somewhat difficult Not difficult at all Somewhat difficult    History [name] has a past medical history of Back pain and Hyperlipidemia.   He has a past surgical history that includes Rotator cuff repair (Left, 07/10/2021) and Colonoscopy with propofol (N/A, 02/24/2022).   His family history includes Asthma in his mother and sister; Diabetes in his sister.He reports that he has never smoked. He has never used smokeless tobacco. He reports current alcohol use of about 1.0 standard drink of alcohol per week. He reports that he does not use drugs.    ROS Review of Systems  Constitutional:  Negative for fever.  Respiratory:  Negative for shortness of breath.   Cardiovascular:  Negative for chest pain.  Musculoskeletal:  Positive for arthralgias, back pain and neck pain.  Skin:   Negative for rash.    Objective:  Pulse 92   Temp (!) 97.5 F (36.4 C)   Ht 6' (1.829 m)   Wt 250 lb (113.4 kg)   SpO2 98%   BMI 33.91 kg/m   BP Readings from Last 3 Encounters:  02/26/22 120/65  02/24/22 119/84  12/17/21 118/64    Wt Readings from Last 3 Encounters:  03/26/22 250 lb (113.4 kg)  02/26/22 250 lb 8 oz (113.6 kg)  02/24/22 240 lb (108.9 kg)     Physical Exam Vitals reviewed.  Constitutional:      Appearance: He is well-developed.  HENT:     Head: Normocephalic and atraumatic.     Right Ear: External ear normal.     Left Ear: External ear normal.     Mouth/Throat:     Pharynx: No oropharyngeal exudate or posterior oropharyngeal erythema.  Eyes:     Pupils: Pupils are equal, round, and reactive to light.  Cardiovascular:     Rate and Rhythm: Normal rate and regular rhythm.     Heart sounds: No murmur heard. Pulmonary:     Effort: No respiratory distress.     Breath sounds: Normal breath sounds.  Musculoskeletal:     Cervical back: Normal range of motion and neck supple.  Neurological:     Mental Status: He is alert and oriented to person, place, and time.  Assessment & Plan:   Ryan Rojas was seen today for medical management of chronic issues.  Diagnoses and all orders for this visit:  Mixed hyperlipidemia -     Lipid panel -     atorvastatin (LIPITOR) 40 MG tablet; Take 1 tablet (40 mg total) by mouth daily.  Prediabetes -     Bayer DCA Hb A1c Waived -     CBC with Differential/Platelet -     CMP14+EGFR  Encounter for vitamin deficiency screening -     VITAMIN D 25 Hydroxy (Vit-D Deficiency, Fractures)  Prostate cancer screening -     PSA, total and free  Bronchitis -     albuterol (VENTOLIN HFA) 108 (90 Base) MCG/ACT inhaler; TAKE 2 PUFFS BY MOUTH EVERY 6 HOURS AS NEEDED FOR WHEEZE OR SHORTNESS OF BREATH       I have discontinued Kavaughn Verno's gabapentin, meloxicam, traMADol, naproxen sodium, DULoxetine, and  benzonatate. I am also having him maintain his sildenafil, celecoxib, ondansetron, imipramine, bisacodyl, atorvastatin, and albuterol.  Allergies as of 03/26/2022       Reactions   Hydrocodone Itching   Oxycodone Hives        Medication List        Accurate as of March 26, 2022  2:39 PM. If you have any questions, ask your nurse or doctor.          STOP taking these medications    benzonatate 100 MG capsule Commonly known as: Best boy Stopped by: Claretta Fraise, MD   DULoxetine 60 MG capsule Commonly known as: CYMBALTA Stopped by: Claretta Fraise, MD   gabapentin 100 MG capsule Commonly known as: Neurontin Stopped by: Claretta Fraise, MD   meloxicam 15 MG tablet Commonly known as: Mobic Stopped by: Claretta Fraise, MD   naproxen sodium 220 MG tablet Commonly known as: ALEVE Stopped by: Claretta Fraise, MD   traMADol 50 MG tablet Commonly known as: ULTRAM Stopped by: Claretta Fraise, MD       TAKE these medications    albuterol 108 (90 Base) MCG/ACT inhaler Commonly known as: VENTOLIN HFA TAKE 2 PUFFS BY MOUTH EVERY 6 HOURS AS NEEDED FOR WHEEZE OR SHORTNESS OF BREATH   atorvastatin 40 MG tablet Commonly known as: LIPITOR Take 1 tablet (40 mg total) by mouth daily.   bisacodyl 5 MG EC tablet Commonly known as: DULCOLAX Take 5 mg by mouth daily as needed for moderate constipation.   celecoxib 100 MG capsule Commonly known as: CELEBREX TAKE 1 CAPSULE BY MOUTH 2 TIMES DAILY AS NEEDED.   imipramine 25 MG tablet Commonly known as: TOFRANIL TAKE 1 TABLET BY MOUTH EVERYDAY AT BEDTIME   ondansetron 4 MG tablet Commonly known as: Zofran Take 1 tablet (4 mg total) by mouth every 8 (eight) hours as needed for nausea or vomiting.   sildenafil 100 MG tablet Commonly known as: VIAGRA TAKE 0.5-1 TABLETS BY MOUTH DAILY AS NEEDED FOR ERECTILE DYSFUNCTION.         Follow-up: Return in about 6 months (around 09/24/2022).  Claretta Fraise, M.D.

## 2022-03-27 LAB — CMP14+EGFR
ALT: 14 IU/L (ref 0–44)
AST: 18 IU/L (ref 0–40)
Albumin/Globulin Ratio: 1.7 (ref 1.2–2.2)
Albumin: 4.2 g/dL (ref 3.8–4.9)
Alkaline Phosphatase: 93 IU/L (ref 44–121)
BUN/Creatinine Ratio: 10 (ref 9–20)
BUN: 10 mg/dL (ref 6–24)
Bilirubin Total: 0.3 mg/dL (ref 0.0–1.2)
CO2: 26 mmol/L (ref 20–29)
Calcium: 8.9 mg/dL (ref 8.7–10.2)
Chloride: 104 mmol/L (ref 96–106)
Creatinine, Ser: 1.01 mg/dL (ref 0.76–1.27)
Globulin, Total: 2.5 g/dL (ref 1.5–4.5)
Glucose: 89 mg/dL (ref 70–99)
Potassium: 4 mmol/L (ref 3.5–5.2)
Sodium: 143 mmol/L (ref 134–144)
Total Protein: 6.7 g/dL (ref 6.0–8.5)
eGFR: 88 mL/min/{1.73_m2} (ref >59)

## 2022-03-27 LAB — CBC WITH DIFFERENTIAL/PLATELET
Basophils Absolute: 0.1 10*3/uL (ref 0.0–0.2)
Basos: 1 %
EOS (ABSOLUTE): 0.2 10*3/uL (ref 0.0–0.4)
Eos: 2 %
Hematocrit: 39.2 % (ref 37.5–51.0)
Hemoglobin: 13 g/dL (ref 13.0–17.7)
Immature Grans (Abs): 0 10*3/uL (ref 0.0–0.1)
Immature Granulocytes: 0 %
Lymphocytes Absolute: 2.6 10*3/uL (ref 0.7–3.1)
Lymphs: 36 %
MCH: 27.5 pg (ref 26.6–33.0)
MCHC: 33.2 g/dL (ref 31.5–35.7)
MCV: 83 fL (ref 79–97)
Monocytes Absolute: 0.4 10*3/uL (ref 0.1–0.9)
Monocytes: 6 %
Neutrophils Absolute: 4.1 10*3/uL (ref 1.4–7.0)
Neutrophils: 55 %
Platelets: 237 10*3/uL (ref 150–450)
RBC: 4.72 x10E6/uL (ref 4.14–5.80)
RDW: 14.3 % (ref 11.6–15.4)
WBC: 7.4 10*3/uL (ref 3.4–10.8)

## 2022-03-27 LAB — LIPID PANEL
Chol/HDL Ratio: 3.6 ratio (ref 0.0–5.0)
Cholesterol, Total: 203 mg/dL — ABNORMAL HIGH (ref 100–199)
HDL: 56 mg/dL (ref >39)
LDL Chol Calc (NIH): 129 mg/dL — ABNORMAL HIGH (ref 0–99)
Triglycerides: 99 mg/dL (ref 0–149)
VLDL Cholesterol Cal: 18 mg/dL (ref 5–40)

## 2022-03-27 LAB — PSA, TOTAL AND FREE
PSA, Free Pct: 11.7 %
PSA, Free: 0.28 ng/mL
Prostate Specific Ag, Serum: 2.4 ng/mL (ref 0.0–4.0)

## 2022-03-27 LAB — VITAMIN D 25 HYDROXY (VIT D DEFICIENCY, FRACTURES): Vit D, 25-Hydroxy: 18.3 ng/mL — ABNORMAL LOW (ref 30.0–100.0)

## 2022-03-30 ENCOUNTER — Ambulatory Visit: Payer: 59

## 2022-03-30 DIAGNOSIS — M25511 Pain in right shoulder: Secondary | ICD-10-CM

## 2022-03-30 DIAGNOSIS — M6281 Muscle weakness (generalized): Secondary | ICD-10-CM

## 2022-03-30 NOTE — Therapy (Signed)
OUTPATIENT PHYSICAL THERAPY SHOULDER TREATMENT   Patient Name: Ryan Rojas MRN: [id number] DOB:Jun 21, 1965, 57 y.o., male Today's Date: 03/30/2022  END OF SESSION:  PT End of Session - 03/30/22 0817     Visit Number 6    Number of Visits 12    Date for PT Re-Evaluation 05/08/22    PT Start Time 0815    PT Stop Time 6967    PT Time Calculation (min) 43 min    Activity Tolerance Patient tolerated treatment well    Behavior During Therapy [facility] for tasks assessed/performed             Past Medical History:  Diagnosis Date   Back pain    Hyperlipidemia    Past Surgical History:  Procedure Laterality Date   COLONOSCOPY WITH PROPOFOL N/A 02/24/2022   Procedure: COLONOSCOPY WITH PROPOFOL;  Surgeon: Eloise Harman, DO;  Location: [facility];  Service: Endoscopy;  Laterality: N/A;  10:30 AM, moved to 12/12 at 12:30   ROTATOR CUFF REPAIR Left 07/10/2021   Patient Active Problem List   Diagnosis Date Noted   Low back pain without sciatica 12/31/2021   Tendinopathy of right biceps tendon 12/23/2021   Carpal tunnel syndrome of right wrist 12/09/2021   Chronic midline low back pain with right-sided sciatica 12/09/2021   Paresthesia 12/09/2021   S/P left rotator cuff repair 12/09/2021   Nontraumatic incomplete tear of right rotator cuff 12/09/2021   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   Opiate analgesic use agreement exists 11/05/2021   Gasping for breath 11/05/2021   At risk for central sleep apnea 11/05/2021   Risk factors for obstructive sleep apnea 11/05/2021   Sleep related headaches 11/05/2021   Right hand paresthesia 09/11/2021   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   Obstructive sleep apnea 09/11/2021   Nontraumatic complete tear of left rotator cuff 05/28/2021   Impingement syndrome of left shoulder 05/28/2021   Tendinopathy of left biceps tendon 05/28/2021   Arm paresthesia, left 01/02/2021   Chronic right shoulder pain 01/02/2021    Other fatigue 01/02/2021   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   Neuropathic pain 09/30/2020   Left arm weakness 09/17/2020   Left arm pain 09/17/2020   Neck pain on left side 09/17/2020   Cubital tunnel syndrome on left 08/16/2020   Intractable neuropathic pain of lumbosacral origin 05/20/2020   Combined arterial insufficiency and corporo-venous occlusive erectile dysfunction 09/27/2019   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   Impotence 01/18/2018   Hyperlipidemia 07/16/2017   PCP: Claretta Fraise, MD  REFERRING PROVIDER: Aundra Dubin, PA-C  REFERRING DIAG: S/P arthroscopy of right shoulder   THERAPY DIAG:  Acute pain of right shoulder  Muscle weakness (generalized)  Rationale for Evaluation and Treatment: Rehabilitation  ONSET DATE: November 2023  SUBJECTIVE:  SUBJECTIVE STATEMENT: Patient reports that his shoulder feels about the same. He notes that his shoulder was really hurting the day after his last appointment, but it is ok.   PERTINENT HISTORY: Brachial plexus injury, chronic low back pain, history of left rotator cuff repair  PAIN: No rating provided  PRECAUTIONS: Shoulder  PATIENT GOALS: improved mobility, and use of his right arm  NEXT MD VISIT: January 2024  OBJECTIVE:   PATIENT SURVEYS:  FOTO 53.85  UPPER EXTREMITY ROM:   Active ROM Right eval Left eval  Shoulder flexion 130; slow and required LLE for controlled descent 136  Shoulder extension    Shoulder abduction 140; slow and required LLE for controlled descent 114  Shoulder adduction    Shoulder internal rotation To L4   Shoulder external rotation To T2   Elbow flexion    Elbow extension    Wrist flexion    Wrist extension    Wrist ulnar deviation    Wrist radial deviation    Wrist pronation    Wrist supination     (Blank rows = not tested)  TODAY'S TREATMENT:                                                                                                                                         DATE:                                    1/15 EXERCISE LOG  Exercise Repetitions and Resistance Comments  UBE X10 minutes @ 90 RPM   Resisted shoulder extension Green t-band x 25 reps    Resisted IR  Green t-band x 25 reps    Resisted flexion  Green t-band x 25 reps    Resisted ER  Green t-band x 25 reps    Shoulder flexion with resisted ABD Red t-band x 20 reps    AROM shoulder flexion  12 reps    Supine cane press up  5# x 20 reps    SL shoulder ABD 2 minutes   Wall slide 3 minutes   Ball circles  2 minutes @ 90 degrees flexion   Resisted ADD  Blue XTS x 2 minutes   Resisted pull down  Blue XTS x 20 reps     Blank cell = exercise not performed today                                    1/11 EXERCISE LOG  Exercise Repetitions and Resistance Comments  UBE  X10 minutes @ 90 RPM    Resisted flexion  Green t-band x 20 reps    Resisted IR  Green t-band x 20 reps   Resisted pull down  Green t-band x 20 reps  Resisted ER  Green t-band x 20 reps    Resisted ABD  Yellow t-band x 15 reps    Wall slide (flexion and overhead arc)  2 minutes   AA cane flexion  10 reps    Shoulder flexion with resisted ABD  Red t-band x 20 reps     Blank cell = exercise not performed today                                    1/8 EXERCISE LOG  Exercise Repetitions and Resistance Comments  UBE  X10 minutes @ 90 RPM   Ball roll out  3 minutes AA shoulder flexion   Resisted ER  Yellow t-band x 20 reps    Resisted IR  Yellow t-band x 20 reps   Wall slides (flexion and overhead arc)  30 reps each    Resisted pull down Blue XTS x 2 minutes   Resisted ADD  Blue XTS x 2 minutes   Bicep curl 10# x 2 minutes    Pulleys (flexion) 2 minutes AAROM   Resisted pull apart  Red t-band x 30 reps     Blank cell = exercise not performed  today   PATIENT EDUCATION: Education details: Plan of care, prognosis, objective measurements, healing, and goals for therapy Person educated: Patient Education method: Explanation Education comprehension: verbalized understanding  HOME EXERCISE PROGRAM: Wall slides were added to his HEP  ASSESSMENT:  CLINICAL IMPRESSION:  Patient was introduced to side lying shoulder abduction for improved shoulder mobility. He required minimal cueing with today's interventions for proper exercise performance. He continues to exhibit increased difficulty with open chain shoulder range of motion. He reported that his shoulder felt about the same upon the conclusion of treatment. He continues to require skilled physical therapy to address his remaining impairments to maximize his functional mobility.   OBJECTIVE IMPAIRMENTS: decreased ROM, decreased strength, impaired UE functional use, and pain.   ACTIVITY LIMITATIONS: carrying, lifting, and reach over head  PARTICIPATION LIMITATIONS: meal prep, cleaning, shopping, and occupation  PERSONAL FACTORS: 3+ comorbidities: Brachial plexus injury, chronic low back pain, history of left rotator cuff repair  are also affecting patient's functional outcome.   REHAB POTENTIAL: Good  CLINICAL DECISION MAKING: Evolving/moderate complexity  EVALUATION COMPLEXITY: Moderate  GOALS: Goals reviewed with patient? Yes  SHORT TERM GOALS: Target date: 03/23/22  Patient will be independent with his initial HEP. Baseline: Goal status: INITIAL  2.  Patient will report being able to lay supine without his familiar right shoulder pain exceeding 4/10. Baseline:  Goal status: INITIAL  3.  Patient will be able to lift at least 1 pound overhead with his right upper extremity for improved function with household activities. Baseline:  Goal status: INITIAL  LONG TERM GOALS: Target date: 04/13/22  Patient will be independent with his advanced HEP. Baseline:  Goal  status: INITIAL  2.  Patient will be able to lift at least 5 pounds with his right upper extremity for improved function with household activities. Baseline:  Goal status: INITIAL  3.  Patient will be able to single-leg supine without his familiar right shoulder pain exceeding 2/10 for improved function with sleep. Baseline:  Goal status: INITIAL  PLAN:  PT FREQUENCY: 1-2x/week  PT DURATION: 6 weeks  PLANNED INTERVENTIONS: Therapeutic exercises, Therapeutic activity, Neuromuscular re-education, Patient/Family education, Self Care, Joint mobilization, Electrical stimulation, Cryotherapy, Moist heat, Vasopneumatic device, Manual therapy, and  Re-evaluation  PLAN FOR NEXT SESSION: Wall slides, resisted rows, pull downs, and right upper extremity strengthening with modalities as needed  Darlin Coco, PT 03/30/2022, 11:09 AM

## 2022-04-02 ENCOUNTER — Encounter: Payer: Self-pay | Admitting: Physical Therapy

## 2022-04-02 ENCOUNTER — Ambulatory Visit: Payer: 59 | Admitting: Physical Therapy

## 2022-04-02 DIAGNOSIS — M25511 Pain in right shoulder: Secondary | ICD-10-CM

## 2022-04-02 DIAGNOSIS — M6281 Muscle weakness (generalized): Secondary | ICD-10-CM

## 2022-04-02 NOTE — Therapy (Signed)
OUTPATIENT PHYSICAL THERAPY SHOULDER TREATMENT   Patient Name: Ryan Rojas MRN: [id number] DOB:[birth year], 57 y.o., male Today's Date: 04/02/2022  END OF SESSION:  PT End of Session - 04/02/22 1116     Visit Number 7    Number of Visits 12    Date for PT Re-Evaluation 05/08/22    PT Start Time 1031    PT Stop Time 1110    PT Time Calculation (min) 39 min    Activity Tolerance Patient limited by fatigue    Behavior During Therapy [facility] for tasks assessed/performed            Past Medical History:  Diagnosis Date   Back pain    Hyperlipidemia    Past Surgical History:  Procedure Laterality Date   COLONOSCOPY WITH PROPOFOL N/A 02/24/2022   Procedure: COLONOSCOPY WITH PROPOFOL;  Surgeon: Eloise Harman, DO;  [facility]: [facility];  Service: Endoscopy;  Laterality: N/A;  10:30 AM, moved to 12/12 at 12:30   ROTATOR CUFF REPAIR Left 07/10/2021   Patient Active Problem List   Diagnosis Date Noted   Low back pain without sciatica 12/31/2021   Tendinopathy of right biceps tendon 12/23/2021   Carpal tunnel syndrome of right wrist 12/09/2021   Chronic midline low back pain with right-sided sciatica 12/09/2021   Paresthesia 12/09/2021   S/P left rotator cuff repair 12/09/2021   Nontraumatic incomplete tear of right rotator cuff 12/09/2021   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   Opiate analgesic use agreement exists 11/05/2021   Gasping for breath 11/05/2021   At risk for central sleep apnea 11/05/2021   Risk factors for obstructive sleep apnea 11/05/2021   Sleep related headaches 11/05/2021   Right hand paresthesia 09/11/2021   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   Obstructive sleep apnea 09/11/2021   Nontraumatic complete tear of left rotator cuff 05/28/2021   Impingement syndrome of left shoulder 05/28/2021   Tendinopathy of left biceps tendon 05/28/2021   Arm paresthesia, left 01/02/2021   Chronic right shoulder pain 01/02/2021   Other  fatigue 01/02/2021   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   Neuropathic pain 09/30/2020   Left arm weakness 09/17/2020   Left arm pain 09/17/2020   Neck pain on left side 09/17/2020   Cubital tunnel syndrome on left 08/16/2020   Intractable neuropathic pain of lumbosacral origin 05/20/2020   Combined arterial insufficiency and corporo-venous occlusive erectile dysfunction 09/27/2019   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   Impotence 01/18/2018   Hyperlipidemia 07/16/2017   PCP: Claretta Fraise, MD  REFERRING PROVIDER: Aundra Dubin, PA-C  REFERRING DIAG: S/P arthroscopy of right shoulder   THERAPY DIAG:  Acute pain of right shoulder  Muscle weakness (generalized)  Rationale for Evaluation and Treatment: Rehabilitation  ONSET DATE: November 2023  SUBJECTIVE:  SUBJECTIVE STATEMENT: No new complaints.  PERTINENT HISTORY: Brachial plexus injury, chronic low back pain, history of left rotator cuff repair  PAIN: No rating provided  PRECAUTIONS: Shoulder  PATIENT GOALS: improved mobility, and use of his right arm  NEXT MD VISIT: January 2024  OBJECTIVE:   PATIENT SURVEYS:  FOTO 53.85  UPPER EXTREMITY ROM:   Active ROM Right eval Left eval  Shoulder flexion 130; slow and required LLE for controlled descent 136  Shoulder extension    Shoulder abduction 140; slow and required LLE for controlled descent 114  Shoulder adduction    Shoulder internal rotation To L4   Shoulder external rotation To T2   Elbow flexion    Elbow extension    Wrist flexion    Wrist extension    Wrist ulnar deviation    Wrist radial deviation    Wrist pronation    Wrist supination    (Blank rows = not tested)  TODAY'S TREATMENT:                                                                                                                                          DATE:                                    1/18 EXERCISE LOG  Exercise Repetitions and Resistance Comments  UBE X10 minutes @ 90 RPM   Resisted shoulder extension Green t-band x 25 reps    Half range shoulder flexion Green thereaband 3x10 reps   Resisted IR  Green t-band x 30 reps    Resisted flexion  Green t-band x 30 reps    Resisted ER  Green t-band x 30 reps    Shoulder flexion with resisted ABD Red t-band x 20 reps    Wall slide 20 reps Min A from LUE but 90 deg is painful  Ball circles  2 minutes @ 90 degrees flexion   RUE D2 Blue XTS x30 rpes   Resisted ADD  Blue XTS x 2 minutes   Resisted pull down  Blue XTS x 20 reps    SL flexion X20 reps   SL abduction X20 reps    Blank cell = exercise not performed today   PATIENT EDUCATION: Education details: Plan of care, prognosis, objective measurements, healing, and goals for therapy Person educated: Patient Education method: Explanation Education comprehension: verbalized understanding  HOME EXERCISE PROGRAM: Wall slides were added to his HEP  ASSESSMENT:  CLINICAL IMPRESSION:  Patient presented in clinic with no complaints of pain. Patient continues to have pain at approximately 90 deg shoulder flexion. Patient could complete shoulder flexion with horizontal abduction with less pain per report. Muscle fatigue also a notable limitation as well.  OBJECTIVE IMPAIRMENTS: decreased ROM, decreased strength, impaired UE functional use, and pain.   ACTIVITY  LIMITATIONS: carrying, lifting, and reach over head  PARTICIPATION LIMITATIONS: meal prep, cleaning, shopping, and occupation  PERSONAL FACTORS: 3+ comorbidities: Brachial plexus injury, chronic low back pain, history of left rotator cuff repair  are also affecting patient's functional outcome.   REHAB POTENTIAL: Good  CLINICAL DECISION MAKING: Evolving/moderate complexity  EVALUATION COMPLEXITY: Moderate  GOALS: Goals reviewed  with patient? Yes  SHORT TERM GOALS: Target date: 03/23/22  Patient will be independent with his initial HEP. Baseline: Goal status: INITIAL  2.  Patient will report being able to lay supine without his familiar right shoulder pain exceeding 4/10. Baseline:  Goal status: INITIAL  3.  Patient will be able to lift at least 1 pound overhead with his right upper extremity for improved function with household activities. Baseline:  Goal status: INITIAL  LONG TERM GOALS: Target date: 04/13/22  Patient will be independent with his advanced HEP. Baseline:  Goal status: INITIAL  2.  Patient will be able to lift at least 5 pounds with his right upper extremity for improved function with household activities. Baseline:  Goal status: INITIAL  3.  Patient will be able to single-leg supine without his familiar right shoulder pain exceeding 2/10 for improved function with sleep. Baseline:  Goal status: INITIAL  PLAN:  PT FREQUENCY: 1-2x/week  PT DURATION: 6 weeks  PLANNED INTERVENTIONS: Therapeutic exercises, Therapeutic activity, Neuromuscular re-education, Patient/Family education, Self Care, Joint mobilization, Electrical stimulation, Cryotherapy, Moist heat, Vasopneumatic device, Manual therapy, and Re-evaluation  PLAN FOR NEXT SESSION: Wall slides, resisted rows, pull downs, and right upper extremity strengthening with modalities as needed  Standley Brooking, PTA 04/02/2022, 12:16 PM

## 2022-04-06 ENCOUNTER — Ambulatory Visit: Payer: 59

## 2022-04-06 DIAGNOSIS — M25511 Pain in right shoulder: Secondary | ICD-10-CM | POA: Diagnosis not present

## 2022-04-06 DIAGNOSIS — M6281 Muscle weakness (generalized): Secondary | ICD-10-CM

## 2022-04-06 NOTE — Therapy (Signed)
OUTPATIENT PHYSICAL THERAPY SHOULDER TREATMENT   Patient Name: Ryan Rojas MRN: [id number] DOB:1965/12/20, 57 y.o., male Today's Date: 04/06/2022  END OF SESSION:  PT End of Session - 04/06/22 0817     Visit Number 8    Number of Visits 12    Date for PT Re-Evaluation 05/08/22    PT Start Time 0815    PT Stop Time 0855    PT Time Calculation (min) 40 min    Activity Tolerance Patient limited by fatigue;Patient limited by pain    Behavior During Therapy [facility] for tasks assessed/performed            Past Medical History:  Diagnosis Date   Back pain    Hyperlipidemia    Past Surgical History:  Procedure Laterality Date   COLONOSCOPY WITH PROPOFOL N/A 02/24/2022   Procedure: COLONOSCOPY WITH PROPOFOL;  Surgeon: Lanelle Bal, DO;  [facility];  Service: Endoscopy;  Laterality: N/A;  10:30 AM, moved to 12/12 at 12:30   ROTATOR CUFF REPAIR Left 07/10/2021   Patient Active Problem List   Diagnosis Date Noted   Low back pain without sciatica 12/31/2021   Tendinopathy of right biceps tendon 12/23/2021   Carpal tunnel syndrome of right wrist 12/09/2021   Chronic midline low back pain with right-sided sciatica 12/09/2021   Paresthesia 12/09/2021   S/P left rotator cuff repair 12/09/2021   Nontraumatic incomplete tear of right rotator cuff 12/09/2021   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   Opiate analgesic use agreement exists 11/05/2021   Gasping for breath 11/05/2021   At risk for central sleep apnea 11/05/2021   Risk factors for obstructive sleep apnea 11/05/2021   Sleep related headaches 11/05/2021   Right hand paresthesia 09/11/2021   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   Obstructive sleep apnea 09/11/2021   Nontraumatic complete tear of left rotator cuff 05/28/2021   Impingement syndrome of left shoulder 05/28/2021   Tendinopathy of left biceps tendon 05/28/2021   Arm paresthesia, left 01/02/2021   Chronic right shoulder  pain 01/02/2021   Other fatigue 01/02/2021   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   Neuropathic pain 09/30/2020   Left arm weakness 09/17/2020   Left arm pain 09/17/2020   Neck pain on left side 09/17/2020   Cubital tunnel syndrome on left 08/16/2020   Intractable neuropathic pain of lumbosacral origin 05/20/2020   Combined arterial insufficiency and corporo-venous occlusive erectile dysfunction 09/27/2019   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   Impotence 01/18/2018   Hyperlipidemia 07/16/2017   PCP: Mechele Claude, MD  REFERRING PROVIDER: Cristie Hem, PA-C  REFERRING DIAG: S/P arthroscopy of right shoulder   THERAPY DIAG:  Acute pain of right shoulder  Muscle weakness (generalized)  Rationale for Evaluation and Treatment: Rehabilitation  ONSET DATE: November 2023  SUBJECTIVE:  SUBJECTIVE STATEMENT: Patient reports that he has not had any problems since his last appointment. He notes that his shoulder is hurting more today than normal when he tries to move his shoulder.   PERTINENT HISTORY: Brachial plexus injury, chronic low back pain, history of left rotator cuff repair  PAIN: No rating provided  PRECAUTIONS: Shoulder  PATIENT GOALS: improved mobility, and use of his right arm  NEXT MD VISIT: 04/07/22  OBJECTIVE:   PATIENT SURVEYS:  FOTO 56.48 on 04/06/22  UPPER EXTREMITY ROM:   Active ROM Right eval Right 04/06/22 Left eval  Shoulder flexion 130; slow and required LLE for controlled descent 145; required LUE assistance and closed chain  136  Shoulder extension     Shoulder abduction 140; slow and required LLE for controlled descent 132; demonstrated shoulder flexion prior to transitioning to ABD  114  Shoulder adduction     Shoulder internal rotation To L4    Shoulder external rotation To  T2    Elbow flexion     Elbow extension     Wrist flexion     Wrist extension     Wrist ulnar deviation     Wrist radial deviation     Wrist pronation     Wrist supination     (Blank rows = not tested)  TODAY'S TREATMENT:                                                                                                                                         DATE:                                    1/22 EXERCISE LOG  Exercise Repetitions and Resistance Comments  UBE  X10 minutes @ 90 RPM   Resisted pull down  Blue XTS x 2 minutes   Resisted ADD  Blue XTS x 2 minutes    Resisted row  Blue XTS x 30 minutes   Resisted ER  Green t-band x 20 reps    Resisted IR  Green t-band x 20 reps    Wall slides (flexion)  30 reps    Ball roll out (flexion and ABD) 1.5 minutes each    Resisted shoulder flexion (partial range)  Green t-band x 2 minutes   Supine shoulder flexion (AROM) 30 reps    Blank cell = exercise not performed today                                    1/18 EXERCISE LOG  Exercise Repetitions and Resistance Comments  UBE X10 minutes @ 90 RPM   Resisted shoulder extension Green t-band x 25 reps    Half range shoulder flexion Green thereaband 3x10 reps   Resisted IR  Green t-band x 30 reps    Resisted flexion  Green t-band x 30 reps    Resisted ER  Green t-band x 30 reps    Shoulder flexion with resisted ABD Red t-band x 20 reps    Wall slide 20 reps Min A from LUE but 90 deg is painful  Ball circles  2 minutes @ 90 degrees flexion   RUE D2 Blue XTS x30 rpes   Resisted ADD  Blue XTS x 2 minutes   Resisted pull down  Blue XTS x 20 reps    SL flexion X20 reps   SL abduction X20 reps    Blank cell = exercise not performed today   PATIENT EDUCATION: Education details: MD follow up, POC following upcoming surgery, and goals for therapy Person educated: Patient Education method: Explanation Education comprehension: verbalized understanding  HOME EXERCISE PROGRAM: Wall  slides were added to his HEP  ASSESSMENT:  CLINICAL IMPRESSION:  Patient is making minimal progress with skilled physical therapy as evidenced by his subjective reports, objective measures, functional mobility, and progress toward his goals. He was able to demonstrate improved shoulder range of motion since beginning therapy. However, he continues to require external support to achieve his full arc of motion. He also continues to report increased shoulder pain at night when trying to sleep. Treatment focused on familiar interventions due to increased shoulder pain and fatigue with today's interventions. He required minimal cueing with today's interventions for proper biomechanics. He reported feeling about the same upon the conclusion of treatment. He continues to require skilled physical therapy to address his remaining impairments to maximize his functional mobility.   OBJECTIVE IMPAIRMENTS: decreased ROM, decreased strength, impaired UE functional use, and pain.   ACTIVITY LIMITATIONS: carrying, lifting, and reach over head  PARTICIPATION LIMITATIONS: meal prep, cleaning, shopping, and occupation  PERSONAL FACTORS: 3+ comorbidities: Brachial plexus injury, chronic low back pain, history of left rotator cuff repair  are also affecting patient's functional outcome.   REHAB POTENTIAL: Good  CLINICAL DECISION MAKING: Evolving/moderate complexity  EVALUATION COMPLEXITY: Moderate  GOALS: Goals reviewed with patient? Yes  SHORT TERM GOALS: Target date: 03/23/22  Patient will be independent with his initial HEP. Baseline: Goal status: MET  2.  Patient will report being able to lay supine without his familiar right shoulder pain exceeding 4/10. Baseline:  Goal status: IN PROGRESS  3.  Patient will be able to lift at least 1 pound overhead with his right upper extremity for improved function with household activities. Baseline:  Goal status: IN PROGRESS  LONG TERM GOALS: Target date:  04/13/22  Patient will be independent with his advanced HEP. Baseline:  Goal status: IN PROGRESS  2.  Patient will be able to lift at least 5 pounds with his right upper extremity for improved function with household activities. Baseline:  Goal status: IN PROGRESS  3.  Patient will be able to single-leg supine without his familiar right shoulder pain exceeding 2/10 for improved function with sleep. Baseline:  Goal status: IN PROGRESS  PLAN:  PT FREQUENCY: 1-2x/week  PT DURATION: 6 weeks  PLANNED INTERVENTIONS: Therapeutic exercises, Therapeutic activity, Neuromuscular re-education, Patient/Family education, Self Care, Joint mobilization, Electrical stimulation, Cryotherapy, Moist heat, Vasopneumatic device, Manual therapy, and Re-evaluation  PLAN FOR NEXT SESSION: Wall slides, resisted rows, pull downs, and right upper extremity strengthening with modalities as needed  Darlin Coco, PT 04/06/2022, 9:06 AM

## 2022-04-07 ENCOUNTER — Ambulatory Visit (INDEPENDENT_AMBULATORY_CARE_PROVIDER_SITE_OTHER): Payer: 59 | Admitting: Orthopaedic Surgery

## 2022-04-07 ENCOUNTER — Encounter: Payer: Self-pay | Admitting: Orthopaedic Surgery

## 2022-04-07 ENCOUNTER — Ambulatory Visit (INDEPENDENT_AMBULATORY_CARE_PROVIDER_SITE_OTHER): Payer: 59

## 2022-04-07 DIAGNOSIS — G8929 Other chronic pain: Secondary | ICD-10-CM

## 2022-04-07 DIAGNOSIS — M25562 Pain in left knee: Secondary | ICD-10-CM

## 2022-04-07 MED ORDER — DIAZEPAM 2 MG PO TABS: ORAL_TABLET | ORAL | 0 refills | Status: DC

## 2022-04-07 NOTE — Progress Notes (Signed)
Office Visit Note   Patient: Ryan Rojas           Date of Birth: 06-01-1965           MRN: [id number] Visit Date: 04/07/2022              Requested by: Claretta Fraise, MD [place],  [place] [address] PCP: Claretta Fraise, MD   Assessment & Plan: Visit Diagnoses:  1. Chronic pain of left knee     Plan: Impression is 2 months status post right shoulder decompression and left knee osteoarthritis.  In regards to the right shoulder, he really needs to continue working on strengthening exercises.  Continue with PT.  In regards to the left knee, I discussed trying a cortisone injection at this point.  He would first like to get a prescription of Valium prior to undergoing the injection.  He will follow-up with [place] for this.  Follow-Up Instructions: Return if symptoms worsen or fail to improve.   Orders:  Orders Placed This Encounter  Procedures   XR KNEE 3 VIEW LEFT   No orders of the defined types were placed in this encounter.     Procedures: No procedures performed   Clinical Data: No additional findings.   Subjective: Chief Complaint  Patient presents with   Left Knee - Pain   Right Shoulder - Pain   Left Shoulder - Pain    HPI patient is a pleasant 57 year old gentleman who comes in today little over 2 months status post right shoulder arthroscopic debridement decompression 01/29/2022.  He is still in slight pain and has been continuing to work on strengthening exercises and physical therapy.  Other issue he brings up today is left knee pain.  This began about 6 to 8 months ago without any injury.  The pain he has is to the medial aspect.  He denies any mechanical symptoms.  Pain is worse with walking as well as going from a seated to standing position.  He has been wearing a knee sleeve with mild relief.  No previous cortisone injection to the left knee.  Review of Systems as detailed in HPI.  All others reviewed and are negative.   Objective: Vital Signs:  There were no vitals taken for this visit.  Physical Exam well-developed well-nourished gentleman in no acute distress.  Alert and oriented x 3.  Ortho Exam right shoulder exam reveals active forward flexion to approximately 90 degrees.  I can passively get him to do 175 degrees.  Full external and internal rotation.  He is neurovascular intact distally.  Left knee exam shows no effusion.  Range of motion 0 to 110 degrees.  Medial joint line tenderness.  Ligaments are stable.  He is neurovascular intact distally.  Specialty Comments:  No specialty comments available.  Imaging: XR KNEE 3 VIEW LEFT  Result Date: 04/07/2022 Moderate medial and patellofemoral degenerative changes    PMFS History: Patient Active Problem List   Diagnosis Date Noted   Low back pain without sciatica 12/31/2021   Tendinopathy of right biceps tendon 12/23/2021   Carpal tunnel syndrome of right wrist 12/09/2021   Chronic midline low back pain with right-sided sciatica 12/09/2021   Paresthesia 12/09/2021   S/P left rotator cuff repair 12/09/2021   Nontraumatic incomplete tear of right rotator cuff 12/09/2021   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   Opiate analgesic use agreement exists 11/05/2021   Gasping for breath 11/05/2021   At risk for central sleep  apnea 11/05/2021   Risk factors for obstructive sleep apnea 11/05/2021   Sleep related headaches 11/05/2021   Right hand paresthesia 09/11/2021   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   Obstructive sleep apnea 09/11/2021   Nontraumatic complete tear of left rotator cuff 05/28/2021   Impingement syndrome of left shoulder 05/28/2021   Tendinopathy of left biceps tendon 05/28/2021   Arm paresthesia, left 01/02/2021   Chronic right shoulder pain 01/02/2021   Other fatigue 01/02/2021   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   Neuropathic pain 09/30/2020   Left arm weakness 09/17/2020   Left arm pain 09/17/2020   Neck pain on left side  09/17/2020   Cubital tunnel syndrome on left 08/16/2020   Intractable neuropathic pain of lumbosacral origin 05/20/2020   Combined arterial insufficiency and corporo-venous occlusive erectile dysfunction 09/27/2019   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   Impotence 01/18/2018   Hyperlipidemia 07/16/2017   Past Medical History:  Diagnosis Date   Back pain    Hyperlipidemia     Family History  Problem Relation Age of Onset   Asthma Mother    Diabetes Sister    Asthma Sister     Past Surgical History:  Procedure Laterality Date   COLONOSCOPY WITH PROPOFOL N/A 02/24/2022   Procedure: COLONOSCOPY WITH PROPOFOL;  Surgeon: Eloise Harman, DO;  [facility];  Service: Endoscopy;  Laterality: N/A;  10:30 AM, moved to 12/12 at 12:30   ROTATOR CUFF REPAIR Left 07/10/2021   Social History   Occupational History   Not on file  Tobacco Use   Smoking status: Never   Smokeless tobacco: Never  Vaping Use   Vaping Use: Never used  Substance and Sexual Activity   Alcohol use: Yes    Alcohol/week: 1.0 standard drink of alcohol    Types: 1 Cans of beer per week   Drug use: No   Sexual activity: Yes    Birth control/protection: None

## 2022-04-09 ENCOUNTER — Ambulatory Visit: Payer: 59

## 2022-04-09 DIAGNOSIS — M25511 Pain in right shoulder: Secondary | ICD-10-CM

## 2022-04-09 DIAGNOSIS — M6281 Muscle weakness (generalized): Secondary | ICD-10-CM

## 2022-04-09 NOTE — Therapy (Addendum)
OUTPATIENT PHYSICAL THERAPY SHOULDER TREATMENT   Patient Name: Ryan Rojas MRN: [id number] DOB:[birth year], 57 y.o., male Today's Date: 04/09/2022  END OF SESSION:  PT End of Session - 04/09/22 0818     Visit Number 9    Number of Visits 12    Date for PT Re-Evaluation 05/08/22    PT Start Time 0815    PT Stop Time 0858    PT Time Calculation (min) 43 min    Activity Tolerance Patient limited by pain    Behavior During Therapy [facility] for tasks assessed/performed            Past Medical History:  Diagnosis Date   Back pain    Hyperlipidemia    Past Surgical History:  Procedure Laterality Date   COLONOSCOPY WITH PROPOFOL N/A 02/24/2022   Procedure: COLONOSCOPY WITH PROPOFOL;  Surgeon: Lanelle Bal, DO;  [facility];  Service: Endoscopy;  Laterality: N/A;  10:30 AM, moved to 12/12 at 12:30   ROTATOR CUFF REPAIR Left 07/10/2021   Patient Active Problem List   Diagnosis Date Noted   Low back pain without sciatica 12/31/2021   Tendinopathy of right biceps tendon 12/23/2021   Carpal tunnel syndrome of right wrist 12/09/2021   Chronic midline low back pain with right-sided sciatica 12/09/2021   Paresthesia 12/09/2021   S/P left rotator cuff repair 12/09/2021   Nontraumatic incomplete tear of right rotator cuff 12/09/2021   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   Opiate analgesic use agreement exists 11/05/2021   Gasping for breath 11/05/2021   At risk for central sleep apnea 11/05/2021   Risk factors for obstructive sleep apnea 11/05/2021   Sleep related headaches 11/05/2021   Right hand paresthesia 09/11/2021   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   Obstructive sleep apnea 09/11/2021   Nontraumatic complete tear of left rotator cuff 05/28/2021   Impingement syndrome of left shoulder 05/28/2021   Tendinopathy of left biceps tendon 05/28/2021   Arm paresthesia, left 01/02/2021   Chronic right shoulder pain 01/02/2021   Other  fatigue 01/02/2021   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   Neuropathic pain 09/30/2020   Left arm weakness 09/17/2020   Left arm pain 09/17/2020   Neck pain on left side 09/17/2020   Cubital tunnel syndrome on left 08/16/2020   Intractable neuropathic pain of lumbosacral origin 05/20/2020   Combined arterial insufficiency and corporo-venous occlusive erectile dysfunction 09/27/2019   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   Impotence 01/18/2018   Hyperlipidemia 07/16/2017   PCP: Mechele Claude, MD  REFERRING PROVIDER: Cristie Hem, PA-C  REFERRING DIAG: S/P arthroscopy of right shoulder   THERAPY DIAG:  Acute pain of right shoulder  Muscle weakness (generalized)  Rationale for Evaluation and Treatment: Rehabilitation  ONSET DATE: November 2023  SUBJECTIVE:  SUBJECTIVE STATEMENT: Patient reports that his shoulder feels alright today. He is scheduled to get injections in his shoulder on 04/23/22.  PERTINENT HISTORY: Brachial plexus injury, chronic low back pain, history of left rotator cuff repair  PAIN: No rating provided  PRECAUTIONS: Shoulder  PATIENT GOALS: improved mobility, and use of his right arm  NEXT MD VISIT: 04/07/22  OBJECTIVE:   PATIENT SURVEYS:  FOTO 56.48 on 04/06/22  UPPER EXTREMITY ROM:   Active ROM Right eval Right 04/06/22 Left eval  Shoulder flexion 130; slow and required LLE for controlled descent 145; required LUE assistance and closed chain  136  Shoulder extension     Shoulder abduction 140; slow and required LLE for controlled descent 132; demonstrated shoulder flexion prior to transitioning to ABD  114  Shoulder adduction     Shoulder internal rotation To L4    Shoulder external rotation To T2    Elbow flexion     Elbow extension     Wrist flexion     Wrist extension      Wrist ulnar deviation     Wrist radial deviation     Wrist pronation     Wrist supination     (Blank rows = not tested)  TODAY'S TREATMENT:                                                                                                                                         DATE:                                    1/25 EXERCISE LOG  Exercise Repetitions and Resistance Comments  UBE X10 minutes @ 90 RPM    Resisted IR  Green t-band x 25 reps    Resisted ER  Green t-band x 25 reps    Supine flexion AROM  Limited by pain    Supine flexion with horizontal resistance  Red t-band x 20 reps    Horizontal abduction  Limited by pain    Resisted pull down  Blue XTS x 30 reps    Resisted ADD Blue XTS x 30 reps    Wall slides  30 reps    Isometric shoulder flexion  2 minutes w/ 5 second hold    Triceps press out  Blue XTS x 30 reps   Pulleys (abduction) 5 minutes    Blank cell = exercise not performed today                                    1/22 EXERCISE LOG  Exercise Repetitions and Resistance Comments  UBE  X10 minutes @ 90 RPM   Resisted pull down  Blue XTS x 2 minutes   Resisted ADD  Blue XTS  x 2 minutes    Resisted row  Blue XTS x 30 minutes   Resisted ER  Green t-band x 20 reps    Resisted IR  Green t-band x 20 reps    Wall slides (flexion)  30 reps    Ball roll out (flexion and ABD) 1.5 minutes each    Resisted shoulder flexion (partial range)  Green t-band x 2 minutes   Supine shoulder flexion (AROM) 30 reps    Blank cell = exercise not performed today                                    1/18 EXERCISE LOG  Exercise Repetitions and Resistance Comments  UBE X10 minutes @ 90 RPM   Resisted shoulder extension Green t-band x 25 reps    Half range shoulder flexion Green thereaband 3x10 reps   Resisted IR  Green t-band x 30 reps    Resisted flexion  Green t-band x 30 reps    Resisted ER  Green t-band x 30 reps    Shoulder flexion with resisted ABD Red t-band x 20 reps     Wall slide 20 reps Min A from LUE but 90 deg is painful  Ball circles  2 minutes @ 90 degrees flexion   RUE D2 Blue XTS x30 rpes   Resisted ADD  Blue XTS x 2 minutes   Resisted pull down  Blue XTS x 20 reps    SL flexion X20 reps   SL abduction X20 reps    Blank cell = exercise not performed today   PATIENT EDUCATION: Education details: MD follow up, POC following upcoming surgery, and goals for therapy Person educated: Patient Education method: Explanation Education comprehension: verbalized understanding  HOME EXERCISE PROGRAM: Wall slides were added to his HEP  ASSESSMENT:  CLINICAL IMPRESSION:  Patient was introduced to multiple new intervention for improved shoulder mobility. However, he continues to be limited by pain and muscular weakness which limits his functional mobility. He experienced significant difficulty with open chain shoulder flexion, but this was able to be significantly reduced with resistance to facilitate horizontal abduction. He reported feeling about the same upon that conclusion of treatment. He continues to require skilled physical therapy to address his remaining impairments to maximize his functional mobility.   PHYSICAL THERAPY DISCHARGE SUMMARY  Visits from Start of Care: 9  Current functional level related to goals / functional outcomes: Patient was unable to meet his goals for skilled physical therapy.    Remaining deficits: Right shoulder strength and AROM    Education / Equipment: HEP    Patient agrees to discharge. Patient goals were partially met. Patient is being discharged due to not returning since the last visit.  Candi Leash, PT, DPT    OBJECTIVE IMPAIRMENTS: decreased ROM, decreased strength, impaired UE functional use, and pain.   ACTIVITY LIMITATIONS: carrying, lifting, and reach over head  PARTICIPATION LIMITATIONS: meal prep, cleaning, shopping, and occupation  PERSONAL FACTORS: 3+ comorbidities: Brachial plexus injury,  chronic low back pain, history of left rotator cuff repair  are also affecting patient's functional outcome.   REHAB POTENTIAL: Good  CLINICAL DECISION MAKING: Evolving/moderate complexity  EVALUATION COMPLEXITY: Moderate  GOALS: Goals reviewed with patient? Yes  SHORT TERM GOALS: Target date: 03/23/22  Patient will be independent with his initial HEP. Baseline: Goal status: MET  2.  Patient will report being able to lay supine without  his familiar right shoulder pain exceeding 4/10. Baseline:  Goal status: IN PROGRESS  3.  Patient will be able to lift at least 1 pound overhead with his right upper extremity for improved function with household activities. Baseline:  Goal status: IN PROGRESS  LONG TERM GOALS: Target date: 04/13/22  Patient will be independent with his advanced HEP. Baseline:  Goal status: IN PROGRESS  2.  Patient will be able to lift at least 5 pounds with his right upper extremity for improved function with household activities. Baseline:  Goal status: IN PROGRESS  3.  Patient will be able to single-leg supine without his familiar right shoulder pain exceeding 2/10 for improved function with sleep. Baseline:  Goal status: IN PROGRESS  PLAN:  PT FREQUENCY: 1-2x/week  PT DURATION: 6 weeks  PLANNED INTERVENTIONS: Therapeutic exercises, Therapeutic activity, Neuromuscular re-education, Patient/Family education, Self Care, Joint mobilization, Electrical stimulation, Cryotherapy, Moist heat, Vasopneumatic device, Manual therapy, and Re-evaluation  PLAN FOR NEXT SESSION: Wall slides, resisted rows, pull downs, and right upper extremity strengthening with modalities as needed  Granville Lewis, PT 04/09/2022, 9:11 AM

## 2022-04-14 ENCOUNTER — Telehealth: Payer: Self-pay

## 2022-04-14 NOTE — Progress Notes (Unsigned)
No chief complaint on file.  Dr Krista Blue: carpal tunnel, plexopathy  Dr Dohmeier: sleep   HISTORY OF PRESENT ILLNESS:  04/14/22 ALL:  Ryan Rojas is a 57 y.o. male here today for follow up for carpal tunnel/brachial plexopathy  followed by Dr Krista Blue and recently diagnosed OSA started on CPAP with Dr Brett Fairy. PSG 12/2021 showed mild OSA with AI of 8.3/hr. AutoPAP advised.   Imipramine? Morning headaches?   He was last seen by Dr Krista Blue 11/2021 and continued to have significant right arm pain and cramping. Referral to pain management placed and Celebrex continued. Lyrica, gabapentin, oxcarb, duloxetine and meloxicam failed in the past.   History (copied from Dr Dohmeier's previous note)  Ryan Rojas is a 57 y.o.  African American male patient seen here as a referral on 11/05/2021 from Dr. Krista Blue, MD  for a sleep consultation. .  Chief concern according to patient :  . Pt referred for sleep consult due to poor sleep quality, frequent awakening, catching for air, dry mouth, high risk for obstructive sleep apnea, very narrow oropharyngeal space, and obesity. He has trouble sleeping,  wakes up choking or gasping, Works days shift. He has had rotator cuff surgery on the left shoulder and seen Dr Krista Blue for plexopathy.    I have the pleasure of seeing Ryan Rojas today, a left-handed African American male with a possible sleep disorder.  He  has a past medical history of  Rotator-cuff injury left, plexopathy,  and Hyperlipidemia.   Sleep relevant medical history: Nocturia once , allergic sinuitis, seasonal.    Family medical /sleep history: NO other family member known to have OSA.    Social history:  Patient is working as Gaffer, Manufacturing engineer ,  and lives in a household alone.No pets, daytime . Tobacco use; never .   ETOH use : socially , 2 a week ,  Caffeine intake in form of Coffee( 1-2 a day) Soda( /) Tea ( /) no energy drinks. Regular exercise in form of PT, GYM. Marland Kitchen       Sleep habits are as follows: The patient's dinner time is between 6-7.30 PM.  The patient goes to bed at 10.30 PM and having trouble to sleep due to pain, he used to sleep in a recliner- continues to sleep for intervals of 1-2 hours, wakes from gasping.   The preferred sleep position is whatever is comfortable , with the support of 2-3 pillows- not adjustable .  Dreams are reportedly infrequent.  4.45 AM is the usual rise time. The patient wakes up spontaneously at 4 AM .  He reports  often not feeling refreshed or restored in AM, with symptoms such as dry mouth, morning headaches, and shoulder / arm pain with residual fatigue.  Naps are taken now infrequently, over 10 years ago e took power naps at lunch time lasting from 10 to 15 minutes and were refreshing .    HISTORY (copied from Dr Rhea Belton previous note)  He works as Best boy, spent most of the time writing, working on Teaching laboratory technician, he woke up 1 day in the middle of May 2022, noticed numbness tingling painful sensation at the left ulnar forearm, extending to left fourth and the fifth fingers,  His symptoms continue to progress in the following days, later on the neuropathic pain spread to left arm involving left armpit, left anterior chest, he described significant sharp shooting pain, worry about the heart attack, he presented to local emergency room, there was  no cardiac etiology found to explain his left chest inner arm discomfort  About a week later, he began to noticed left hand weakness, difficulty to extend his left lateral to 3 fingers, weak grip,   Over the past couple months, his significant pain has much improved, but he still has intermittent flareup of the pain, especially when he coughs, moves suddenly, he would have such radiating pain at the left armpit to his left anterior chest, left ulnar arm and forearm, he continues to have weakness of left hands, mild improvement with Lyrica 200 mg daily  He denies gait  abnormality, he denies bowel bladder incontinence, he denies right upper extremity involvement   Electrodiagnostic study in August 2022 confirmed diagnosis of left lower brachial plexopathy, mainly involving C8-T1, with minor involvement of C7,   He overall has not improved with, unfortunately he began to experience increased left and right shoulder pain,  MRI of left shoulder showed full-thickness incomplete tearing of the anterior supraspinatus tendon insertion with up to 7 mm of tendon retraction, long head biceps tendon is medially perched at the group entry zone with focal tendinosis,   He was seen by orthopedic surgeon Dr. Erlinda Hong, nontraumatic complete tear of left rotator cuff, underwent laparoscopic surgery in March 2023, followed by physical therapy, with some improvement range of motion of left shoulder, continue to have left hand numbness, weakness  Now he complains of increased right shoulder pain, MRI of right shoulder in November 2022 showed moderate tendinosis of the supraspinatus tendon with a small partial-thickness articular surface tear, mild tendinosis of the infraspinatus tendon, partial-thickness tear of the peripheral fibers of the subscapularis tendon, partial-thickness cartilage loss of the glenohumeral joint with area of high-grade partial-thickness cartilage loss of the inferior glenoid with subchondral reactive marrow change  More right shoulder orthopedic intervention is pending, since April 2023, he noticed intermittent episode of right hand numbness tingling, mainly involving 3rd-5th finger, no persistent sensory loss, no weakness  Also complains of increased left knee pain, no bowel or bladder incontinence  We personally reviewed MRI of cervical spine November 2022, multilevel degenerative changes, prominent disc osteophyte diffuse bulging at C5-6 with moderate by foraminal stenosis, C6-7 also showed moderate left foraminal stenosis, mild canal stenosis no spinal cord  compression  In addition, he complains of poor sleep quality, frequent snoring, wake up in the middle of the night, sometimes to the point of difficulty catching his breath To use inhaler, excessive daytime sleepiness, fatigue   UPDate October 29, 2021: He continues to have significant limited range of motion of left shoulder despite arthroscopic surgery for left rotator cuff in March 2023, right shoulder pain and right hand symptoms has improved with right shoulder injection recently   He return for EMG nerve conduction study today, continue evidence of chronic neuropathic changes involving left C8, T1 more than C7 myotomes, consistent with previous diagnosis of left brachial plexopathy, mainly involving left lower trunk, compared to previous study in August 2022, noticed moderate improvement as evident by much improved left ulnar motor CMAP amplitude   UPDATE Sept 26 2023: He came in earlier than expected frustrating about his frequent right hand muscle cramping, only happening at nighttime, 3 times a week over the past few months, when he has right hand muscle cramping, he also noticed right ankle discomfort, mild gait abnormality due to left knee pain, wear knee brace, long history of chronic low back pain, worsening low back pain over the past couple years, radiating pain to right  lower extremity, denies bowel and bladder incontinence.   He is taking Cymbalta 60 mg Rojas morning, seems to help him some   REVIEW OF SYSTEMS: Out of a complete 14 system review of symptoms, the patient complains only of the following symptoms, and all other reviewed systems are negative.   ALLERGIES: Allergies  Allergen Reactions   Hydrocodone Itching   Oxycodone Hives     HOME MEDICATIONS: Outpatient Medications Prior to Visit  Medication Sig Dispense Refill   albuterol (VENTOLIN HFA) 108 (90 Base) MCG/ACT inhaler TAKE 2 PUFFS BY MOUTH Rojas 6 HOURS AS NEEDED FOR WHEEZE OR SHORTNESS OF BREATH 8.5 each  11   atorvastatin (LIPITOR) 40 MG tablet Take 1 tablet (40 mg total) by mouth daily. 90 tablet 3   bisacodyl (DULCOLAX) 5 MG EC tablet Take 5 mg by mouth daily as needed for moderate constipation.     celecoxib (CELEBREX) 100 MG capsule TAKE 1 CAPSULE BY MOUTH 2 TIMES DAILY AS NEEDED. 60 capsule 3   diazepam (VALIUM) 2 MG tablet Take one pill one hour prior to injection and then repeat just prior to injection if needed 20 tablet 0   imipramine (TOFRANIL) 25 MG tablet TAKE 1 TABLET BY MOUTH EVERYDAY AT BEDTIME 90 tablet 1   ondansetron (ZOFRAN) 4 MG tablet Take 1 tablet (4 mg total) by mouth Rojas 8 (eight) hours as needed for nausea or vomiting. 40 tablet 0   sildenafil (VIAGRA) 100 MG tablet TAKE 0.5-1 TABLETS BY MOUTH DAILY AS NEEDED FOR ERECTILE DYSFUNCTION. 6 tablet 7   No facility-administered medications prior to visit.     PAST MEDICAL HISTORY: Past Medical History:  Diagnosis Date   Back pain    Hyperlipidemia      PAST SURGICAL HISTORY: Past Surgical History:  Procedure Laterality Date   COLONOSCOPY WITH PROPOFOL N/A 02/24/2022   Procedure: COLONOSCOPY WITH PROPOFOL;  Surgeon: Eloise Harman, DO;  [facility];  Service: Endoscopy;  Laterality: N/A;  10:30 AM, moved to 12/12 at 12:30   ROTATOR CUFF REPAIR Left 07/10/2021     FAMILY HISTORY: Family History  Problem Relation Age of Onset   Asthma Mother    Diabetes Sister    Asthma Sister      SOCIAL HISTORY: Social History   Socioeconomic History   Marital status: Single    Spouse name: Not on file   Number of children: 3   Years of education: Not on file   Highest education level: High school graduate  Occupational History   Not on file  Tobacco Use   Smoking status: Never   Smokeless tobacco: Never  Vaping Use   Vaping Use: Never used  Substance and Sexual Activity   Alcohol use: Yes    Alcohol/week: 1.0 standard drink of alcohol    Types: 1 Cans of beer per week   Drug use: No    Sexual activity: Yes    Birth control/protection: None  Other Topics Concern   Not on file  Social History Narrative   Lives alone   L handed   Caffeine: 1 C of coffee Rojas AM   Social Determinants of Health   Financial Resource Strain: Not on file  Food Insecurity: Not on file  Transportation Needs: Not on file  Physical Activity: Not on file  Stress: Not on file  Social Connections: Not on file  Intimate Partner Violence: Not on file     PHYSICAL EXAM  There were no vitals filed for  this visit. There is no height or weight on file to calculate BMI.  Generalized: Well developed, in no acute distress  Cardiology: normal rate and rhythm, no murmur auscultated  Respiratory: clear to auscultation bilaterally    Neurological examination  Mentation: Alert oriented to time, place, history taking. Follows all commands speech and language fluent Cranial nerve II-XII: Pupils were equal round reactive to light. Extraocular movements were full, visual field were full on confrontational test. Facial sensation and strength were normal. Uvula tongue midline. Head turning and shoulder shrug  were normal and symmetric. Motor: The motor testing reveals 5 over 5 strength of all 4 extremities. Good symmetric motor tone is noted throughout.  Sensory: Sensory testing is intact to soft touch on all 4 extremities. No evidence of extinction is noted.  Coordination: Cerebellar testing reveals good finger-nose-finger and heel-to-shin bilaterally.  Gait and station: Gait is normal. Tandem gait is normal. Romberg is negative. No drift is seen.  Reflexes: Deep tendon reflexes are symmetric and normal bilaterally.    DIAGNOSTIC DATA (LABS, IMAGING, TESTING) - I reviewed patient records, labs, notes, testing and imaging myself where available.  Lab Results  Component Value Date   WBC 7.4 03/26/2022   HGB 13.0 03/26/2022   HCT 39.2 03/26/2022   MCV 83 03/26/2022   PLT 237 03/26/2022       Component Value Date/Time   NA 143 03/26/2022 1439   K 4.0 03/26/2022 1439   CL 104 03/26/2022 1439   CO2 26 03/26/2022 1439   GLUCOSE 89 03/26/2022 1439   BUN 10 03/26/2022 1439   CREATININE 1.01 03/26/2022 1439   CALCIUM 8.9 03/26/2022 1439   PROT 6.7 03/26/2022 1439   ALBUMIN 4.2 03/26/2022 1439   AST 18 03/26/2022 1439   ALT 14 03/26/2022 1439   ALKPHOS 93 03/26/2022 1439   BILITOT 0.3 03/26/2022 1439   GFRNONAA 78 09/27/2019 0914   GFRAA 90 09/27/2019 0914   Lab Results  Component Value Date   CHOL 203 (H) 03/26/2022   HDL 56 03/26/2022   LDLCALC 129 (H) 03/26/2022   TRIG 99 03/26/2022   CHOLHDL 3.6 03/26/2022   Lab Results  Component Value Date   HGBA1C 5.9 (H) 03/26/2022   Lab Results  Component Value Date   VITAMINB12 742 09/17/2020   Lab Results  Component Value Date   TSH 1.210 09/17/2020        No data to display               No data to display           ASSESSMENT AND PLAN  57 y.o. year old male  has a past medical history of Back pain and Hyperlipidemia. here with    OSA on CPAP  Paresthesia  Neuralgic amyotrophy of left brachial plexus  Carpal tunnel syndrome of right wrist  Insomnia secondary to chronic pain  Allen Norris ***.  Healthy lifestyle habits encouraged. *** will follow up with PCP as directed. *** will return to see me in ***, sooner if needed. *** verbalizes understanding and agreement with this plan.   No orders of the defined types were placed in this encounter.    No orders of the defined types were placed in this encounter.    Shawnie Dapper, MSN, FNP-C 04/14/2022, 1:25 PM  [facility] [address] [place], [place] [address] [phone]

## 2022-04-14 NOTE — Patient Instructions (Signed)
Below is our plan:  We will continue celebrex PRN for now and imipramine at bedtime.   Please continue using your CPAP regularly. While your insurance requires that you use CPAP at least 4 hours each night on 70% of the nights, I recommend, that you not skip any nights and use it throughout the night if you can. Getting used to CPAP and staying with the treatment long term does take time and patience and discipline. Untreated obstructive sleep apnea when it is moderate to severe can have an adverse impact on cardiovascular health and raise her risk for heart disease, arrhythmias, hypertension, congestive heart failure, stroke and diabetes. Untreated obstructive sleep apnea causes sleep disruption, nonrestorative sleep, and sleep deprivation. This can have an impact on your day to day functioning and cause daytime sleepiness and impairment of cognitive function, memory loss, mood disturbance, and problems focussing. Using CPAP regularly can improve these symptoms.  Please make sure you are staying well hydrated. I recommend 50-60 ounces daily. Well balanced diet and regular exercise encouraged. Consistent sleep schedule with 6-8 hours recommended.   Please continue follow up with care team as directed.   Follow up with me in 6 months   You may receive a survey regarding today's visit. I encourage you to leave honest feed back as I do use this information to improve patient care. Thank you for seeing me today!

## 2022-04-15 ENCOUNTER — Ambulatory Visit (INDEPENDENT_AMBULATORY_CARE_PROVIDER_SITE_OTHER): Payer: 59 | Admitting: Family Medicine

## 2022-04-15 ENCOUNTER — Encounter: Payer: Self-pay | Admitting: Family Medicine

## 2022-04-15 VITALS — BP 150/86 | HR 98 | Ht 72.0 in | Wt 255.5 lb

## 2022-04-15 DIAGNOSIS — G5601 Carpal tunnel syndrome, right upper limb: Secondary | ICD-10-CM

## 2022-04-15 DIAGNOSIS — G8929 Other chronic pain: Secondary | ICD-10-CM

## 2022-04-15 DIAGNOSIS — R202 Paresthesia of skin: Secondary | ICD-10-CM

## 2022-04-15 DIAGNOSIS — G4733 Obstructive sleep apnea (adult) (pediatric): Secondary | ICD-10-CM

## 2022-04-15 DIAGNOSIS — G545 Neuralgic amyotrophy: Secondary | ICD-10-CM

## 2022-04-15 DIAGNOSIS — G4701 Insomnia due to medical condition: Secondary | ICD-10-CM

## 2022-04-16 ENCOUNTER — Other Ambulatory Visit: Payer: Self-pay | Admitting: Neurology

## 2022-04-23 ENCOUNTER — Ambulatory Visit (INDEPENDENT_AMBULATORY_CARE_PROVIDER_SITE_OTHER): Payer: 59 | Admitting: Orthopaedic Surgery

## 2022-04-23 DIAGNOSIS — M1712 Unilateral primary osteoarthritis, left knee: Secondary | ICD-10-CM | POA: Diagnosis not present

## 2022-04-23 MED ORDER — BUPIVACAINE HCL 0.5 % IJ SOLN
2.0000 mL | INTRAMUSCULAR | Status: AC | PRN
  Administered 2022-04-23: 2 mL via INTRA_ARTICULAR

## 2022-04-23 MED ORDER — METHYLPREDNISOLONE ACETATE 40 MG/ML IJ SUSP
40.0000 mg | INTRAMUSCULAR | Status: AC | PRN
  Administered 2022-04-23: 40 mg via INTRA_ARTICULAR

## 2022-04-23 MED ORDER — LIDOCAINE HCL 1 % IJ SOLN
2.0000 mL | INTRAMUSCULAR | Status: AC | PRN
  Administered 2022-04-23: 2 mL

## 2022-04-23 NOTE — Progress Notes (Signed)
Office Visit Note   Patient: Ryan Rojas           Date of Birth: [birth year]           MRN: [id number] Visit Date: 04/23/2022              Requested by: Claretta Fraise, MD [place],  [place] [address] PCP: Claretta Fraise, MD   Assessment & Plan: Visit Diagnoses:  1. Primary osteoarthritis of left knee     Plan: Impression is left knee osteoarthritis.  Cortisone injection performed today.  Questions encouraged and answered.  Follow-up as needed.  Follow-Up Instructions: No follow-ups on file.   Orders:  No orders of the defined types were placed in this encounter.  No orders of the defined types were placed in this encounter.     Procedures: Large Joint Inj: L knee on 04/23/2022 3:30 PM Details: 22 G needle Medications: 2 mL bupivacaine 0.5 %; 2 mL lidocaine 1 %; 40 mg methylPREDNISolone acetate 40 MG/ML Outcome: tolerated well, no immediate complications Patient was prepped and draped in the usual sterile fashion.       Clinical Data: No additional findings.   Subjective: Chief Complaint  Patient presents with   Left Knee - Pain    HPI Ryan Rojas returns today for follow-up of left knee pain and possible cortisone injection.  He is recovering from brachial plexus surgery with Dr. Truman Hayward at [facility]. Review of Systems   Objective: Vital Signs: There were no vitals taken for this visit.  Physical Exam  Ortho Exam Examination left knee is unchanged. Specialty Comments:  No specialty comments available.  Imaging: No results found.   PMFS History: Patient Active Problem List   Diagnosis Date Noted   Low back pain without sciatica 12/31/2021   Tendinopathy of right biceps tendon 12/23/2021   Carpal tunnel syndrome of right wrist 12/09/2021   Chronic midline low back pain with right-sided sciatica 12/09/2021   Paresthesia 12/09/2021   S/P left rotator cuff repair 12/09/2021   Nontraumatic incomplete tear of right rotator cuff 12/09/2021   Insomnia  secondary to chronic pain 11/05/2021   Opiate analgesic use agreement exists 11/05/2021   Gasping for breath 11/05/2021   At risk for central sleep apnea 11/05/2021   Risk factors for obstructive sleep apnea 11/05/2021   Sleep related headaches 11/05/2021   Right hand paresthesia 09/11/2021   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   Obstructive sleep apnea 09/11/2021   Nontraumatic complete tear of left rotator cuff 05/28/2021   Impingement syndrome of left shoulder 05/28/2021   Tendinopathy of left biceps tendon 05/28/2021   Arm paresthesia, left 01/02/2021   Chronic right shoulder pain 01/02/2021   Other fatigue 01/02/2021   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   Neuropathic pain 09/30/2020   Left arm weakness 09/17/2020   Left arm pain 09/17/2020   Neck pain on left side 09/17/2020   Cubital tunnel syndrome on left 08/16/2020   Intractable neuropathic pain of lumbosacral origin 05/20/2020   Combined arterial insufficiency and corporo-venous occlusive erectile dysfunction 09/27/2019   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   Impotence 01/18/2018   Hyperlipidemia 07/16/2017   Past Medical History:  Diagnosis Date   Back pain    Hyperlipidemia     Family History  Problem Relation Age of Onset   Asthma Mother    Diabetes Sister    Asthma Sister     Past Surgical History:  Procedure Laterality Date   COLONOSCOPY WITH PROPOFOL N/A 02/24/2022  Procedure: COLONOSCOPY WITH PROPOFOL;  Surgeon: Ryan Harman, DO;  Location: [facility];  Service: Endoscopy;  Laterality: N/A;  10:30 AM, moved to 12/12 at 12:30   ROTATOR CUFF REPAIR Left 07/10/2021   November he had the Right one done   Social History   Occupational History   Not on file  Tobacco Use   Smoking status: Never   Smokeless tobacco: Never  Vaping Use   Vaping Use: Never used  Substance and Sexual Activity   Alcohol use: Yes    Alcohol/week: 1.0 standard drink of alcohol    Types: 1 Cans of beer per  week   Drug use: No   Sexual activity: Yes    Birth control/protection: None

## 2022-05-04 ENCOUNTER — Encounter: Payer: Self-pay | Admitting: Neurology

## 2022-05-04 ENCOUNTER — Ambulatory Visit: Payer: 59 | Admitting: Neurology

## 2022-05-25 IMAGING — MR MR SHOULDER*L* W/O CM
4 of 5 series · 21 of 40 positions shown · non-contrast
Comparison: None.

CLINICAL DATA: Left shoulder pain and limited range of motion for 1
month

EXAM:
MRI OF THE LEFT SHOULDER WITHOUT CONTRAST
TECHNIQUE: Multiplanar, multisequence MR imaging of the shoulder was performed.
No intravenous contrast was administered.

[Series 5: T2 fat-sat · axial · left · 3.0mm · 0.47mm/px · z∈[-49,+51]mm · 8 of 27 slices shown (1 of 3)]
[im 1/27]
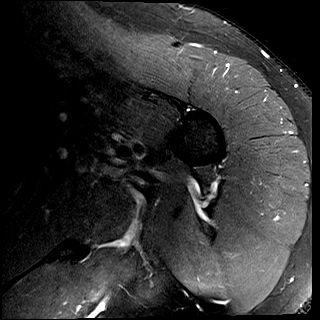
[im 3/27]
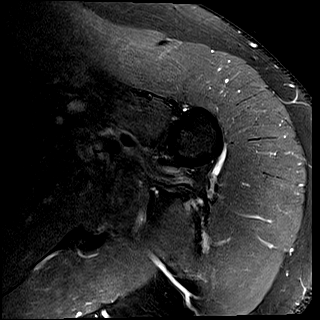
[im 9/27]
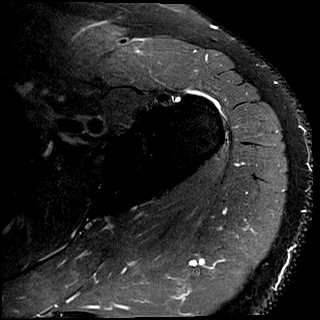
[im 12/27]
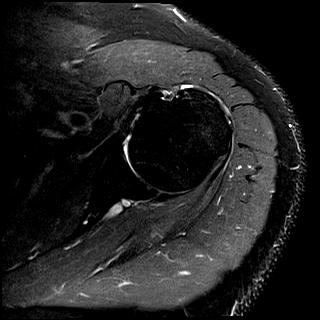
[im 15/27]
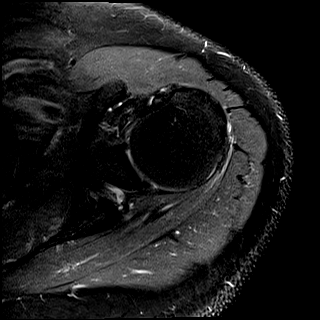
[im 18/27]
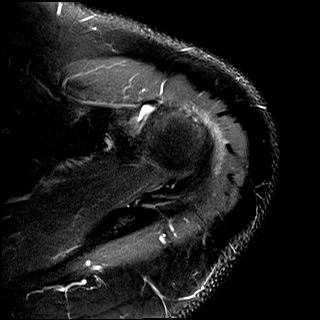
[im 24/27]
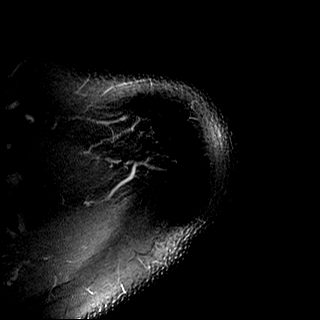
[im 27/27]
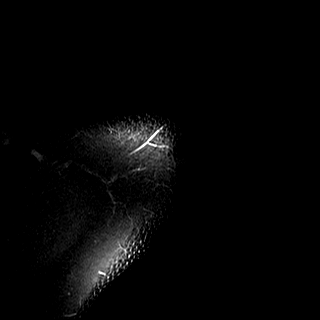

[Series 6: T2 fat-sat · oblique · left · 4.0mm · 0.22mm/px · 3 of 18 slices shown (2 of 3)]
[im 3/18]
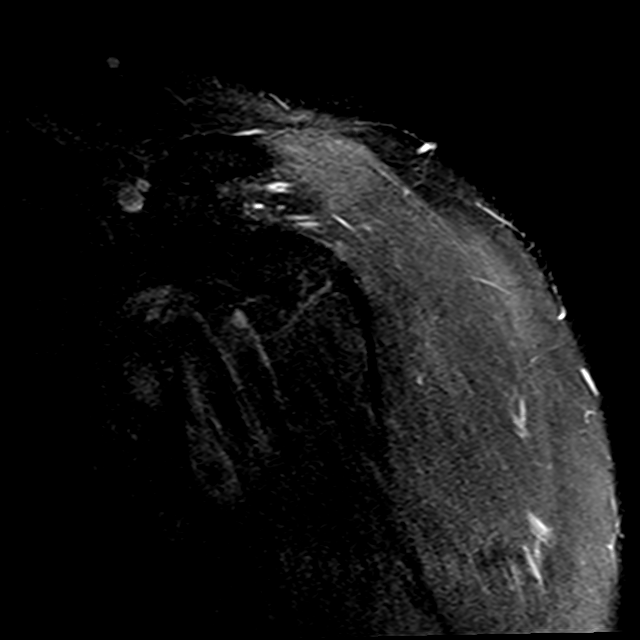
[im 9/18]
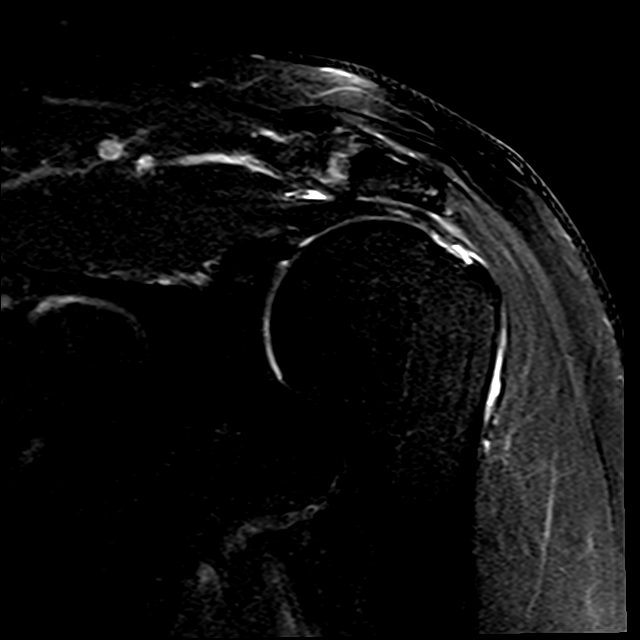
[im 15/18]
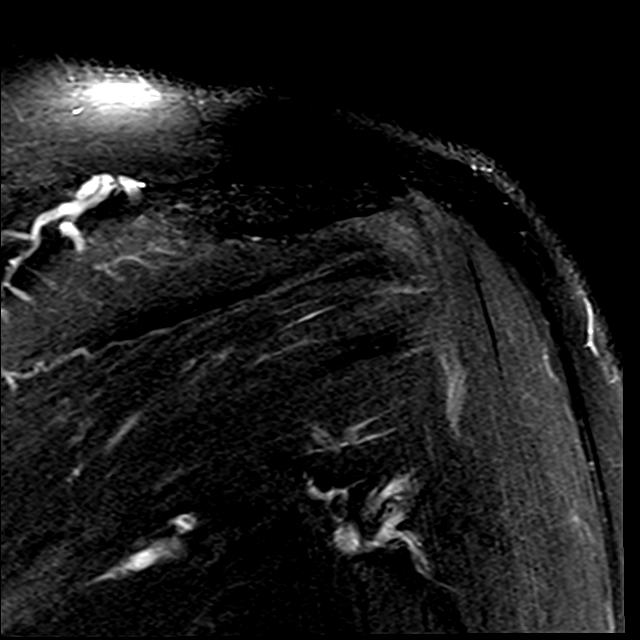

[Series 7: PD · oblique · left · 4.0mm · 0.22mm/px · 7 of 18 slices shown]
[im 1/18]
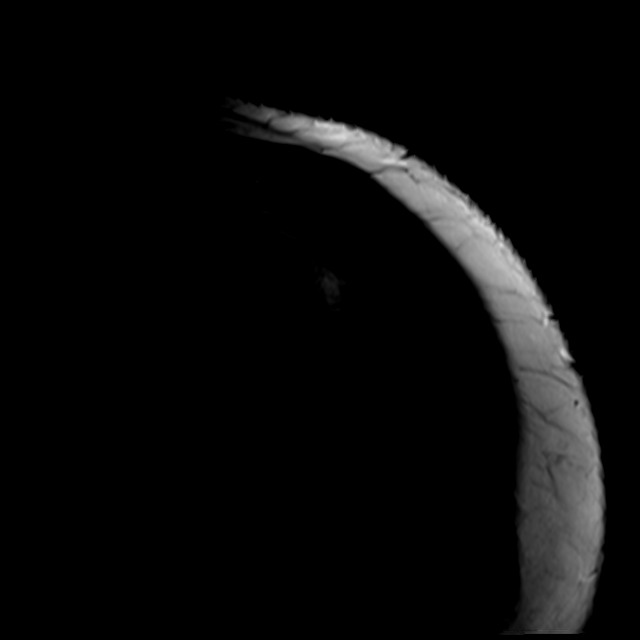
[im 3/18]
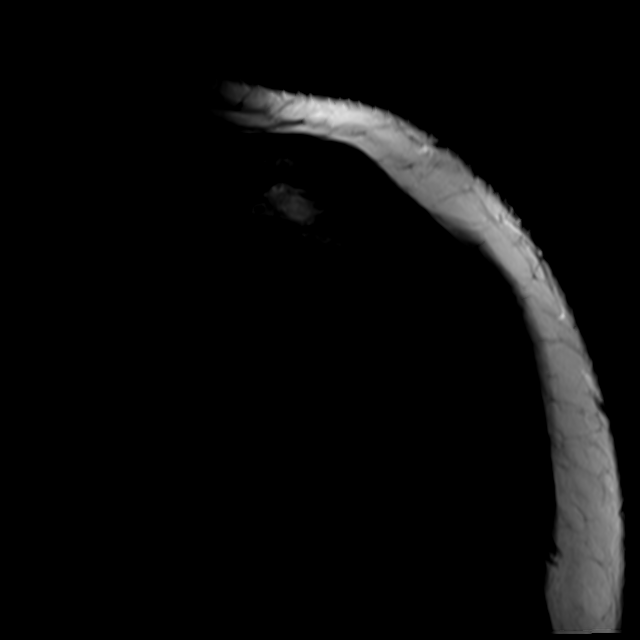
[im 6/18]
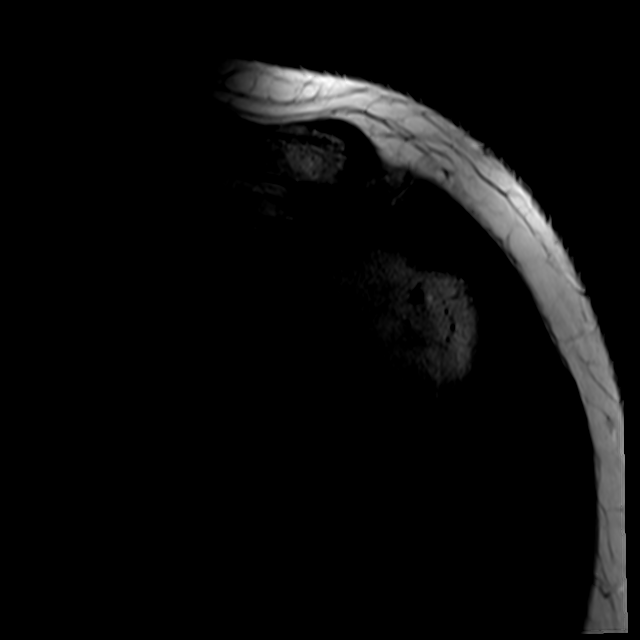
[im 9/18]
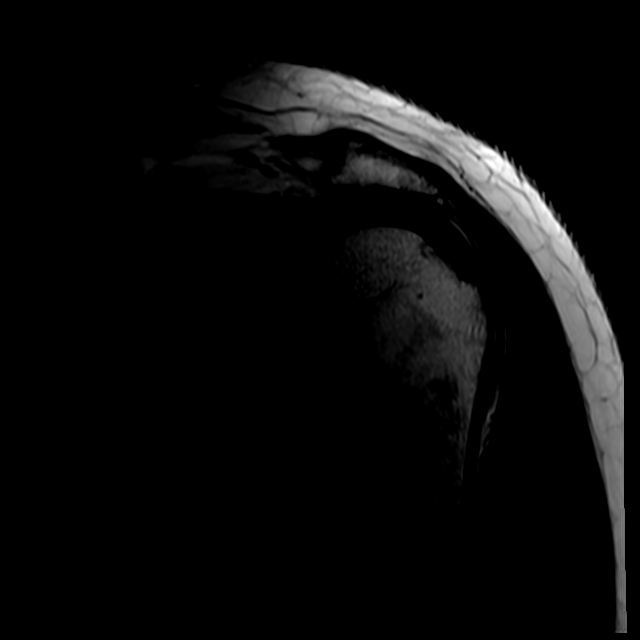
[im 12/18]
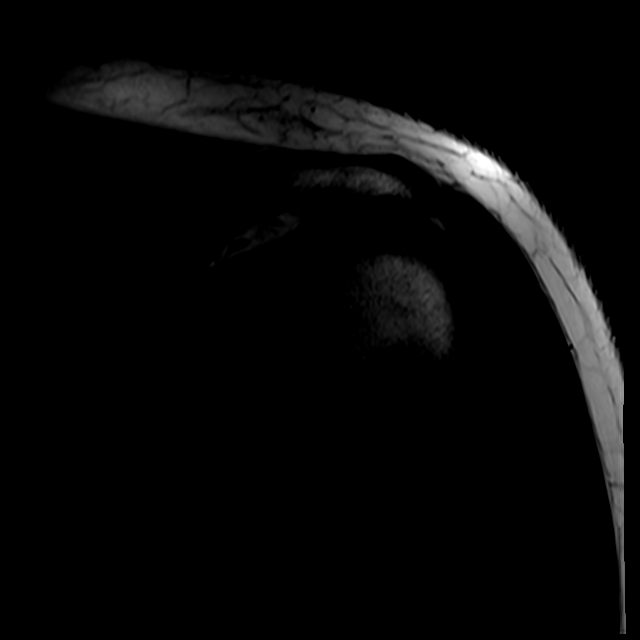
[im 15/18]
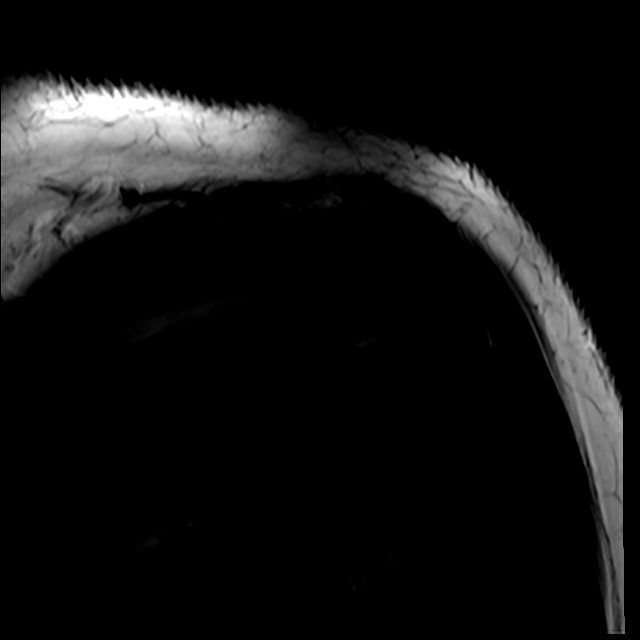
[im 18/18]
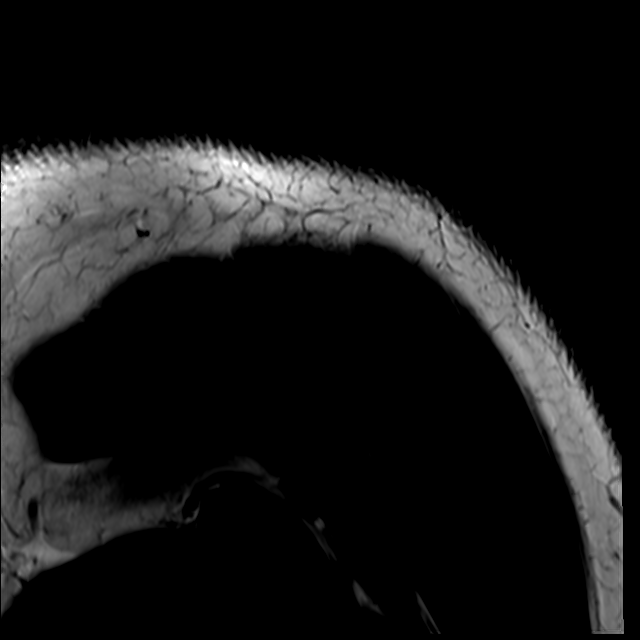

[Series 8: T2 fat-sat · oblique · left · 4.0mm · 0.44mm/px · 3 of 19 slices shown (3 of 3)]
[im 3/19]
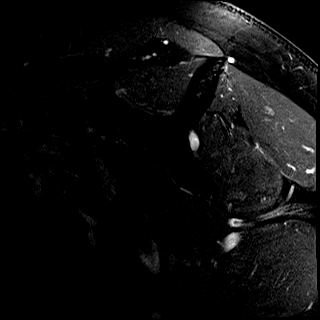
[im 11/19]
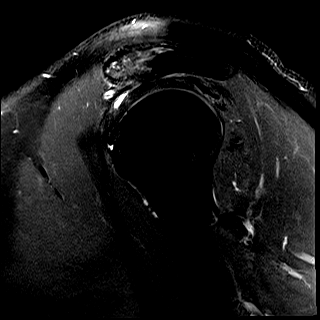
[im 16/19]
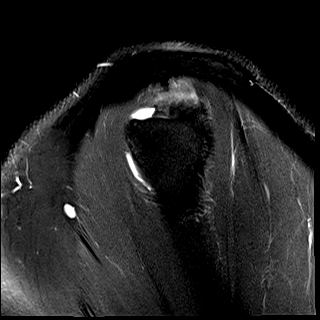

[21 of 40 positions shown; findings below may reference images not displayed]

FINDINGS: Rotator cuff: Full-thickness incomplete tear of the anterior
supraspinatus tendon insertion with up to 7 mm of tendon retraction
(series 6, images 10-12). Tear measures approximately 10 mm in [facility]
dimension. Infraspinatus, subscapularis, and teres minor tendons
intact.

Muscles: Preserved bulk and signal intensity of the rotator cuff
musculature without edema, atrophy, or fatty infiltration.

Biceps long head: Long head biceps tendon is medially perched at the
groove entry zone with focal tendinosis.

Acromioclavicular Joint: Minimal arthropathy of the AC joint. Trace
subacromial-subdeltoid bursal fluid.

Glenohumeral Joint: No joint effusion. No chondral defect.

Labrum: Grossly intact, but evaluation is limited by lack of
intraarticular fluid.

Bones:  No marrow abnormality, fracture or dislocation.

Other: None.
IMPRESSION: 1. Full-thickness incomplete tear of the anterior supraspinatus
tendon insertion with up to 7 mm of tendon retraction.
2. Long head biceps tendon is medially perched at the groove entry
zone with focal tendinosis.

## 2022-05-28 ENCOUNTER — Other Ambulatory Visit: Payer: Self-pay | Admitting: Neurology

## 2022-08-16 ENCOUNTER — Other Ambulatory Visit: Payer: Self-pay | Admitting: Neurology

## 2022-09-07 ENCOUNTER — Ambulatory Visit (INDEPENDENT_AMBULATORY_CARE_PROVIDER_SITE_OTHER): Payer: 59

## 2022-09-07 ENCOUNTER — Ambulatory Visit (INDEPENDENT_AMBULATORY_CARE_PROVIDER_SITE_OTHER): Payer: 59 | Admitting: Nurse Practitioner

## 2022-09-07 ENCOUNTER — Encounter: Payer: Self-pay | Admitting: Nurse Practitioner

## 2022-09-07 VITALS — BP 113/68 | HR 92 | Temp 97.4°F | Ht 72.0 in | Wt 251.0 lb

## 2022-09-07 DIAGNOSIS — R0602 Shortness of breath: Secondary | ICD-10-CM

## 2022-09-07 DIAGNOSIS — J189 Pneumonia, unspecified organism: Secondary | ICD-10-CM | POA: Diagnosis not present

## 2022-09-07 HISTORY — DX: Pneumonia, unspecified organism: J18.9

## 2022-09-07 MED ORDER — METHYLPREDNISOLONE 4 MG PO TBPK: ORAL_TABLET | ORAL | 0 refills | Status: DC

## 2022-09-07 MED ORDER — DOXYCYCLINE MONOHYDRATE 100 MG PO TABS: 100.0000 mg | ORAL_TABLET | Freq: Two times a day (BID) | ORAL | 0 refills | Status: DC

## 2022-09-07 NOTE — Progress Notes (Signed)
Acute Office Visit  Subjective:     Patient ID: Ryan Rojas, male    DOB: [birth year], 57 y.o.   MRN: [id number]  Chief Complaint  Patient presents with   Cough   Shortness of Breath    Started middle of May when he was sick. Hard to sleep because of difficulty breathing. Inhaler helps some but has to use it a lot.    HPI SUBJECTIVE:  Ryan Rojas is a 57 y.o. male who complains of productive cough and hoarseness for 109-months days "May 18th had went out of town cam back with something, my entire family ws sick they got better. My symptoms persist  have been SOB, impossible to lay on my back to sleep, and have been using my inhaler more a lot more". Coughing " yellow" it has to breath when I start cough' worst anytime. He denies a history of fevers, shortness of breath, and sputum production and does not a history of asthma. Patient denies smoke cigarettes. Xray order. Preliminary read yield pneumonia.   ROS Negative unless indicated in HPI    Objective:    BP 113/68   Pulse 92   Temp (!) 97.4 F (36.3 C) (Temporal)   Ht 6' (1.829 m)   Wt 251 lb (113.9 kg)   SpO2 98%   BMI 34.04 kg/m  BP Readings from Last 3 Encounters:  09/07/22 113/68  04/15/22 (!) 150/86  02/26/22 120/65   Wt Readings from Last 3 Encounters:  09/07/22 251 lb (113.9 kg)  04/15/22 255 lb 8 oz (115.9 kg)  03/26/22 250 lb (113.4 kg)      Physical Exam Nursing note reviewed.  Constitutional:      General: He is not in acute distress.    Appearance: He is well-developed and overweight. He is not ill-appearing.  HENT:     Head: Normocephalic and atraumatic.  Eyes:     Extraocular Movements: Extraocular movements intact.     Conjunctiva/sclera: Conjunctivae normal.     Pupils: Pupils are equal, round, and reactive to light.  Cardiovascular:     Rate and Rhythm: Normal rate.     Heart sounds: Normal heart sounds.  Pulmonary:     Effort: Pulmonary effort is normal.     Comments: Fine crackles  lower lobes Skin:    General: Skin is warm and dry.     Findings: No rash.  Neurological:     General: No focal deficit present.     Mental Status: He is alert and oriented to person, place, and time. Mental status is at baseline.     No results found for any visits on 09/07/22.      Assessment & Plan:  Shortness of breath -     DG Chest 2 View -     Doxycycline Monohydrate; Take 1 tablet (100 mg total) by mouth 2 (two) times daily.  Dispense: 20 tablet; Refill: 0 -     methylPREDNISolone; Follow instruction on the pack  Dispense: 21 tablet; Refill: 0  Community acquired pneumonia, unspecified laterality -     Doxycycline Monohydrate; Take 1 tablet (100 mg total) by mouth 2 (two) times daily.  Dispense: 20 tablet; Refill: 0 -     methylPREDNISolone; Follow instruction on the pack  Dispense: 21 tablet; Refill: 0   ASSESSMENT:  pneumonia  PLAN: A 2 views Chest X-Ray was ordered. My reading of this film is preliminary. (No comparison films available: pending review by Radiologist.)  Doxycycline 100 mg 1-tab  for 10 days, Medrol dose pack f#21 dispensed Increase hydration, rest, return office visit prn if symptoms persist or worsen Call or return to clinic prn if these symptoms worsen or fail to improve as anticipated.   Return if symptoms worsen or fail to improve.  [address] Santa Lighter DNP

## 2022-09-14 NOTE — Progress Notes (Signed)
Normal chest x-ray: Normal sized heart. Clear lungs with normal vascularity, No active cardiopulmonary disease.

## 2022-09-29 ENCOUNTER — Other Ambulatory Visit: Payer: Self-pay | Admitting: Neurology

## 2022-10-19 NOTE — Progress Notes (Deleted)
No chief complaint on file.  Dr Terrace Arabia: carpal tunnel, plexopathy  Dr Dohmeier: sleep   HISTORY OF PRESENT ILLNESS:  10/19/22 ALL:  Ryan Rojas returns for follow up for OSA and headaches. He was last seen by me 03/2022 and was doing fairly well on CPAP therapy. He was only taking imipramine consistently. We continued therapy. Since,   Brachial Plexus?   04/15/2022 ALL: Ryan Rojas is a 57 y.o. male here today for follow up for carpal tunnel/brachial plexopathy  followed by Dr Terrace Arabia and recently diagnosed OSA started on CPAP with Dr Vickey Huger. PSG 12/2021 showed mild OSA with AI of 8.3/hr. AutoPAP advised. He reports doing fairly well with therapy. He had some difficulty tolerating his nasal pillow mask and is now doing better with FFM. He admits that he has not been able to sleep much with his device. Most time used is while awake. He returned Airsense 11 provided through insurance and purchased a Airsense 10 online serviced through [employer].   He did start imipramine for morning headaches but admits that he has not used consistently. He reports sleep schedule is disrupted due to shoulder surgery (left shoulder in 06/2021, right shoulder 01/2022) and work schedule. Morning headaches may be a little better.   He was last seen by Dr Terrace Arabia 11/2021 and continued to have significant right arm pain and cramping. Referral to pain management placed and Celebrex continued. He is now seeing [facility]. He has received ESI that may be helping some.He continues PT for shoulder pain. Lyrica, gabapentin, oxcarb, duloxetine and meloxicam failed in the past. He is planning neurolysis brachial plexus with rib resection tomorrow at [facility] with Dr Stephanie Acre.      History (copied from Dr Dohmeier's previous note)  Ryan Rojas is a 57 y.o.  African American male patient seen here as a referral on 11/05/2021 from Dr. Terrace Arabia, MD  for a sleep consultation. .  Chief concern according to patient :  . Pt referred for sleep  consult due to poor sleep quality, frequent awakening, catching for air, dry mouth, high risk for obstructive sleep apnea, very narrow oropharyngeal space, and obesity. He has trouble sleeping,  wakes up choking or gasping, Works days shift. 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    I have the pleasure of seeing Ryan Rojas today, a left-handed African American male with a possible sleep disorder.  He  has a past medical history of  Rotator-cuff injury left, plexopathy,  and Hyperlipidemia.   Sleep relevant medical history: Nocturia once , allergic sinuitis, seasonal.    Family medical /sleep history: NO other family member known to have OSA.    Social history:  Patient is working as Research scientist (medical), Insurance underwriter ,  and lives in a household alone.No pets, daytime . Tobacco use; never .   ETOH use : socially , 2 a week ,  Caffeine intake in form of Coffee( 1-2 a day) Soda( /) Tea ( /) no energy drinks. Regular exercise in form of PT, GYM. Marland Kitchen      Sleep habits are as follows: The patient's dinner time is between 6-7.30 PM.  The patient goes to bed at 10.30 PM and having trouble to sleep due to pain, he used to sleep in a recliner- continues to sleep for intervals of 1-2 hours, wakes from gasping.   The preferred sleep position is whatever is comfortable , with the support of 2-3  pillows- not adjustable .  Dreams are reportedly infrequent.  4.45 AM is the usual rise time. The patient wakes up spontaneously at 4 AM .  He reports  often not feeling refreshed or restored in AM, with symptoms such as dry mouth, morning headaches, and shoulder / arm pain with residual fatigue.  Naps are taken now infrequently, over 10 years ago e took power naps at lunch time lasting from 10 to 15 minutes and were refreshing .    HISTORY (copied from Dr Zannie Cove previous note)  He works as Armed forces technical officer, spent most of the time writing, working on Animator,  he woke up 1 day in the middle of May 2022, noticed numbness tingling painful sensation at the left ulnar forearm, extending to left fourth and the fifth fingers,  His symptoms continue to progress in the following days, later on the neuropathic pain spread to left arm involving left armpit, left anterior chest, he described significant sharp shooting pain, worry about the heart attack, he presented to local emergency room, there was no cardiac etiology found to explain his left chest inner arm discomfort  About a week later, he began to noticed left hand weakness, difficulty to extend his left lateral to 3 fingers, weak grip,   Over the past couple months, his significant pain has much improved, but he still has intermittent flareup of the pain, especially when he coughs, moves suddenly, he would have such radiating pain at the left armpit to his left anterior chest, left ulnar arm and forearm, he continues to have weakness of left hands, mild improvement with Lyrica 200 mg daily  He denies gait abnormality, he denies bowel bladder incontinence, he denies right upper extremity involvement   Electrodiagnostic study in August 2022 confirmed diagnosis of left lower brachial plexopathy, mainly involving C8-T1, with minor involvement of C7,   He overall has not improved with, unfortunately he began to experience increased left and right shoulder pain,  MRI of left shoulder showed full-thickness incomplete tearing of the anterior supraspinatus tendon insertion with up to 7 mm of tendon retraction, long head biceps tendon is medially perched at the group entry zone with focal tendinosis,   He was seen by orthopedic surgeon Dr. Roda Shutters, nontraumatic complete tear of left rotator cuff, underwent laparoscopic surgery in March 2023, followed by physical therapy, with some improvement range of motion of left shoulder, continue to have left hand numbness, weakness  Now he complains of increased right shoulder pain,  MRI of right shoulder in November 2022 showed moderate tendinosis of the supraspinatus tendon with a small partial-thickness articular surface tear, mild tendinosis of the infraspinatus tendon, partial-thickness tear of the peripheral fibers of the subscapularis tendon, partial-thickness cartilage loss of the glenohumeral joint with area of high-grade partial-thickness cartilage loss of the inferior glenoid with subchondral reactive marrow change  More right shoulder orthopedic intervention is pending, since April 2023, he noticed intermittent episode of right hand numbness tingling, mainly involving 3rd-5th finger, no persistent sensory loss, no weakness  Also complains of increased left knee pain, no bowel or bladder incontinence  We personally reviewed MRI of cervical spine November 2022, multilevel degenerative changes, prominent disc osteophyte diffuse bulging at C5-6 with moderate by foraminal stenosis, C6-7 also showed moderate left foraminal stenosis, mild canal stenosis no spinal cord compression  In addition, he complains of poor sleep quality, frequent snoring, wake up in the middle of the night, sometimes to the point of difficulty catching his breath To use inhaler, excessive  daytime sleepiness, fatigue   UPDate October 29, 2021: He continues to have significant limited range of motion of left shoulder despite arthroscopic surgery for left rotator cuff in March 2023, right shoulder pain and right hand symptoms has improved with right shoulder injection recently   He return for EMG nerve conduction study today, continue evidence of chronic neuropathic changes involving left C8, T1 more than C7 myotomes, consistent with previous diagnosis of left brachial plexopathy, mainly involving left lower trunk, compared to previous study in August 2022, noticed moderate improvement as evident by much improved left ulnar motor CMAP amplitude   UPDATE Sept 26 2023: He came in earlier than expected  frustrating about his frequent right hand muscle cramping, only happening at nighttime, 3 times a week over the past few months, when he has right hand muscle cramping, he also noticed right ankle discomfort, mild gait abnormality due to left knee pain, wear knee brace, long history of chronic low back pain, worsening low back pain over the past couple years, radiating pain to right lower extremity, denies bowel and bladder incontinence.   He is taking Cymbalta 60 mg every morning, seems to help him some   REVIEW OF SYSTEMS: Out of a complete 14 system review of symptoms, the patient complains only of the following symptoms, see HPI and all other reviewed systems are negative.  ESS: 6/24   ALLERGIES: Allergies  Allergen Reactions   Hydrocodone Itching   Oxycodone Hives     HOME MEDICATIONS: Outpatient Medications Prior to Visit  Medication Sig Dispense Refill   albuterol (VENTOLIN HFA) 108 (90 Base) MCG/ACT inhaler TAKE 2 PUFFS BY MOUTH EVERY 6 HOURS AS NEEDED FOR WHEEZE OR SHORTNESS OF BREATH 8.5 each 11   atorvastatin (LIPITOR) 40 MG tablet Take 1 tablet (40 mg total) by mouth daily. 90 tablet 3   bisacodyl (DULCOLAX) 5 MG EC tablet Take 5 mg by mouth daily as needed for moderate constipation.     celecoxib (CELEBREX) 100 MG capsule TAKE 1 CAPSULE BY MOUTH 2 TIMES DAILY AS NEEDED. 60 capsule 3   diazepam (VALIUM) 2 MG tablet Take one pill one hour prior to injection and then repeat just prior to injection if needed 20 tablet 0   doxycycline (ADOXA) 100 MG tablet Take 1 tablet (100 mg total) by mouth 2 (two) times daily. 20 tablet 0   imipramine (TOFRANIL) 25 MG tablet TAKE 1 TABLET BY MOUTH EVERYDAY AT BEDTIME 90 tablet 1   methylPREDNISolone (MEDROL DOSEPAK) 4 MG TBPK tablet Follow instruction on the pack 21 tablet 0   ondansetron (ZOFRAN) 4 MG tablet Take 1 tablet (4 mg total) by mouth every 8 (eight) hours as needed for nausea or vomiting. 40 tablet 0   sildenafil (VIAGRA) 100 MG  tablet TAKE 0.5-1 TABLETS BY MOUTH DAILY AS NEEDED FOR ERECTILE DYSFUNCTION. 6 tablet 7   No facility-administered medications prior to visit.     PAST MEDICAL HISTORY: Past Medical History:  Diagnosis Date   Back pain    Hyperlipidemia      PAST SURGICAL HISTORY: Past Surgical History:  Procedure Laterality Date   COLONOSCOPY WITH PROPOFOL N/A 02/24/2022   Procedure: COLONOSCOPY WITH PROPOFOL;  Surgeon: Lanelle Bal, DO;  Location: [facility];  Service: Endoscopy;  Laterality: N/A;  10:30 AM, moved to 12/12 at 12:30   ROTATOR CUFF REPAIR Left 07/10/2021   November he had the Right one done     FAMILY HISTORY: Family History  Problem Relation Age  of Onset   Asthma Mother    Diabetes Sister    Asthma Sister      SOCIAL HISTORY: Social History   Socioeconomic History   Marital status: Single    Spouse name: Not on file   Number of children: 3   Years of education: Not on file   Highest education level: High school graduate  Occupational History   Not on file  Tobacco Use   Smoking status: Never   Smokeless tobacco: Never  Vaping Use   Vaping status: Never Used  Substance and Sexual Activity   Alcohol use: Yes    Alcohol/week: 1.0 standard drink of alcohol    Types: 1 Cans of beer per week   Drug use: No   Sexual activity: Yes    Birth control/protection: None  Other Topics Concern   Not on file  Social History Narrative   Lives alone   L handed   Caffeine: 1 C of coffee every AM   Social Determinants of Health   Financial Resource Strain: Not on file  Food Insecurity: Not on file  Transportation Needs: Not on file  Physical Activity: Not on file  Stress: Not on file  Social Connections: Not on file  Intimate Partner Violence: Not on file     PHYSICAL EXAM  There were no vitals filed for this visit.  There is no height or weight on file to calculate BMI.  Generalized: Well developed, in no acute distress  Cardiology: normal rate  and rhythm, no murmur auscultated  Respiratory: clear to auscultation bilaterally    Neurological examination  Mentation: Alert oriented to time, place, history taking. Follows all commands speech and language fluent Cranial nerve II-XII: Pupils were equal round reactive to light. Extraocular movements were full, visual field were full on confrontational test. Facial sensation and strength were normal. Uvula tongue midline. Head turning and shoulder shrug  were normal and symmetric. Motor: The motor testing reveals 5 over 5 strength of all 4 extremities. Good symmetric motor tone is noted throughout.  Sensory: Sensory testing is intact to soft touch on all 4 extremities. No evidence of extinction is noted.  Coordination: Cerebellar testing reveals good finger-nose-finger and heel-to-shin bilaterally.  Gait and station: Gait is normal. Tandem gait is normal. Romberg is negative. No drift is seen.  Reflexes: Deep tendon reflexes are symmetric and normal bilaterally.    DIAGNOSTIC DATA (LABS, IMAGING, TESTING) - I reviewed patient records, labs, notes, testing and imaging myself where available.  Lab Results  Component Value Date   WBC 7.4 03/26/2022   HGB 13.0 03/26/2022   HCT 39.2 03/26/2022   MCV 83 03/26/2022   PLT 237 03/26/2022      Component Value Date/Time   NA 143 03/26/2022 1439   K 4.0 03/26/2022 1439   CL 104 03/26/2022 1439   CO2 26 03/26/2022 1439   GLUCOSE 89 03/26/2022 1439   BUN 10 03/26/2022 1439   CREATININE 1.01 03/26/2022 1439   CALCIUM 8.9 03/26/2022 1439   PROT 6.7 03/26/2022 1439   ALBUMIN 4.2 03/26/2022 1439   AST 18 03/26/2022 1439   ALT 14 03/26/2022 1439   ALKPHOS 93 03/26/2022 1439   BILITOT 0.3 03/26/2022 1439   GFRNONAA 78 09/27/2019 0914   GFRAA 90 09/27/2019 0914   Lab Results  Component Value Date   CHOL 203 (H) 03/26/2022   HDL 56 03/26/2022   LDLCALC 129 (H) 03/26/2022   TRIG 99 03/26/2022   CHOLHDL 3.6 03/26/2022  Lab Results   Component Value Date   HGBA1C 5.9 (H) 03/26/2022   Lab Results  Component Value Date   VITAMINB12 742 09/17/2020   Lab Results  Component Value Date   TSH 1.210 09/17/2020        No data to display               No data to display           ASSESSMENT AND PLAN  57 y.o. year old male  has a past medical history of Back pain and Hyperlipidemia. here with    No diagnosis found.  Ryan Rojas reports doing fairly well, today. He is adjusting to autoPAP therapy. I have reviewed his compliance report showing sub optimal usage, however, AHI is well managed when used. He reports not using consistently when sleeping. I have educated him on need for therapy at night while asleep. I have advised setting small goals and building time each week. Morning headaches seem better. I have encouraged him to take imipramine daily at bedtime. He may continue Celebrex as needed. He will continue close follow up with care team. Healthy lifestyle habits encouraged.He will return to see me in 6 months, sooner if needed. He verbalizes understanding and agreement with this plan.   No orders of the defined types were placed in this encounter.    No orders of the defined types were placed in this encounter.    Shawnie Dapper, MSN, FNP-C 10/19/2022, 12:28 PM  [facility] [address], [address] [place], [place] [address] [phone]

## 2022-10-21 ENCOUNTER — Ambulatory Visit: Payer: 59 | Admitting: Family Medicine

## 2022-10-21 DIAGNOSIS — G4733 Obstructive sleep apnea (adult) (pediatric): Secondary | ICD-10-CM

## 2022-11-02 ENCOUNTER — Encounter: Payer: Self-pay | Admitting: Nurse Practitioner

## 2022-11-02 ENCOUNTER — Ambulatory Visit (INDEPENDENT_AMBULATORY_CARE_PROVIDER_SITE_OTHER): Payer: 59

## 2022-11-02 ENCOUNTER — Ambulatory Visit (INDEPENDENT_AMBULATORY_CARE_PROVIDER_SITE_OTHER): Payer: 59 | Admitting: Nurse Practitioner

## 2022-11-02 VITALS — BP 116/74 | HR 81 | Temp 97.5°F | Ht 72.0 in | Wt 243.2 lb

## 2022-11-02 DIAGNOSIS — R0602 Shortness of breath: Secondary | ICD-10-CM

## 2022-11-02 DIAGNOSIS — R051 Acute cough: Secondary | ICD-10-CM | POA: Diagnosis not present

## 2022-11-02 MED ORDER — BENZONATATE 100 MG PO CAPS: 100.0000 mg | ORAL_CAPSULE | Freq: Three times a day (TID) | ORAL | 0 refills | Status: DC | PRN

## 2022-11-02 MED ORDER — METHYLPREDNISOLONE 4 MG PO TBPK: ORAL_TABLET | ORAL | 0 refills | Status: DC

## 2022-11-02 MED ORDER — AMOXICILLIN 875 MG PO TABS: 875.0000 mg | ORAL_TABLET | Freq: Two times a day (BID) | ORAL | 0 refills | Status: AC

## 2022-11-02 NOTE — Progress Notes (Signed)
Acute Office Visit  Subjective:     Patient ID: Ryan Rojas, male    DOB: [birth year], 57 y.o.   MRN: [id number]  Chief Complaint  Patient presents with   Shortness of Breath    Was here end of June for shortness of breath, state it got better but about 2 weeks ago started feeling short of breath again.    Cough    Has been going on for about 2 weeks and makes shortness of breath worse. States coughing makes right side of chest and back hurt.    HPI Cough:   [place]ay  presents with a cough and shortness of breath (SOB) that started two weeks ago. The cough is described as dry productive green sputum, and it is constant. There is no presence/presence of blood in the sputum. The SOB is moderate and constant, occurs with exertion, with no significant improvement noted over the past two weeks. The patient was previously treated for community-acquired pneumonia in June, and follow-up care was not completed as per the treatment plan. " I was doing well after taking the medications,not sure what happen"  The patient's overall health since the previous pneumonia episode has been stable. He denies other symptoms such as fever, chills, night sweats, or chest pain. There is no recent weight change, or weight loss and [no known allergies, recently developed allergies. The patient's social history includes non smoking status and non exposure to environmental irritant.Chest x-ray shows No active cardiopulmonary disease.   Review of Systems  Constitutional:  Negative for chills and fever.  HENT:  Negative for congestion and sore throat.   Respiratory:  Positive for cough and shortness of breath.   Cardiovascular:  Negative for chest pain and leg swelling.  Gastrointestinal:  Negative for blood in stool, melena, nausea and vomiting.  Musculoskeletal:  Negative for falls and myalgias.  Neurological:  Negative for dizziness and headaches.  Psychiatric/Behavioral:  Negative for substance abuse and suicidal  ideas. The patient does not have insomnia.    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   Intractable neuropathic pain of lumbosacral origin 05/20/2020   Cubital tunnel syndrome on left 08/16/2020   Left arm weakness 09/17/2020   Left arm pain 09/17/2020   Neck pain on left side 09/17/2020   Neuropathic pain 09/30/2020   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   Arm paresthesia, left 01/02/2021   Chronic right shoulder pain 01/02/2021   Other fatigue 01/02/2021   Nontraumatic complete tear of left rotator cuff 05/28/2021   Impingement syndrome of left shoulder 05/28/2021   Tendinopathy of left biceps tendon 05/28/2021   Right hand paresthesia 09/11/2021   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   Obstructive sleep apnea 09/11/2021   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   Opiate analgesic use agreement exists 11/05/2021   Gasping for breath 11/05/2021   At risk for central sleep apnea 11/05/2021   Risk factors for obstructive sleep apnea 11/05/2021   Sleep related headaches 11/05/2021   Carpal tunnel syndrome of right wrist 12/09/2021   Chronic midline low back pain with right-sided sciatica 12/09/2021   Paresthesia 12/09/2021   S/P left rotator cuff repair 12/09/2021   Nontraumatic incomplete tear of right rotator cuff 12/09/2021   Tendinopathy of right biceps tendon 12/23/2021   Low back pain without sciatica 12/31/2021   Shortness of  breath 09/07/2022   Community acquired pneumonia 09/07/2022   Acute cough 11/02/2022   Resolved Ambulatory Problems    Diagnosis Date Noted   No Resolved Ambulatory Problems   Past Medical History:  Diagnosis Date   Back pain     Negative unless indicated in HPI    Objective:    BP 116/74   Pulse 81   Temp (!) 97.5 F (36.4 C)  (Temporal)   Ht 6' (1.829 m)   Wt 243 lb 3.2 oz (110.3 kg)   SpO2 97%   BMI 32.98 kg/m  BP Readings from Last 3 Encounters:  11/02/22 116/74  09/07/22 113/68  04/15/22 (!) 150/86   Wt Readings from Last 3 Encounters:  11/02/22 243 lb 3.2 oz (110.3 kg)  09/07/22 251 lb (113.9 kg)  04/15/22 255 lb 8 oz (115.9 kg)      Physical Exam Vitals and nursing note reviewed.  Constitutional:      Appearance: He is well-developed. He is obese.  HENT:     Head: Normocephalic and atraumatic.     Nose: No congestion or rhinorrhea.  Eyes:     General: No scleral icterus.    Extraocular Movements: Extraocular movements intact.     Conjunctiva/sclera: Conjunctivae normal.     Pupils: Pupils are equal, round, and reactive to light.  Cardiovascular:     Rate and Rhythm: Normal rate and regular rhythm.  Pulmonary:     Breath sounds: Wheezing present.  Musculoskeletal:        General: Normal range of motion.  Skin:    General: Skin is warm and dry.     Coloration: Skin is not jaundiced.     Findings: No rash.  Neurological:     Mental Status: He is alert and oriented to person, place, and time. Mental status is at baseline.  Psychiatric:        Mood and Affect: Mood normal.        Behavior: Behavior normal.        Thought Content: Thought content normal.        Judgment: Judgment normal.     No results found for any visits on 11/02/22.      Assessment & Plan:  Shortness of breath -     DG Chest 2 View -     methylPREDNISolone; Follow instruction on the box  Dispense: 21 tablet; Refill: 0  Acute cough -     DG Chest 2 View -     methylPREDNISolone; Follow instruction on the box  Dispense: 21 tablet; Refill: 0 -     Benzonatate; Take 1 capsule (100 mg total) by mouth 3 (three) times daily as needed for cough.  Dispense: 20 capsule; Refill: 0 -     Amoxicillin; Take 1 tablet (875 mg total) by mouth 2 (two) times daily for 7 days.  Dispense: 14 tablet; Refill: 0  Thornell was seen  today for recurrent cough and SOB _ Medrol dose pack, Tessalon pearls and Amoxicillin dispensed today. Increase hydration Rest, OTC tylenol/ibuprofen for fever   The above assessment and management plan was discussed with the patient. The patient verbalized understanding of and has agreed to the management plan. Patient is aware to call the clinic if they develop any new symptoms or if symptoms persist or worsen. Patient is aware when to return to the clinic for a follow-up visit. Patient educated on when it is appropriate to go to the emergency department.  Return if symptoms worsen or  fail to improve.  Arrie Aran Santa Lighter, DNP [facility] [address] [place], [place] [address] [phone]

## 2022-11-10 ENCOUNTER — Encounter: Payer: Self-pay | Admitting: Family Medicine

## 2022-11-10 ENCOUNTER — Other Ambulatory Visit: Payer: Self-pay | Admitting: Family Medicine

## 2022-11-10 ENCOUNTER — Ambulatory Visit (INDEPENDENT_AMBULATORY_CARE_PROVIDER_SITE_OTHER): Payer: 59 | Admitting: Family Medicine

## 2022-11-10 VITALS — BP 112/69 | HR 84 | Temp 97.8°F | Ht 72.0 in | Wt 242.4 lb

## 2022-11-10 DIAGNOSIS — R051 Acute cough: Secondary | ICD-10-CM | POA: Diagnosis not present

## 2022-11-10 DIAGNOSIS — E782 Mixed hyperlipidemia: Secondary | ICD-10-CM | POA: Diagnosis not present

## 2022-11-10 DIAGNOSIS — R7303 Prediabetes: Secondary | ICD-10-CM | POA: Diagnosis not present

## 2022-11-10 LAB — CMP14+EGFR
ALT: 20 IU/L (ref 0–44)
AST: 24 IU/L (ref 0–40)
Albumin: 4.4 g/dL (ref 3.8–4.9)
Alkaline Phosphatase: 88 IU/L (ref 44–121)
BUN/Creatinine Ratio: 11 (ref 9–20)
BUN: 12 mg/dL (ref 6–24)
Bilirubin Total: 0.3 mg/dL (ref 0.0–1.2)
CO2: 24 mmol/L (ref 20–29)
Calcium: 9.4 mg/dL (ref 8.7–10.2)
Chloride: 102 mmol/L (ref 96–106)
Creatinine, Ser: 1.12 mg/dL (ref 0.76–1.27)
Globulin, Total: 2.3 g/dL (ref 1.5–4.5)
Glucose: 101 mg/dL — ABNORMAL HIGH (ref 70–99)
Potassium: 4.7 mmol/L (ref 3.5–5.2)
Sodium: 139 mmol/L (ref 134–144)
Total Protein: 6.7 g/dL (ref 6.0–8.5)
eGFR: 78 mL/min/{1.73_m2} (ref >59)

## 2022-11-10 LAB — BAYER DCA HB A1C WAIVED: HB A1C (BAYER DCA - WAIVED): 5.7 % — ABNORMAL HIGH (ref 4.8–5.6)

## 2022-11-10 LAB — LIPID PANEL
Chol/HDL Ratio: 3.3 ratio (ref 0.0–5.0)
Cholesterol, Total: 169 mg/dL (ref 100–199)
HDL: 52 mg/dL (ref >39)
LDL Chol Calc (NIH): 106 mg/dL — ABNORMAL HIGH (ref 0–99)
Triglycerides: 58 mg/dL (ref 0–149)
VLDL Cholesterol Cal: 11 mg/dL (ref 5–40)

## 2022-11-10 LAB — CBC WITH DIFFERENTIAL/PLATELET
Basophils Absolute: 0.1 10*3/uL (ref 0.0–0.2)
Basos: 1 %
EOS (ABSOLUTE): 0.5 10*3/uL — ABNORMAL HIGH (ref 0.0–0.4)
Eos: 6 %
Hematocrit: 42.1 % (ref 37.5–51.0)
Hemoglobin: 13.8 g/dL (ref 13.0–17.7)
Immature Grans (Abs): 0 10*3/uL (ref 0.0–0.1)
Immature Granulocytes: 0 %
Lymphocytes Absolute: 2.1 10*3/uL (ref 0.7–3.1)
Lymphs: 29 %
MCH: 27.5 pg (ref 26.6–33.0)
MCHC: 32.8 g/dL (ref 31.5–35.7)
MCV: 84 fL (ref 79–97)
Monocytes Absolute: 0.4 10*3/uL (ref 0.1–0.9)
Monocytes: 6 %
Neutrophils Absolute: 4.2 10*3/uL (ref 1.4–7.0)
Neutrophils: 58 %
Platelets: 279 10*3/uL (ref 150–450)
RBC: 5.01 x10E6/uL (ref 4.14–5.80)
RDW: 14.6 % (ref 11.6–15.4)
WBC: 7.2 10*3/uL (ref 3.4–10.8)

## 2022-11-10 MED ORDER — LEVALBUTEROL TARTRATE 45 MCG/ACT IN AERO: 2.0000 | INHALATION_SPRAY | RESPIRATORY_TRACT | 12 refills | Status: DC | PRN

## 2022-11-10 MED ORDER — GUAIFENESIN-CODEINE 100-10 MG/5ML PO SYRP: 5.0000 mL | ORAL_SOLUTION | Freq: Four times a day (QID) | ORAL | 0 refills | Status: DC | PRN

## 2022-11-10 MED ORDER — PREDNISONE 10 MG PO TABS: ORAL_TABLET | ORAL | 0 refills | Status: DC

## 2022-11-10 MED ORDER — BUDESONIDE-FORMOTEROL FUMARATE 160-4.5 MCG/ACT IN AERO: 2.0000 | INHALATION_SPRAY | Freq: Two times a day (BID) | RESPIRATORY_TRACT | 5 refills | Status: DC

## 2022-11-10 MED ORDER — MOXIFLOXACIN HCL 400 MG PO TABS: 400.0000 mg | ORAL_TABLET | Freq: Every day | ORAL | 0 refills | Status: DC

## 2022-11-10 NOTE — Progress Notes (Signed)
Subjective:  Patient ID: Ryan Rojas, male    DOB: [birth year]  Age: 57 y.o. MRN: [id number]  CC: Medical Management of Chronic Issues   HPI Qais Wesemann presents for Why do I keep getting pneumonia? Two episodes of two weeks of orthopnea, coughing. Then, recurred 2-3 weeks ago. Treated here using steroids, abx & inhalers.  Getting better. Review of CXRs and notes reveals no dx of pneumonia. Pt. Got covid vax and boosters so he assumed he was safe from infection and has not tested. He continues to have a deep cough through the day and night. He still gets dyspneic at night. He has not had chest pain or tightness.   A1c in the prediabetic range, but improved.      11/10/2022    9:46 AM 11/02/2022    9:15 AM 09/07/2022    9:23 AM  Depression screen PHQ 2/9  Decreased Interest 0 0 0  Down, Depressed, Hopeless 0 0 0  PHQ - 2 Score 0 0 0  Altered sleeping  0 0  Tired, decreased energy  0 0  Change in appetite  0 0  Feeling bad or failure about yourself   0 0  Trouble concentrating  0 0  Moving slowly or fidgety/restless  0 0  Suicidal thoughts  0 0  PHQ-9 Score  0 0  Difficult doing work/chores  Not difficult at all Not difficult at all    History Tayshon has a past medical history of Back pain and Hyperlipidemia.   He has a past surgical history that includes Rotator cuff repair (Left, 07/10/2021) and Colonoscopy with propofol (N/A, 02/24/2022).   His family history includes Asthma in his mother and sister; Diabetes in his sister.He reports that he has never smoked. He has never used smokeless tobacco. He reports current alcohol use of about 1.0 standard drink of alcohol per week. He reports that he does not use drugs.    ROS Review of Systems  Constitutional:  Negative for fever.  Respiratory:  Positive for cough and shortness of breath (with laying down at night).   Cardiovascular:  Negative for chest pain.  Musculoskeletal:  Negative for arthralgias.  Skin:  Negative for  rash.    Objective:  BP 112/69   Pulse 84   Temp 97.8 F (36.6 C)   Ht 6' (1.829 m)   Wt 242 lb 6.4 oz (110 kg)   SpO2 98%   BMI 32.88 kg/m   BP Readings from Last 3 Encounters:  11/10/22 112/69  11/02/22 116/74  09/07/22 113/68    Wt Readings from Last 3 Encounters:  11/10/22 242 lb 6.4 oz (110 kg)  11/02/22 243 lb 3.2 oz (110.3 kg)  09/07/22 251 lb (113.9 kg)     Physical Exam Vitals reviewed.  Constitutional:      Appearance: He is well-developed.  HENT:     Head: Normocephalic and atraumatic.     Right Ear: External ear normal.     Left Ear: External ear normal.     Mouth/Throat:     Pharynx: No oropharyngeal exudate or posterior oropharyngeal erythema.  Eyes:     Pupils: Pupils are equal, round, and reactive to light.  Cardiovascular:     Rate and Rhythm: Normal rate and regular rhythm.     Heart sounds: No murmur heard. Pulmonary:     Effort: No respiratory distress.     Breath sounds: Wheezing present. No rales.  Chest:     Chest wall: No tenderness.  Musculoskeletal:     Cervical back: Normal range of motion and neck supple.  Neurological:     Mental Status: He is alert and oriented to person, place, and time.   Di    Assessment & Plan:   [place]lyn was seen today for medical management of chronic issues.  Diagnoses and all orders for this visit:  Prediabetes -     Bayer DCA Hb A1c Waived -     CBC with Differential/Platelet -     CMP14+EGFR  Mixed hyperlipidemia -     Lipid panel  Acute cough -     Novel Coronavirus, NAA ([facility]); Future -     Novel Coronavirus, NAA ([facility])  Other orders -     guaiFENesin-codeine (ROBITUSSIN AC) 100-10 MG/5ML syrup; Take 5 mLs by mouth 4 (four) times daily as needed for cough. -     moxifloxacin (AVELOX) 400 MG tablet; Take 1 tablet (400 mg total) by mouth daily. -     predniSONE (DELTASONE) 10 MG tablet; Take 5 daily for 3 days followed by 4,3,2 and 1 for 3 days each. -     levalbuterol (XOPENEX  HFA) 45 MCG/ACT inhaler; Inhale 2 puffs into the lungs every 4 (four) hours as needed for wheezing. -     Discontinue: budesonide-formoterol (SYMBICORT) 160-4.5 MCG/ACT inhaler; Inhale 2 puffs into the lungs 2 (two) times daily.   Diet control only for A1c.    I have discontinued Davonne Giovannini's methylPREDNISolone. I am also having him start on guaiFENesin-codeine, moxifloxacin, predniSONE, and levalbuterol. Additionally, I am having him maintain his sildenafil, ondansetron, bisacodyl, atorvastatin, albuterol, diazepam, imipramine, celecoxib, and benzonatate.  Allergies as of 11/10/2022       Reactions   Hydrocodone Itching   Oxycodone Hives        Medication List        Accurate as of November 10, 2022  5:44 PM. If you have any questions, ask your nurse or doctor.          STOP taking these medications    methylPREDNISolone 4 MG Tbpk tablet Commonly known as: MEDROL DOSEPAK Stopped by: Mirriam Vadala       TAKE these medications    albuterol 108 (90 Base) MCG/ACT inhaler Commonly known as: VENTOLIN HFA TAKE 2 PUFFS BY MOUTH EVERY 6 HOURS AS NEEDED FOR WHEEZE OR SHORTNESS OF BREATH   atorvastatin 40 MG tablet Commonly known as: LIPITOR Take 1 tablet (40 mg total) by mouth daily.   benzonatate 100 MG capsule Commonly known as: Tessalon Perles Take 1 capsule (100 mg total) by mouth 3 (three) times daily as needed for cough.   bisacodyl 5 MG EC tablet Commonly known as: DULCOLAX Take 5 mg by mouth daily as needed for moderate constipation.   celecoxib 100 MG capsule Commonly known as: CELEBREX TAKE 1 CAPSULE BY MOUTH 2 TIMES DAILY AS NEEDED.   diazepam 2 MG tablet Commonly known as: Valium Take one pill one hour prior to injection and then repeat just prior to injection if needed   fluticasone-salmeterol 250-50 MCG/ACT Aepb Commonly known as: Wixela Inhub Inhale 1 puff into the lungs in the morning and at bedtime. Started by: Naven Giambalvo    guaiFENesin-codeine 100-10 MG/5ML syrup Commonly known as: ROBITUSSIN AC Take 5 mLs by mouth 4 (four) times daily as needed for cough. Started by: Damare Serano   imipramine 25 MG tablet Commonly known as: TOFRANIL TAKE 1 TABLET BY MOUTH EVERYDAY AT BEDTIME   levalbuterol 45 MCG/ACT  inhaler Commonly known as: XOPENEX HFA Inhale 2 puffs into the lungs every 4 (four) hours as needed for wheezing. Started by: Ephram Kornegay   moxifloxacin 400 MG tablet Commonly known as: AVELOX Take 1 tablet (400 mg total) by mouth daily. Started by: Ananda Sitzer   ondansetron 4 MG tablet Commonly known as: Zofran Take 1 tablet (4 mg total) by mouth every 8 (eight) hours as needed for nausea or vomiting.   predniSONE 10 MG tablet Commonly known as: DELTASONE Take 5 daily for 3 days followed by 4,3,2 and 1 for 3 days each. Started by: Christ Fullenwider   sildenafil 100 MG tablet Commonly known as: VIAGRA TAKE 0.5-1 TABLETS BY MOUTH DAILY AS NEEDED FOR ERECTILE DYSFUNCTION.         Follow-up: Return in about 2 weeks (around 11/24/2022).  Mechele Claude, M.D.

## 2022-11-10 NOTE — Telephone Encounter (Signed)
WIXELA INHUB 250-50 MCG/ACT AEPB        Changed from: budesonide-formoterol (SYMBICORT) 160-4.5 MCG/ACT inhaler   Pharmacy comment: Alternative Requested:NEEDS ALTERNATE.

## 2022-11-11 ENCOUNTER — Other Ambulatory Visit: Payer: Self-pay | Admitting: Family Medicine

## 2022-11-11 ENCOUNTER — Telehealth: Payer: Self-pay | Admitting: Family Medicine

## 2022-11-11 LAB — NOVEL CORONAVIRUS, NAA: SARS-CoV-2, NAA: NOT DETECTED

## 2022-11-11 MED ORDER — PROMETHAZINE-DM 6.25-15 MG/5ML PO SYRP: 5.0000 mL | ORAL_SOLUTION | Freq: Four times a day (QID) | ORAL | 0 refills | Status: DC | PRN

## 2022-11-11 NOTE — Telephone Encounter (Signed)
Pt was prescribed a medicine with codeine yesterday. Wanted to let Dr Darlyn Read know that he is allergic to it because he has been itching since taking it. Needs something else prescribed for him.

## 2022-11-11 NOTE — Telephone Encounter (Signed)
Pt aware and verbalized understanding.  

## 2022-11-11 NOTE — Telephone Encounter (Signed)
Please let the patient know that I sent their prescription to their pharmacy. Thanks, WS 

## 2022-11-11 NOTE — Progress Notes (Signed)
Hello Brack,  Your lab result is normal and/or stable.Some minor variations that are not significant are commonly marked abnormal, but do not represent any medical problem for you.  Best regards, Warren Stacks, M.D.

## 2022-11-25 ENCOUNTER — Ambulatory Visit (INDEPENDENT_AMBULATORY_CARE_PROVIDER_SITE_OTHER): Payer: 59 | Admitting: Family Medicine

## 2022-11-25 ENCOUNTER — Encounter: Payer: Self-pay | Admitting: Family Medicine

## 2022-11-25 VITALS — BP 130/74 | HR 85 | Temp 97.9°F | Ht 72.0 in | Wt 238.8 lb

## 2022-11-25 DIAGNOSIS — J4520 Mild intermittent asthma, uncomplicated: Secondary | ICD-10-CM

## 2022-11-25 DIAGNOSIS — E782 Mixed hyperlipidemia: Secondary | ICD-10-CM

## 2022-11-25 DIAGNOSIS — E669 Obesity, unspecified: Secondary | ICD-10-CM

## 2022-11-25 MED ORDER — PHENTERMINE HCL 37.5 MG PO CAPS: 37.5000 mg | ORAL_CAPSULE | ORAL | 2 refills | Status: DC

## 2022-11-25 NOTE — Progress Notes (Signed)
Subjective:  Patient ID: Ryan Rojas, male    DOB: [birth year]  Age: 57 y.o. MRN: [id number]  CC: Follow-up   HPI Arney Drace presents for recheck of dyspnea. Finished meds a week ago and no cough or dyspnea. Needs help with weight loss. Has a good diet plan and exercise program plan, but needs help with med to implement these. Feeling back to normal.   in for follow-up of elevated cholesterol. Doing well without complaints on current medication. Denies side effects of statin including myalgia and arthralgia and nausea. Currently no chest pain, shortness of breath or other cardiovascular related symptoms noted.  Attempting weight loss. Requesting assistance to get started      11/25/2022    1:14 PM 11/10/2022    9:46 AM 11/02/2022    9:15 AM  Depression screen PHQ 2/9  Decreased Interest 0 0 0  Down, Depressed, Hopeless 0 0 0  PHQ - 2 Score 0 0 0  Altered sleeping   0  Tired, decreased energy   0  Change in appetite   0  Feeling bad or failure about yourself    0  Trouble concentrating   0  Moving slowly or fidgety/restless   0  Suicidal thoughts   0  PHQ-9 Score   0  Difficult doing work/chores   Not difficult at all    History Miccah has a past medical history of Back pain, Community acquired pneumonia (09/07/2022), Hyperlipidemia, Risk factors for obstructive sleep apnea (11/05/2021), and S/P left rotator cuff repair (12/09/2021).   He has a past surgical history that includes Rotator cuff repair (Left, 07/10/2021) and Colonoscopy with propofol (N/A, 02/24/2022).   His family history includes Asthma in his mother and sister; Diabetes in his sister.He reports that he has never smoked. He has never used smokeless tobacco. He reports current alcohol use of about 1.0 standard drink of alcohol per week. He reports that he does not use drugs.    ROS Review of Systems  Constitutional:  Negative for fever.  Respiratory:  Negative for shortness of breath.   Cardiovascular:   Negative for chest pain.  Musculoskeletal:  Negative for arthralgias.  Skin:  Negative for rash.    Objective:  BP 130/74   Pulse 85   Temp 97.9 F (36.6 C)   Ht 6' (1.829 m)   Wt 238 lb 12.8 oz (108.3 kg)   SpO2 97%   BMI 32.39 kg/m   BP Readings from Last 3 Encounters:  11/25/22 130/74  11/10/22 112/69  11/02/22 116/74    Wt Readings from Last 3 Encounters:  11/25/22 238 lb 12.8 oz (108.3 kg)  11/10/22 242 lb 6.4 oz (110 kg)  11/02/22 243 lb 3.2 oz (110.3 kg)     Physical Exam Vitals reviewed.  Constitutional:      Appearance: He is well-developed.  HENT:     Head: Normocephalic and atraumatic.     Right Ear: External ear normal.     Left Ear: External ear normal.     Mouth/Throat:     Pharynx: No oropharyngeal exudate or posterior oropharyngeal erythema.  Eyes:     Pupils: Pupils are equal, round, and reactive to light.  Cardiovascular:     Rate and Rhythm: Normal rate and regular rhythm.     Heart sounds: No murmur heard. Pulmonary:     Effort: No respiratory distress.     Breath sounds: Normal breath sounds.  Musculoskeletal:     Cervical back: Normal range of  motion and neck supple.  Neurological:     Mental Status: He is alert and oriented to person, place, and time.       Assessment & Plan:   Hezekiah was seen today for follow-up.  Diagnoses and all orders for this visit:  Mixed hyperlipidemia -     atorvastatin (LIPITOR) 40 MG tablet; Take 1 tablet (40 mg total) by mouth daily.  Mild intermittent asthma without complication  Obesity (BMI 30.0-34.9) -     phentermine 37.5 MG capsule; Take 1 capsule (37.5 mg total) by mouth every morning.       I have discontinued Willi Himmelberger's albuterol, moxifloxacin, and predniSONE. I am also having him start on phentermine. Additionally, I am having him maintain his sildenafil, ondansetron, bisacodyl, diazepam, imipramine, celecoxib, benzonatate, levalbuterol, fluticasone-salmeterol,  promethazine-dextromethorphan, and atorvastatin.  Allergies as of 11/25/2022       Reactions   Guaifenesin-codeine Itching   Hydrocodone Itching   Oxycodone Hives        Medication List        Accurate as of November 25, 2022 11:59 PM. If you have any questions, ask your nurse or doctor.          STOP taking these medications    albuterol 108 (90 Base) MCG/ACT inhaler Commonly known as: VENTOLIN HFA Stopped by: Riku Buttery   moxifloxacin 400 MG tablet Commonly known as: AVELOX Stopped by: Caster Fayette   predniSONE 10 MG tablet Commonly known as: DELTASONE Stopped by: Trelon Plush       TAKE these medications    atorvastatin 40 MG tablet Commonly known as: LIPITOR Take 1 tablet (40 mg total) by mouth daily.   benzonatate 100 MG capsule Commonly known as: Tessalon Perles Take 1 capsule (100 mg total) by mouth 3 (three) times daily as needed for cough.   bisacodyl 5 MG EC tablet Commonly known as: DULCOLAX Take 5 mg by mouth daily as needed for moderate constipation.   celecoxib 100 MG capsule Commonly known as: CELEBREX TAKE 1 CAPSULE BY MOUTH 2 TIMES DAILY AS NEEDED.   diazepam 2 MG tablet Commonly known as: Valium Take one pill one hour prior to injection and then repeat just prior to injection if needed   fluticasone-salmeterol 250-50 MCG/ACT Aepb Commonly known as: Wixela Inhub Inhale 1 puff into the lungs in the morning and at bedtime.   imipramine 25 MG tablet Commonly known as: TOFRANIL TAKE 1 TABLET BY MOUTH EVERYDAY AT BEDTIME   levalbuterol 45 MCG/ACT inhaler Commonly known as: XOPENEX HFA Inhale 2 puffs into the lungs every 4 (four) hours as needed for wheezing.   ondansetron 4 MG tablet Commonly known as: Zofran Take 1 tablet (4 mg total) by mouth every 8 (eight) hours as needed for nausea or vomiting.   phentermine 37.5 MG capsule Take 1 capsule (37.5 mg total) by mouth every morning. Started by: Broadus John Araf Clugston    promethazine-dextromethorphan 6.25-15 MG/5ML syrup Commonly known as: PROMETHAZINE-DM Take 5 mLs by mouth 4 (four) times daily as needed for cough.   sildenafil 100 MG tablet Commonly known as: VIAGRA TAKE 0.5-1 TABLETS BY MOUTH DAILY AS NEEDED FOR ERECTILE DYSFUNCTION.         Follow-up: Return in about 3 months (around 02/24/2023).  Mechele Claude, M.D.

## 2022-11-27 MED ORDER — ATORVASTATIN CALCIUM 40 MG PO TABS: 40.0000 mg | ORAL_TABLET | Freq: Every day | ORAL | 3 refills | Status: DC

## 2022-12-07 ENCOUNTER — Other Ambulatory Visit: Payer: Self-pay | Admitting: Family Medicine

## 2022-12-07 DIAGNOSIS — N5203 Combined arterial insufficiency and corporo-venous occlusive erectile dysfunction: Secondary | ICD-10-CM

## 2022-12-21 ENCOUNTER — Other Ambulatory Visit: Payer: Self-pay | Admitting: Family Medicine

## 2022-12-30 ENCOUNTER — Other Ambulatory Visit: Payer: Self-pay | Admitting: Family Medicine

## 2022-12-30 ENCOUNTER — Encounter: Payer: Self-pay | Admitting: Family Medicine

## 2022-12-30 ENCOUNTER — Ambulatory Visit (INDEPENDENT_AMBULATORY_CARE_PROVIDER_SITE_OTHER): Payer: 59 | Admitting: Family Medicine

## 2022-12-30 VITALS — BP 117/73 | HR 94 | Temp 97.2°F | Ht 72.0 in | Wt 243.4 lb

## 2022-12-30 DIAGNOSIS — R053 Chronic cough: Secondary | ICD-10-CM | POA: Diagnosis not present

## 2022-12-30 DIAGNOSIS — R0602 Shortness of breath: Secondary | ICD-10-CM | POA: Diagnosis not present

## 2022-12-30 MED ORDER — MOXIFLOXACIN HCL 400 MG PO TABS: 400.0000 mg | ORAL_TABLET | Freq: Every day | ORAL | 0 refills | Status: DC

## 2022-12-30 MED ORDER — BETAMETHASONE SOD PHOS & ACET 6 (3-3) MG/ML IJ SUSP
6.0000 mg | Freq: Once | INTRAMUSCULAR | Status: AC
Start: 2022-12-30 — End: 2022-12-30
  Administered 2022-12-30: 6 mg via INTRAMUSCULAR

## 2022-12-30 MED ORDER — BUDESONIDE-FORMOTEROL FUMARATE 160-4.5 MCG/ACT IN AERO: 2.0000 | INHALATION_SPRAY | Freq: Two times a day (BID) | RESPIRATORY_TRACT | 5 refills | Status: DC

## 2022-12-30 NOTE — Progress Notes (Signed)
Subjective:  Patient ID: Ryan Rojas, male    DOB: May 29, 1965  Age: 57 y.o. MRN: [id number]  CC: Cough and Shortness of Breath   HPI Ryan Rojas presents for Cough returned after 2 weeks. Minimal productivity. Can't lay down on sides due to dyspnea. All movements can lead to dyspnea. CAN WALK 100-200 feet before dyspnea occurs.  Has had multiple episodes of cough continuing.      12/30/2022    3:12 PM 11/25/2022    1:14 PM 11/10/2022    9:46 AM  Depression screen PHQ 2/9  Decreased Interest 0 0 0  Down, Depressed, Hopeless 0 0 0  PHQ - 2 Score 0 0 0    History Ryan Rojas has a past medical history of Back pain, Community acquired pneumonia (09/07/2022), Hyperlipidemia, Risk factors for obstructive sleep apnea (11/05/2021), and S/P left rotator cuff repair (12/09/2021).   He has a past surgical history that includes Rotator cuff repair (Left, 07/10/2021) and Colonoscopy with propofol (N/A, 02/24/2022).   His family history includes Asthma in his mother and sister; Diabetes in his sister.He reports that he has never smoked. He has never used smokeless tobacco. He reports current alcohol use of about 1.0 standard drink of alcohol per week. He reports that he does not use drugs.    ROS Review of Systems  Constitutional:  Negative for activity change, appetite change, chills and fever.  HENT:  Positive for congestion, postnasal drip, rhinorrhea and sinus pressure. Negative for ear discharge, ear pain, hearing loss, nosebleeds, sneezing and trouble swallowing.   Respiratory:  Negative for chest tightness and shortness of breath.   Cardiovascular:  Negative for chest pain and palpitations.  Skin:  Negative for rash.    Objective:  BP 117/73   Pulse 94   Temp (!) 97.2 F (36.2 C)   Ht 6' (1.829 m)   Wt 243 lb 6.4 oz (110.4 kg)   SpO2 97%   BMI 33.01 kg/m   BP Readings from Last 3 Encounters:  12/30/22 117/73  11/25/22 130/74  11/10/22 112/69    Wt Readings from Last 3  Encounters:  12/30/22 243 lb 6.4 oz (110.4 kg)  11/25/22 238 lb 12.8 oz (108.3 kg)  11/10/22 242 lb 6.4 oz (110 kg)     Physical Exam Constitutional:      Appearance: He is well-developed.  HENT:     Head: Normocephalic and atraumatic.     Right Ear: Tympanic membrane and external ear normal. No decreased hearing noted.     Left Ear: Tympanic membrane and external ear normal. No decreased hearing noted.     Nose: Mucosal edema present.     Right Sinus: No frontal sinus tenderness.     Left Sinus: No frontal sinus tenderness.     Mouth/Throat:     Pharynx: No oropharyngeal exudate or posterior oropharyngeal erythema.  Neck:     Meningeal: Brudzinski's sign absent.  Pulmonary:     Effort: No respiratory distress.     Breath sounds: Normal breath sounds.  Lymphadenopathy:     Head:     Right side of head: No preauricular adenopathy.     Left side of head: No preauricular adenopathy.     Cervical:     Right cervical: No superficial cervical adenopathy.    Left cervical: No superficial cervical adenopathy.       Assessment & Plan:   Ryan Rojas was seen today for cough and shortness of breath.  Diagnoses and all orders for this  visit:  Shortness of breath -     D-dimer, quantitative -     Ambulatory referral to Pulmonology -     CBC with Differential/Platelet -     CMP14+EGFR -     betamethasone acetate-betamethasone sodium phosphate (CELESTONE) injection 6 mg  Chronic cough -     CBC with Differential/Platelet -     CMP14+EGFR -     betamethasone acetate-betamethasone sodium phosphate (CELESTONE) injection 6 mg  Other orders -     budesonide-formoterol (SYMBICORT) 160-4.5 MCG/ACT inhaler; Inhale 2 puffs into the lungs 2 (two) times daily. -     moxifloxacin (AVELOX) 400 MG tablet; Take 1 tablet (400 mg total) by mouth daily.       I have discontinued Sun Sliker's fluticasone-salmeterol. I am also having him start on budesonide-formoterol and moxifloxacin.  Additionally, I am having him maintain his ondansetron, bisacodyl, diazepam, imipramine, celecoxib, benzonatate, levalbuterol, promethazine-dextromethorphan, phentermine, atorvastatin, and sildenafil. We administered betamethasone acetate-betamethasone sodium phosphate.  Allergies as of 12/30/2022       Reactions   Guaifenesin-codeine Itching   Hydrocodone Itching   Oxycodone Hives        Medication List        Accurate as of December 30, 2022 11:59 PM. If you have any questions, ask your nurse or doctor.          STOP taking these medications    fluticasone-salmeterol 250-50 MCG/ACT Aepb Commonly known as: Wixela Inhub Stopped by: Nareh Matzke       TAKE these medications    atorvastatin 40 MG tablet Commonly known as: LIPITOR Take 1 tablet (40 mg total) by mouth daily.   benzonatate 100 MG capsule Commonly known as: Tessalon Perles Take 1 capsule (100 mg total) by mouth 3 (three) times daily as needed for cough.   bisacodyl 5 MG EC tablet Commonly known as: DULCOLAX Take 5 mg by mouth daily as needed for moderate constipation.   budesonide-formoterol 160-4.5 MCG/ACT inhaler Commonly known as: SYMBICORT Inhale 2 puffs into the lungs 2 (two) times daily. Started by: Jalise Zawistowski   celecoxib 100 MG capsule Commonly known as: CELEBREX TAKE 1 CAPSULE BY MOUTH 2 TIMES DAILY AS NEEDED.   diazepam 2 MG tablet Commonly known as: Valium Take one pill one hour prior to injection and then repeat just prior to injection if needed   imipramine 25 MG tablet Commonly known as: TOFRANIL TAKE 1 TABLET BY MOUTH EVERYDAY AT BEDTIME   levalbuterol 45 MCG/ACT inhaler Commonly known as: XOPENEX HFA Inhale 2 puffs into the lungs every 4 (four) hours as needed for wheezing.   moxifloxacin 400 MG tablet Commonly known as: AVELOX Take 1 tablet (400 mg total) by mouth daily. Started by: Sonakshi Rolland   ondansetron 4 MG tablet Commonly known as: Zofran Take 1 tablet (4  mg total) by mouth every 8 (eight) hours as needed for nausea or vomiting.   phentermine 37.5 MG capsule Take 1 capsule (37.5 mg total) by mouth every morning.   promethazine-dextromethorphan 6.25-15 MG/5ML syrup Commonly known as: PROMETHAZINE-DM Take 5 mLs by mouth 4 (four) times daily as needed for cough.   sildenafil 100 MG tablet Commonly known as: VIAGRA TAKE 1/2 - 1 TABLET BY MOUTH DAILY AS NEEDED FOR ERECTILE DYSFUNCTION         Follow-up: Return if symptoms worsen or fail to improve.  Mechele Claude, M.D.

## 2022-12-30 NOTE — Telephone Encounter (Signed)
  WIXELA INHUB 250-50 MCG/ACT AEPB    Pharmacy comment: Alternative Requested:NOT COVERED.

## 2022-12-31 ENCOUNTER — Other Ambulatory Visit: Payer: Self-pay | Admitting: Family Medicine

## 2022-12-31 LAB — CBC WITH DIFFERENTIAL/PLATELET
Basophils Absolute: 0.1 10*3/uL (ref 0.0–0.2)
Basos: 1 %
EOS (ABSOLUTE): 0.7 10*3/uL — ABNORMAL HIGH (ref 0.0–0.4)
Eos: 9 %
Hematocrit: 43 % (ref 37.5–51.0)
Hemoglobin: 13.5 g/dL (ref 13.0–17.7)
Immature Grans (Abs): 0 10*3/uL (ref 0.0–0.1)
Immature Granulocytes: 0 %
Lymphocytes Absolute: 2.4 10*3/uL (ref 0.7–3.1)
Lymphs: 29 %
MCH: 27.7 pg (ref 26.6–33.0)
MCHC: 31.4 g/dL — ABNORMAL LOW (ref 31.5–35.7)
MCV: 88 fL (ref 79–97)
Monocytes Absolute: 0.7 10*3/uL (ref 0.1–0.9)
Monocytes: 9 %
Neutrophils Absolute: 4.5 10*3/uL (ref 1.4–7.0)
Neutrophils: 52 %
Platelets: 258 10*3/uL (ref 150–450)
RBC: 4.87 x10E6/uL (ref 4.14–5.80)
RDW: 14.3 % (ref 11.6–15.4)
WBC: 8.4 10*3/uL (ref 3.4–10.8)

## 2022-12-31 LAB — CMP14+EGFR
ALT: 32 [IU]/L (ref 0–44)
AST: 40 [IU]/L (ref 0–40)
Albumin: 4.5 g/dL (ref 3.8–4.9)
Alkaline Phosphatase: 90 [IU]/L (ref 44–121)
BUN/Creatinine Ratio: 14 (ref 9–20)
BUN: 15 mg/dL (ref 6–24)
Bilirubin Total: 0.3 mg/dL (ref 0.0–1.2)
CO2: 24 mmol/L (ref 20–29)
Calcium: 9.1 mg/dL (ref 8.7–10.2)
Chloride: 101 mmol/L (ref 96–106)
Creatinine, Ser: 1.09 mg/dL (ref 0.76–1.27)
Globulin, Total: 2.1 g/dL (ref 1.5–4.5)
Glucose: 72 mg/dL (ref 70–99)
Potassium: 4.3 mmol/L (ref 3.5–5.2)
Sodium: 140 mmol/L (ref 134–144)
Total Protein: 6.6 g/dL (ref 6.0–8.5)
eGFR: 80 mL/min/{1.73_m2} (ref >59)

## 2022-12-31 LAB — D-DIMER, QUANTITATIVE: D-DIMER: <0.2 mg{FEU}/L (ref 0.00–0.49)

## 2022-12-31 NOTE — Telephone Encounter (Signed)
WIXELA INHUB 250-50 MCG/ACT AEPB        Changed from: budesonide-formoterol (SYMBICORT) 160-4.5 MCG/ACT inhaler   Pharmacy comment: Alternative Requested:NEED ALT BREO,WIXELA.Marland Kitchen

## 2023-01-02 ENCOUNTER — Other Ambulatory Visit: Payer: Self-pay | Admitting: Neurology

## 2023-01-03 ENCOUNTER — Encounter: Payer: Self-pay | Admitting: Family Medicine

## 2023-01-05 ENCOUNTER — Institutional Professional Consult (permissible substitution): Payer: 59 | Admitting: Student in an Organized Health Care Education/Training Program

## 2023-01-07 ENCOUNTER — Other Ambulatory Visit
Admission: RE | Admit: 2023-01-07 | Discharge: 2023-01-07 | Disposition: A | Discharge status: Home / Self Care | Payer: 59 | Attending: Student in an Organized Health Care Education/Training Program | Admitting: Student in an Organized Health Care Education/Training Program

## 2023-01-07 ENCOUNTER — Ambulatory Visit (INDEPENDENT_AMBULATORY_CARE_PROVIDER_SITE_OTHER): Payer: 59 | Admitting: Student in an Organized Health Care Education/Training Program

## 2023-01-07 ENCOUNTER — Encounter: Payer: Self-pay | Admitting: Student in an Organized Health Care Education/Training Program

## 2023-01-07 VITALS — BP 110/78 | HR 88 | Temp 98.1°F | Ht 72.0 in | Wt 242.8 lb

## 2023-01-07 DIAGNOSIS — J4540 Moderate persistent asthma, uncomplicated: Secondary | ICD-10-CM | POA: Diagnosis not present

## 2023-01-07 DIAGNOSIS — R0602 Shortness of breath: Secondary | ICD-10-CM | POA: Insufficient documentation

## 2023-01-07 MED ORDER — AEROCHAMBER MV MISC: 0 refills | Status: DC

## 2023-01-07 MED ORDER — BUDESONIDE-FORMOTEROL FUMARATE 160-4.5 MCG/ACT IN AERO: 2.0000 | INHALATION_SPRAY | Freq: Two times a day (BID) | RESPIRATORY_TRACT | 12 refills | Status: DC

## 2023-01-07 NOTE — Progress Notes (Signed)
Synopsis: Referred in for shortness of breath by Mechele Claude, MD  Assessment & Plan:   #Moderate Persistent Asthma  Presenting for the evaluation of symptoms that are consistent with asthma. He's had episodic wheeze and cough that responded to steroids. Reviewed blood work showing notable eosinophilia, up to 700, consistent with t-helper cell type 2 response. Patient doesn't endorse any occupational exposures to suggest metal fume fever, pneumoconiosis, hypersensitivity pneumonitis, or eosinophlic pneumonia. His symptoms are better controlled, but he's had a recent course of steroids and antibiotics.  Given concern for asthma, we attempted FENO in clinic but he was unable to perform the maneuver. I will continue him on ICS/LABA with symbicort 160-4.5 two puffs twice daily and PRN with a spacer device - technique reviewed in the office today. I will also obtain a workup for t-helper cell response with an allergen panel, IgE, and ANCA titers. I've also ordered PFT's to assess spirometry for obstruction.  Finally, given differential includes hypersensitivity pneumonitis, eosinophilic pneumonia, and pneumoconiosis, I will obtain a high resolution chest CT to rule these out.  - Pulmonary Function Test [facility] Only; Future - Allergen Panel (27) + IGE; Future - CT CHEST HIGH RESOLUTION; Future - ANCA TITERS; Future - budesonide-formoterol (SYMBICORT) 160-4.5 MCG/ACT inhaler; Inhale 2 puffs into the lungs in the morning and at bedtime.  Dispense: 1 each; Refill: 12 - Spacer/Aero-Holding Chambers (AEROCHAMBER MV) inhaler; Use as instructed  Dispense: 1 each; Refill: 0   Return in about 4 weeks (around 02/04/2023).  I spent 63 minutes caring for this patient today, including preparing to see the patient, obtaining a medical history , reviewing a separately obtained history, performing a medically appropriate examination and/or evaluation, counseling and educating the patient/family/caregiver,  ordering medications, tests, or procedures, and documenting clinical information in the electronic health record  Raechel Chute, MD [facility]   End of visit medications:  Meds ordered this encounter  Medications   budesonide-formoterol (SYMBICORT) 160-4.5 MCG/ACT inhaler    Sig: Inhale 2 puffs into the lungs in the morning and at bedtime.    Dispense:  1 each    Refill:  12   Spacer/Aero-Holding Chambers (AEROCHAMBER MV) inhaler    Sig: Use as instructed    Dispense:  1 each    Refill:  0     Current Outpatient Medications:    albuterol (VENTOLIN HFA) 108 (90 Base) MCG/ACT inhaler, Inhale 2 puffs into the lungs every 4 (four) hours as needed for shortness of breath or wheezing., Disp: , Rfl:    atorvastatin (LIPITOR) 40 MG tablet, Take 1 tablet (40 mg total) by mouth daily., Disp: 90 tablet, Rfl: 3   benzonatate (TESSALON PERLES) 100 MG capsule, Take 1 capsule (100 mg total) by mouth 3 (three) times daily as needed for cough., Disp: 20 capsule, Rfl: 0   budesonide-formoterol (SYMBICORT) 160-4.5 MCG/ACT inhaler, Inhale 2 puffs into the lungs in the morning and at bedtime., Disp: 1 each, Rfl: 12   imipramine (TOFRANIL) 25 MG tablet, TAKE 1 TABLET BY MOUTH EVERYDAY AT BEDTIME, Disp: 90 tablet, Rfl: 1   levalbuterol (XOPENEX HFA) 45 MCG/ACT inhaler, Inhale 2 puffs into the lungs every 4 (four) hours as needed for wheezing., Disp: 1 each, Rfl: 12   moxifloxacin (AVELOX) 400 MG tablet, Take 1 tablet (400 mg total) by mouth daily., Disp: 10 tablet, Rfl: 0   phentermine 37.5 MG capsule, Take 1 capsule (37.5 mg total) by mouth every morning., Disp: 30 capsule, Rfl: 2  promethazine-dextromethorphan (PROMETHAZINE-DM) 6.25-15 MG/5ML syrup, Take 5 mLs by mouth 4 (four) times daily as needed for cough., Disp: 240 mL, Rfl: 0   sildenafil (VIAGRA) 100 MG tablet, TAKE 1/2 - 1 TABLET BY MOUTH DAILY AS NEEDED FOR ERECTILE DYSFUNCTION, Disp: 6 tablet, Rfl: 1   Spacer/Aero-Holding  Chambers (AEROCHAMBER MV) inhaler, Use as instructed, Disp: 1 each, Rfl: 0   Subjective:   PATIENT ID: Ryan Rojas GENDER: male DOB: 1965-08-29, MRN: [id number]  Chief Complaint  Patient presents with   Consult    Cough and shortness of breath at rest and exertion.     HPI  Patient is a pleasant 57 year old male presenting to clinic for the evaluation of shortness of breath, cough, and wheeze.  Patient is reporting symptoms that started around January of 2024. He's experienced multiple episodes of cough, shortness of breath, and wheeze requiring medical attention. The symptoms usually improve with prednisone, antibiotics, and albuterol, but have always recurred. He has not had sustained relief which has resulted in significant frustration. He reports no fevers, no chills, no night sweats, and no fevers. He's not had any signs or symptoms of systemic illness. His symptoms start with a cough that is productive of sputum, with associated wheeze. Review of dispense report shows prescriptions for steroids in June, August, and October. He's also received multiple courses of antibiotics, albuterol, and ICS/LABA ([place] - August). He was then started on Symbicort that he was using PRN.  Patient denies any work exposures, reviewed routine at length. 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 All work is done under a hood, does not do any welding or grinding, no exposure to fumes, rarely on the factory floor. He lives at home with his wife, moved to this house 10 years ago, without any changes. No water leaks or damage, no mold noted, and no carpet in the house. Does not have any pets. He's always worked for [employer]. Does use febreeze at home for smell as well as perfumes.Denies any smoking history. He was born in [place], lived in [place] briefly, and spent most of his formative years in [place]. Moved to [place] 10 years  ago.  Ancillary information including prior medications, full medical/surgical/family/social histories, and PFTs (when available) are listed below and have been reviewed.   Review of Systems  Constitutional:  Negative for chills, fever and weight loss.  Respiratory:  Positive for cough, sputum production, shortness of breath and wheezing.   Cardiovascular:  Negative for chest pain.     Objective:   Vitals:   01/07/23 1529  BP: 110/78  Pulse: 88  Temp: 98.1 F (36.7 C)  TempSrc: Temporal  SpO2: 97%  Weight: 242 lb 12.8 oz (110.1 kg)  Height: 6' (1.829 m)   97% on RA BMI Readings from Last 3 Encounters:  01/07/23 32.93 kg/m  12/30/22 33.01 kg/m  11/25/22 32.39 kg/m   Wt Readings from Last 3 Encounters:  01/07/23 242 lb 12.8 oz (110.1 kg)  12/30/22 243 lb 6.4 oz (110.4 kg)  11/25/22 238 lb 12.8 oz (108.3 kg)    Physical Exam Constitutional:      Appearance: Normal appearance. He is not ill-appearing.  Cardiovascular:     Rate and Rhythm: Normal rate and regular rhythm.     Pulses: Normal pulses.     Heart sounds: Normal heart sounds.  Pulmonary:     Breath sounds: No wheezing, rhonchi or rales.  Abdominal:  Palpations: Abdomen is soft.  Neurological:     General: No focal deficit present.     Mental Status: He is alert and oriented to person, place, and time. Mental status is at baseline.       Ancillary Information    Past Medical History:  Diagnosis Date   Back pain    Community acquired pneumonia 09/07/2022   Hyperlipidemia    Risk factors for obstructive sleep apnea 11/05/2021   S/P left rotator cuff repair 12/09/2021     Family History  Problem Relation Age of Onset   Asthma Mother    Diabetes Sister    Asthma Sister      Past Surgical History:  Procedure Laterality Date   COLONOSCOPY WITH PROPOFOL N/A 02/24/2022   Procedure: COLONOSCOPY WITH PROPOFOL;  Surgeon: Lanelle Bal, DO;  Location: [facility];  Service: Endoscopy;   Laterality: N/A;  10:30 AM, moved to 12/12 at 12:30   ROTATOR CUFF REPAIR Left 07/10/2021   November he had the Right one done    Social History   Socioeconomic History   Marital status: Single    Spouse name: Not on file   Number of children: 3   Years of education: Not on file   Highest education level: High school graduate  Occupational History   Not on file  Tobacco Use   Smoking status: Never   Smokeless tobacco: Never  Vaping Use   Vaping status: Never Used  Substance and Sexual Activity   Alcohol use: Yes    Alcohol/week: 1.0 standard drink of alcohol    Types: 1 Cans of beer per week   Drug use: No   Sexual activity: Yes    Birth control/protection: None  Other Topics Concern   Not on file  Social History Narrative   Lives alone   L handed   Caffeine: 1 C of coffee every AM   Social Determinants of Health   Financial Resource Strain: Not on file  Food Insecurity: Not on file  Transportation Needs: Not on file  Physical Activity: Not on file  Stress: Not on file  Social Connections: Not on file  Intimate Partner Violence: Not on file     Allergies  Allergen Reactions   Guaifenesin-Codeine Itching   Hydrocodone Itching   Oxycodone Hives     CBC    Component Value Date/Time   WBC 8.4 12/30/2022 1554   RBC 4.87 12/30/2022 1554   HGB 13.5 12/30/2022 1554   HCT 43.0 12/30/2022 1554   PLT 258 12/30/2022 1554   MCV 88 12/30/2022 1554   MCH 27.7 12/30/2022 1554   MCHC 31.4 (L) 12/30/2022 1554   RDW 14.3 12/30/2022 1554   LYMPHSABS 2.4 12/30/2022 1554   EOSABS 0.7 (H) 12/30/2022 1554   BASOSABS 0.1 12/30/2022 1554    Pulmonary Functions Testing Results:     No data to display          Outpatient Medications Prior to Visit  Medication Sig Dispense Refill   albuterol (VENTOLIN HFA) 108 (90 Base) MCG/ACT inhaler Inhale 2 puffs into the lungs every 4 (four) hours as needed for shortness of breath or wheezing.     atorvastatin (LIPITOR) 40 MG  tablet Take 1 tablet (40 mg total) by mouth daily. 90 tablet 3   benzonatate (TESSALON PERLES) 100 MG capsule Take 1 capsule (100 mg total) by mouth 3 (three) times daily as needed for cough. 20 capsule 0   imipramine (TOFRANIL) 25 MG tablet  TAKE 1 TABLET BY MOUTH EVERYDAY AT BEDTIME 90 tablet 1   levalbuterol (XOPENEX HFA) 45 MCG/ACT inhaler Inhale 2 puffs into the lungs every 4 (four) hours as needed for wheezing. 1 each 12   moxifloxacin (AVELOX) 400 MG tablet Take 1 tablet (400 mg total) by mouth daily. 10 tablet 0   phentermine 37.5 MG capsule Take 1 capsule (37.5 mg total) by mouth every morning. 30 capsule 2   promethazine-dextromethorphan (PROMETHAZINE-DM) 6.25-15 MG/5ML syrup Take 5 mLs by mouth 4 (four) times daily as needed for cough. 240 mL 0   sildenafil (VIAGRA) 100 MG tablet TAKE 1/2 - 1 TABLET BY MOUTH DAILY AS NEEDED FOR ERECTILE DYSFUNCTION 6 tablet 1   budesonide-formoterol (SYMBICORT) 160-4.5 MCG/ACT inhaler Inhale 2 puffs into the lungs 2 (two) times daily.     bisacodyl (DULCOLAX) 5 MG EC tablet Take 5 mg by mouth daily as needed for moderate constipation.     budesonide-formoterol (SYMBICORT) 160-4.5 MCG/ACT inhaler Inhale 2 puffs into the lungs 2 (two) times daily. 1 each 5   celecoxib (CELEBREX) 100 MG capsule TAKE 1 CAPSULE BY MOUTH 2 TIMES DAILY AS NEEDED. 60 capsule 3   diazepam (VALIUM) 2 MG tablet Take one pill one hour prior to injection and then repeat just prior to injection if needed 20 tablet 0   ondansetron (ZOFRAN) 4 MG tablet Take 1 tablet (4 mg total) by mouth every 8 (eight) hours as needed for nausea or vomiting. 40 tablet 0   No facility-administered medications prior to visit.

## 2023-01-11 ENCOUNTER — Ambulatory Visit: Payer: 59 | Admitting: Family Medicine

## 2023-01-11 LAB — ANCA TITERS
Atypical P-ANCA titer: <1:20 {titer}
C-ANCA: <1:20 {titer}
P-ANCA: <1:20 {titer}

## 2023-01-13 LAB — ALLERGEN PANEL (27) + IGE
Alternaria Alternata IgE: <0.1 kU/L
Aspergillus Fumigatus IgE: <0.1 kU/L
Bahia Grass IgE: 0.1 kU/L — AB
Bermuda Grass IgE: 0.14 kU/L — AB
Cat Dander IgE: 0.5 kU/L — AB
Cedar, Mountain IgE: 0.15 kU/L — AB
Cladosporium Herbarum IgE: <0.1 kU/L
Cocklebur IgE: <0.1 kU/L
Cockroach, American IgE: <0.1 kU/L
Common Silver Birch IgE: 0.11 kU/L — AB
D Farinae IgE: <0.1 kU/L
D Pteronyssinus IgE: 0.16 kU/L — AB
Dog Dander IgE: 0.32 kU/L — AB
Elm, American IgE: 0.17 kU/L — AB
Hickory, White IgE: 0.18 kU/L — AB
IgE (Immunoglobulin E), Serum: 135 [IU]/mL (ref 6–495)
Johnson Grass IgE: 0.12 kU/L — AB
Kentucky Bluegrass IgE: 0.12 kU/L — AB
Maple/Box Elder IgE: 0.13 kU/L — AB
Mucor Racemosus IgE: <0.1 kU/L
Oak, White IgE: 0.11 kU/L — AB
Penicillium Chrysogen IgE: <0.1 kU/L
Pigweed, Rough IgE: <0.1 kU/L
Plantain, English IgE: 0.11 kU/L — AB
Ragweed, Short IgE: 0.13 kU/L — AB
Setomelanomma Rostrat: <0.1 kU/L
Timothy Grass IgE: 0.1 kU/L — AB
White Mulberry IgE: <0.1 kU/L

## 2023-01-26 ENCOUNTER — Encounter: Payer: Self-pay | Admitting: Nurse Practitioner

## 2023-01-26 ENCOUNTER — Ambulatory Visit (INDEPENDENT_AMBULATORY_CARE_PROVIDER_SITE_OTHER): Payer: 59 | Admitting: Nurse Practitioner

## 2023-01-26 ENCOUNTER — Ambulatory Visit
Admission: RE | Admit: 2023-01-26 | Discharge: 2023-01-26 | Disposition: A | Discharge status: Home / Self Care | Payer: 59 | Source: Ambulatory Visit | Attending: Nurse Practitioner

## 2023-01-26 ENCOUNTER — Ambulatory Visit
Admission: RE | Admit: 2023-01-26 | Discharge: 2023-01-26 | Disposition: A | Discharge status: Home / Self Care | Payer: 59 | Source: Ambulatory Visit | Attending: Nurse Practitioner | Admitting: Nurse Practitioner

## 2023-01-26 VITALS — BP 138/80 | HR 88 | Temp 97.6°F | Ht 72.0 in | Wt 246.6 lb

## 2023-01-26 DIAGNOSIS — R0602 Shortness of breath: Secondary | ICD-10-CM | POA: Diagnosis not present

## 2023-01-26 DIAGNOSIS — J4551 Severe persistent asthma with (acute) exacerbation: Secondary | ICD-10-CM | POA: Diagnosis present

## 2023-01-26 DIAGNOSIS — J45909 Unspecified asthma, uncomplicated: Secondary | ICD-10-CM | POA: Insufficient documentation

## 2023-01-26 DIAGNOSIS — J45901 Unspecified asthma with (acute) exacerbation: Secondary | ICD-10-CM | POA: Insufficient documentation

## 2023-01-26 MED ORDER — METHYLPREDNISOLONE ACETATE 80 MG/ML IJ SUSP
120.0000 mg | Freq: Once | INTRAMUSCULAR | Status: AC
  Administered 2023-01-26: 120 mg via INTRAMUSCULAR

## 2023-01-26 MED ORDER — BUDESONIDE-FORMOTEROL FUMARATE 160-4.5 MCG/ACT IN AERO: 2.0000 | INHALATION_SPRAY | Freq: Two times a day (BID) | RESPIRATORY_TRACT | 12 refills | Status: DC

## 2023-01-26 MED ORDER — IPRATROPIUM-ALBUTEROL 0.5-2.5 (3) MG/3ML IN SOLN
3.0000 mL | Freq: Once | RESPIRATORY_TRACT | Status: AC
  Administered 2023-01-26: 3 mL via RESPIRATORY_TRACT

## 2023-01-26 MED ORDER — PREDNISONE 10 MG PO TABS
10.0000 mg | ORAL_TABLET | Freq: Every day | ORAL | 0 refills | Status: DC
Start: 2023-02-03 — End: 2023-02-16

## 2023-01-26 MED ORDER — ALBUTEROL SULFATE HFA 108 (90 BASE) MCG/ACT IN AERS: 2.0000 | INHALATION_SPRAY | RESPIRATORY_TRACT | 2 refills | Status: DC | PRN

## 2023-01-26 MED ORDER — PREDNISONE 10 MG PO TABS: ORAL_TABLET | ORAL | 0 refills | Status: DC

## 2023-01-26 MED ORDER — IPRATROPIUM-ALBUTEROL 0.5-2.5 (3) MG/3ML IN SOLN: 3.0000 mL | Freq: Four times a day (QID) | RESPIRATORY_TRACT | 2 refills | Status: DC | PRN

## 2023-01-26 MED ORDER — MONTELUKAST SODIUM 10 MG PO TABS: 10.0000 mg | ORAL_TABLET | Freq: Every day | ORAL | 5 refills | Status: DC

## 2023-01-26 MED ORDER — BUDESONIDE 0.5 MG/2ML IN SUSP: 0.5000 mg | Freq: Two times a day (BID) | RESPIRATORY_TRACT | 11 refills | Status: DC

## 2023-01-26 NOTE — Patient Instructions (Addendum)
Continue Albuterol inhaler 2 puffs or duoneb 3 mL neb every 6 hours as needed for shortness of breath or wheezing. Notify if symptoms persist despite rescue inhaler/neb use. Use duoneb at least 3 times a day until symptoms improve  Continue Symbicort 2 puffs Twice daily. Brush tongue and rinse mouth afterwards   -Start daily allergy pill such as claritin, zyrtec, xyzal or allegra. Store brand equivalent is fine.  -Start singulair 1 tab At bedtime. Monitor for any mood changes and stop and notify immediately if these occur -Prednisone taper. 4 tabs for 3 days, then 3 tabs for 3 days, 2 tabs for 3 days, then stay on 1 tab daily until you are seen back. Take in AM with food. Start tomorrow  -Budesonide nebs 2 mL Twice daily. Use consistently over the next few days and then you can move to as needed for shortness of breath, wheezing, cough. Brush tongue and rinse mouth afterwards   Chest x ray today  Attend High resolution CT chest and PFT as previously scheduled   Follow up with Katie Gerald Honea,NP on 11/22. Ok to double book. If symptoms do not improve or worsen, please contact office for sooner follow up or seek emergency care.

## 2023-01-26 NOTE — Progress Notes (Signed)
@Patient  ID: Ryan Rojas, male    DOB: 08-17-1965, 57 y.o.   MRN: [id number]  Chief Complaint  Patient presents with   Follow-up    Cough, shortness of breath and wheezing.     Referring provider: Mechele Claude, MD  HPI: 57 year old male, never smoker followed for asthma.  He is a patient of Dr. Doreene Rojas and last seen in office for pulmonary consult 01/07/2023.  Past medical history significant for OSA, HLD insomnia, HLD  TEST/EVENTS:  09/07/2022 CXR: Both lungs are clear 11/02/2022 CXR: Both lungs are clear 12/30/2022 eos 700 01/07/2023 ANCA negative; positive RAST to dust, cat dander, dog dander, multiple grasses, multiple trees  01/07/2023: OV with Dr. Aundria Rojas for pulmonary consultation.  Experiences shortness of breath, cough and wheeze.  Symptoms started around January 2024.  Has had multiple episodes of cough, shortness of breath and wheezing which required medical attention.  He has been treated with prednisone, antibiotics and albuterol with improvement.  Unfortunately symptoms have always recurred.  Symptoms typically start with a cough that is productive and associated wheeze.  Started on Wink in August.  Then started on Symbicort that he was using as needed.  No significant work exposures.  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  All work done under a hood, does not do any welding or grinding.  No exposure to fumes.  Rarely on the factory floor.  No pets. Moved to [place] 10 years ago.  Review of blood work shows notable eosinophilia, up to 700.  Symptoms are currently better controlled.  Has had a recent course steroids and antibiotics.  Given concern for asthma, attempted FeNO but patient was unable to perform maneuver.  Continued on Symbicort twice daily and as needed with spacer.  Technique reviewed in office.  Allergen panel, IgE and ANCA titers ordered.  PFTs and HRCT ordered for further evaluation.   01/26/2023: Today - follow up Discussed the use of AI scribe software  for clinical note transcription with the patient, who gave verbal consent to proceed.  History of Present Illness   Ryan Rojas presents today for acute visit with his wife. He was last seen in 10/24 for initial consult. He had just completed abx and steroids, and symptoms were stable at that time. He unfortunately feels like over the last week or so, he's progressively worsened. He ran out of his inhaler yesterday. He did feel like it was helping but has never felt entirely back to his normal. He's been using the Symbicort multiple times a day. He has a lot of trouble with his breathing. Feels like he hasn't been able to do much of anything because of it. Accompanying symptoms include a cough, with occasional expectoration of yellow sputum, chest tightness, and a recent onset of hoarseness. He is also wheezing. The patient denies any leg swelling, weight gain, fevers, chills, hemoptysis. He does not have a nebulizer at home.   The patient's respiratory issues appear to have an allergic component with positive RAST. The patient reports occasional sinus symptoms with nasal congestion and drainage. No headaches, sore throat.       Allergies  Allergen Reactions   Guaifenesin-Codeine Itching   Hydrocodone Itching   Oxycodone Hives    Immunization History  Administered Date(s) Administered   PFIZER(Purple Top)SARS-COV-2 Vaccination 08/18/2019, 09/08/2019   Td 01/05/2001   Tdap 09/27/2019    Past Medical History:  Diagnosis Date   Back pain    Community acquired pneumonia 09/07/2022   Hyperlipidemia  Risk factors for obstructive sleep apnea 11/05/2021   S/P left rotator cuff repair 12/09/2021    Tobacco History: Social History   Tobacco Use  Smoking Status Never  Smokeless Tobacco Never   Counseling given: Not Answered   Outpatient Medications Prior to Visit  Medication Sig Dispense Refill   albuterol (VENTOLIN HFA) 108 (90 Base) MCG/ACT inhaler Inhale 2 puffs into the lungs  every 4 (four) hours as needed for shortness of breath or wheezing.     atorvastatin (LIPITOR) 40 MG tablet Take 1 tablet (40 mg total) by mouth daily. 90 tablet 3   benzonatate (TESSALON PERLES) 100 MG capsule Take 1 capsule (100 mg total) by mouth 3 (three) times daily as needed for cough. 20 capsule 0   budesonide-formoterol (SYMBICORT) 160-4.5 MCG/ACT inhaler Inhale 2 puffs into the lungs in the morning and at bedtime. 1 each 12   imipramine (TOFRANIL) 25 MG tablet TAKE 1 TABLET BY MOUTH EVERYDAY AT BEDTIME 90 tablet 1   levalbuterol (XOPENEX HFA) 45 MCG/ACT inhaler Inhale 2 puffs into the lungs every 4 (four) hours as needed for wheezing. 1 each 12   moxifloxacin (AVELOX) 400 MG tablet Take 1 tablet (400 mg total) by mouth daily. 10 tablet 0   phentermine 37.5 MG capsule Take 1 capsule (37.5 mg total) by mouth every morning. 30 capsule 2   promethazine-dextromethorphan (PROMETHAZINE-DM) 6.25-15 MG/5ML syrup Take 5 mLs by mouth 4 (four) times daily as needed for cough. 240 mL 0   sildenafil (VIAGRA) 100 MG tablet TAKE 1/2 - 1 TABLET BY MOUTH DAILY AS NEEDED FOR ERECTILE DYSFUNCTION 6 tablet 1   Spacer/Aero-Holding Chambers (AEROCHAMBER MV) inhaler Use as instructed 1 each 0   No facility-administered medications prior to visit.     Review of Systems:   Constitutional: No weight loss or gain, night sweats, fevers, chills, fatigue, or lassitude. HEENT: No headaches, difficulty swallowing, tooth/dental problems, or sore throat. No sneezing, itching, ear ache. +nasal congestion/drainage, post nasal drip, hoarse voice quality  CV:  No chest pain, orthopnea, PND, swelling in lower extremities, anasarca, dizziness, palpitations, syncope Resp: +shortness of breath with exertion; productive cough; wheezing; chest tightness. No hemoptysis. No chest wall deformity GI:  No heartburn, indigestion, abdominal pain, nausea, vomiting, diarrhea, change in bowel habits, loss of appetite, bloody stools.  GU:  No dysuria, change in color of urine, urgency or frequency.  Skin: No rash, lesions, ulcerations MSK:  No joint pain or swelling.   Neuro: No dizziness or lightheadedness.  Psych: No depression or anxiety. Mood stable.     Physical Exam:  BP 138/80 (BP Location: Left Arm, Patient Position: Sitting, Cuff Size: Normal)   Pulse 88   Temp 97.6 F (36.4 C) (Temporal)   Ht 6' (1.829 m)   Wt 246 lb 9.6 oz (111.9 kg)   SpO2 95%   BMI 33.44 kg/m   GEN: Pleasant, interactive, well-kempt; obese; in mild respiratory distress upon arrival, improved post neb HEENT:  Normocephalic and atraumatic. PERRLA. Sclera white. Nasal turbinates erythematous, moist and patent bilaterally. Clear rhinorrhea present. Oropharynx pink and moist, without exudate or edema. No lesions, ulcerations NECK:  Supple w/ fair ROM. No JVD present. Normal carotid impulses w/o bruits. Thyroid symmetrical with no goiter or nodules palpated. No lymphadenopathy.   CV: RRR, no m/r/g, no peripheral edema. Pulses intact, +2 bilaterally. No cyanosis, pallor or clubbing. PULMONARY:  Labored breathing. Tachypnea. Diminished bilaterally with inspiratory and expiratory wheezes A&P. No accessory muscle use.  GI: BS present  and normoactive. Soft, non-tender to palpation. No organomegaly or masses detected. MSK: No erythema, warmth or tenderness. Cap refil <2 sec all extrem. No deformities or joint swelling noted.  Neuro: A/Ox3. No focal deficits noted.   Skin: Warm, no lesions or rashe Psych: Normal affect and behavior. Judgement and thought content appropriate.     Lab Results:  CBC    Component Value Date/Time   WBC 8.4 12/30/2022 1554   RBC 4.87 12/30/2022 1554   HGB 13.5 12/30/2022 1554   HCT 43.0 12/30/2022 1554   PLT 258 12/30/2022 1554   MCV 88 12/30/2022 1554   MCH 27.7 12/30/2022 1554   MCHC 31.4 (L) 12/30/2022 1554   RDW 14.3 12/30/2022 1554   LYMPHSABS 2.4 12/30/2022 1554   EOSABS 0.7 (H) 12/30/2022 1554    BASOSABS 0.1 12/30/2022 1554    BMET    Component Value Date/Time   NA 140 12/30/2022 1554   K 4.3 12/30/2022 1554   CL 101 12/30/2022 1554   CO2 24 12/30/2022 1554   GLUCOSE 72 12/30/2022 1554   BUN 15 12/30/2022 1554   CREATININE 1.09 12/30/2022 1554   CALCIUM 9.1 12/30/2022 1554   GFRNONAA 78 09/27/2019 0914   GFRAA 90 09/27/2019 0914    BNP No results found for: "BNP"   Imaging:  No results found.  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 Mild acute respiratory distress upon arrival. Clinical improvement following duoneb x 1 in office. VS stable on room air. Discussed risks of untreated exacerbation. Benefits of treatment include improved airway function and symptom relief. Provided with strict ED precautions. Believe he is appropriate to manage outpatient at this point. He is very steroid responsive. Plan to treat with depo 120 mg inj x 1, prednisone taper and then transition to low dose daily prednisone until seen back. Initiate allergy control/trigger prevention with daily non-drowsy antihistamine and Singulair. Side effect profile reviewed. Optimize management with budesonide nebs and duonebs PRN. Discussed potential need for biologic therapy pending CT scan results to rule out other conditions mimicking asthma. Lower suspicion for ABPA given minimally elevated IgE; although could be falsely lowered due to steroid use. May want to consider aspergillus panel if no improvement or symptoms recur. Action plan in place. Close follow up.  - Administer DuoNeb breathing treatment x 1 - Administer Depo-Medrol  injection 120 mg inj x 1  - Start prednisone taper and transition to low dose 10 mg prednisone daily until follow up - Prescribe budesonide nebulizer twice daily PRN SOB, wheeze, cough - Prescribe montelukast at bedtime - Provide home nebulizer and duoneb solution - q 6 hr PRN SOB, cough, wheeze.  - Continue with scheduled HRCT and PFT on the 19th  Allergic rhinitis Underlying allergic component contributing to asthma symptoms. Discussed that untreated allergies can drive inflammation and worsen asthma symptoms. - Start daily non-drowsy antihistamine OTC   Follow-up - Follow-up 11/22 - Keep appt for HRCT chest and PFT      Advised if symptoms do not improve or worsen, to please contact office  for sooner follow up or seek emergency care.   I spent 45 minutes of dedicated to the care of this patient on the date of this encounter to include pre-visit review of records, face-to-face time with the patient discussing conditions above, post visit ordering of testing, clinical documentation with the electronic health record, making appropriate referrals as documented, and communicating necessary findings to members of the patients care team.  Noemi Chapel, NP 01/26/2023  Pt aware and understands NP's role.

## 2023-01-28 ENCOUNTER — Other Ambulatory Visit (HOSPITAL_COMMUNITY): Payer: Self-pay

## 2023-01-28 ENCOUNTER — Telehealth: Payer: Self-pay

## 2023-01-28 DIAGNOSIS — J4551 Severe persistent asthma with (acute) exacerbation: Secondary | ICD-10-CM

## 2023-01-28 NOTE — Telephone Encounter (Signed)
Looks like the generic is not covered either. I will reach out to the pharmacy team to find a covered alternative.  Pharmacy team please check with insurance and see what a covered alternative to the Symbicort is. Thank you!

## 2023-01-28 NOTE — Telephone Encounter (Signed)
I have notified the patient. He said he has not been about to get the Albuterol or Symbicort inhalers and his pharmacy has sent a message asking for the generics to be sent in.  Katie have you received anything?

## 2023-01-28 NOTE — Telephone Encounter (Signed)
-----   Message from Noemi Chapel sent at 01/28/2023  3:16 PM EST ----- No pneumonia on CXR. He has some changes of bronchitis due to his asthma. Continue our plan as discussed. Thanks.

## 2023-01-28 NOTE — Progress Notes (Signed)
No pneumonia on CXR. He has some changes of bronchitis due to his asthma. Continue our plan as discussed. Thanks.

## 2023-01-28 NOTE — Telephone Encounter (Signed)
Test claim returns the following for inhalers in the same class as Symbicort.  Advair HFA  brand non-formulary  generic non-formulary Advair Diskus brand non-formulary  generic $0.00 Wixela $0.00 Breo $0.00 Dulera  $0.00

## 2023-01-28 NOTE — Telephone Encounter (Signed)
I have not gotten any requests. Fine with me to send generic Symbicort 160 mcg 2 puffs Twice daily. The albuterol should've already been generic but ok to specify not brand specific.

## 2023-01-29 MED ORDER — MOMETASONE FURO-FORMOTEROL FUM 200-5 MCG/ACT IN AERO: 2.0000 | INHALATION_SPRAY | Freq: Two times a day (BID) | RESPIRATORY_TRACT | 12 refills | Status: DC

## 2023-01-29 MED ORDER — FLUTICASONE-SALMETEROL 230-21 MCG/ACT IN AERO: 2.0000 | INHALATION_SPRAY | Freq: Two times a day (BID) | RESPIRATORY_TRACT | 12 refills | Status: DC

## 2023-01-29 NOTE — Telephone Encounter (Signed)
I sent Dulera HFA 200 mcg 2 puffs Twice daily. Clean mouth afterwards. Thanks!

## 2023-02-01 NOTE — Telephone Encounter (Addendum)
Patient is aware of below message/recommendations and voiced his understanding.  Nothing further needed.  

## 2023-02-02 ENCOUNTER — Ambulatory Visit
Admission: RE | Admit: 2023-02-02 | Discharge: 2023-02-02 | Disposition: A | Discharge status: Home / Self Care | Payer: 59 | Source: Ambulatory Visit | Attending: Family Medicine | Admitting: Family Medicine

## 2023-02-02 ENCOUNTER — Ambulatory Visit (HOSPITAL_BASED_OUTPATIENT_CLINIC_OR_DEPARTMENT_OTHER): Payer: 59

## 2023-02-02 DIAGNOSIS — R0602 Shortness of breath: Secondary | ICD-10-CM

## 2023-02-02 LAB — PULMONARY FUNCTION TEST ARMC ONLY
DL/VA % pred: 119 %
DL/VA: 5.12 ml/min/mmHg/L
DLCO unc % pred: 106 %
DLCO unc: 32.38 ml/min/mmHg
FEF 25-75 Post: 4.09 L/s
FEF 25-75 Pre: 1.85 L/s
FEF2575-%Change-Post: 121 %
FEF2575-%Pred-Post: 119 %
FEF2575-%Pred-Pre: 54 %
FEV1-%Change-Post: 25 %
FEV1-%Pred-Post: 94 %
FEV1-%Pred-Pre: 75 %
FEV1-Post: 3.81 L
FEV1-Pre: 3.03 L
FEV1FVC-%Change-Post: 15 %
FEV1FVC-%Pred-Pre: 89 %
FEV6-%Change-Post: 9 %
FEV6-%Pred-Post: 93 %
FEV6-%Pred-Pre: 85 %
FEV6-Post: 4.74 L
FEV6-Pre: 4.32 L
FEV6FVC-%Change-Post: 0 %
FEV6FVC-%Pred-Post: 103 %
FEV6FVC-%Pred-Pre: 102 %
FVC-%Change-Post: 9 %
FVC-%Pred-Post: 90 %
FVC-%Pred-Pre: 83 %
FVC-Post: 4.79 L
FVC-Pre: 4.39 L
Post FEV1/FVC ratio: 80 %
Post FEV6/FVC ratio: 99 %
Pre FEV1/FVC ratio: 69 %
Pre FEV6/FVC Ratio: 98 %
RV % pred: 90 %
RV: 2.03 L
TLC % pred: 88 %
TLC: 6.56 L

## 2023-02-02 MED ORDER — ALBUTEROL SULFATE (2.5 MG/3ML) 0.083% IN NEBU
2.5000 mg | INHALATION_SOLUTION | Freq: Once | RESPIRATORY_TRACT | Status: AC
  Administered 2023-02-02: 2.5 mg via RESPIRATORY_TRACT
  Filled 2023-02-02: qty 3

## 2023-02-03 ENCOUNTER — Other Ambulatory Visit: Payer: Self-pay | Admitting: Family Medicine

## 2023-02-03 ENCOUNTER — Ambulatory Visit: Payer: 59 | Admitting: Student in an Organized Health Care Education/Training Program

## 2023-02-03 DIAGNOSIS — N5203 Combined arterial insufficiency and corporo-venous occlusive erectile dysfunction: Secondary | ICD-10-CM

## 2023-02-05 ENCOUNTER — Ambulatory Visit (INDEPENDENT_AMBULATORY_CARE_PROVIDER_SITE_OTHER): Payer: 59 | Admitting: Nurse Practitioner

## 2023-02-05 ENCOUNTER — Encounter: Payer: Self-pay | Admitting: Nurse Practitioner

## 2023-02-05 VITALS — BP 138/86 | HR 80 | Temp 97.1°F | Ht 72.0 in | Wt 242.8 lb

## 2023-02-05 DIAGNOSIS — J3089 Other allergic rhinitis: Secondary | ICD-10-CM | POA: Diagnosis not present

## 2023-02-05 DIAGNOSIS — J4550 Severe persistent asthma, uncomplicated: Secondary | ICD-10-CM

## 2023-02-05 DIAGNOSIS — J309 Allergic rhinitis, unspecified: Secondary | ICD-10-CM | POA: Insufficient documentation

## 2023-02-05 NOTE — Assessment & Plan Note (Signed)
Significant improvement with Zyrtec and Singulair.  Continue current regimen.

## 2023-02-05 NOTE — Patient Instructions (Addendum)
-  Continue Albuterol inhaler 2 puffs or duoneb 3 mL neb every 6 hours as needed for shortness of breath or wheezing. Notify if symptoms persist despite rescue inhaler/neb use.  -Continue Zyrtec daily -Continue singulair (montelukast) 1 tab At bedtime  -Continue Budesonide nebs 2 mL Twice daily as needed for shortness of breath or wheezing. Brush tongue and rinse mouth afterwards  -Start Dulera 2 puffs Twice daily. Brush tongue and rinse mouth afterwards. If you haven't gotten this by Monday, call your pharmacy. Let me know if we need to change pharmacies at this point   -Continue prednisone 10 mg daily until you start on your [facility]. After you are on the [facility] for a week and if still feeling well, decrease prednisone to 1/2 tablet daily until I see you back. Take in AM with food   Pulmonary testing consistent with asthma diagnosis  Final read on your high-resolution CT scan is not back yet.  We will call you with these final results.  Asthma action plan: START nebs up to four times a day for worsening shortness of breath, wheezing and cough. If you symptoms do not improve in 24-48 hours, contact us for steroid course. If your symptoms rapidly worsen, you have trouble talking or extreme difficulty breathing, or you're not getting relief from your nebulizer/rescue, go to the emergency department.   Follow up in 4-5 weeks with Dr. Aundria Rud or Philis Nettle. Ok to double book [facility] on day not already double booked. If symptoms do not improve or worsen, please contact office for sooner follow up or seek emergency care.

## 2023-02-05 NOTE — Progress Notes (Signed)
@Patient  ID: Ryan Rojas, male    DOB: [birth year], 57 y.o.   MRN: [id number]  Chief Complaint  Patient presents with   Follow-up    DOE. No wheezing. Dry cough.    Referring provider: Mechele Claude, MD  HPI: 57 year old male, never smoker followed for asthma.  He is a patient of Dr. Doreene Adas and last seen in office for pulmonary consult 01/26/2023.  Past medical history significant for OSA, HLD insomnia, HLD  TEST/EVENTS:  09/07/2022 CXR: Both lungs are clear 11/02/2022 CXR: Both lungs are clear 12/30/2022 eos 700 01/07/2023 ANCA negative; positive RAST to dust, cat dander, dog dander, multiple grasses, multiple trees 01/26/2023 CXR: prominent perihilar opacities, likely related to small airways disease. No focal airspace opacities  02/02/2023 PFT: FVC 89, FEV1 75, ratio 80, TLC 88, DLCO 106. Moderate obstruction prior to bronchodilators that corrects to normal following albuterol administration. Significant BD (25% change), consistent with asthma. Normal lung volumes. No diffusion defect  01/07/2023: OV with Dr. Aundria Rud for pulmonary consultation.  Experiences shortness of breath, cough and wheeze.  Symptoms started around January 2024.  Has had multiple episodes of cough, shortness of breath and wheezing which required medical attention.  He has been treated with prednisone, antibiotics and albuterol with improvement.  Unfortunately symptoms have always recurred.  Symptoms typically start with a cough that is productive and associated wheeze.  Started on [place] in August.  Then started on Symbicort that he was using as needed.  No significant work exposures.  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  All work done under a hood, does not do any welding or grinding.  No exposure to fumes.  Rarely on the factory floor.  No pets. Moved to [place] 10 years ago.  Review of blood work shows notable eosinophilia, up to 700.  Symptoms are currently better controlled.  Has had a recent course  steroids and antibiotics.  Given concern for asthma, attempted FeNO but patient was unable to perform maneuver.  Continued on Symbicort twice daily and as needed with spacer.  Technique reviewed in office.  Allergen panel, IgE and ANCA titers ordered.  PFTs and HRCT ordered for further evaluation.   01/26/2023: OV with Briah Nary NP. Mr. Faison presents today for acute visit with his wife. He was last seen in 10/24 for initial consult. He had just completed abx and steroids, and symptoms were stable at that time. He unfortunately feels like over the last week or so, he's progressively worsened. He ran out of his inhaler yesterday. He did feel like it was helping but has never felt entirely back to his normal. He's been using the Symbicort multiple times a day. He has a lot of trouble with his breathing. Feels like he hasn't been able to do much of anything because of it. Accompanying symptoms include a cough, with occasional expectoration of yellow sputum, chest tightness, and a recent onset of hoarseness. He is also wheezing. The patient denies any leg swelling, weight gain, fevers, chills, hemoptysis. He does not have a nebulizer at home.    The patient's respiratory issues appear to have an allergic component with positive RAST. The patient reports occasional sinus symptoms with nasal congestion and drainage. No headaches, sore throat.   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 Mild acute respiratory distress upon arrival. Clinical improvement following duoneb x 1 in office. VS stable on room air. Discussed risks of untreated exacerbation. Benefits of treatment include improved airway function and symptom relief. Provided with strict  ED precautions. Believe he is appropriate to manage outpatient at this point. He is very steroid responsive. Plan to treat with depo 120 mg inj x 1, prednisone taper and then transition to low dose daily prednisone until seen back. Initiate allergy  control/trigger prevention with daily non-drowsy antihistamine and Singulair. Side effect profile reviewed. Optimize management with budesonide nebs and duonebs PRN. Discussed potential need for biologic therapy pending CT scan results to rule out other conditions mimicking asthma. Lower suspicion for ABPA given minimally elevated IgE; although could be falsely lowered due to steroid use. May want to consider aspergillus panel if no improvement or symptoms recur. Action plan in place. Close follow up.   02/05/2023: Today - follow up Patient is in today for follow-up.  Since he was here last, he had pulmonary function testing that showed significant bronchodilator response with 25% change following albuterol administration, consistent with asthma diagnosis.  He also had high-resolution CT chest but formal read is not back on this yet.  He is feeling much better compared to when I saw him 10 days ago.  He completed prednisone taper and is currently taking 10 mg daily.  He unfortunately never received the inhaler that he was prescribed.  Advair was not covered.  Test claims showed that [facility] was.  Elwin Sleight was sent in but his pharmacy has yet to contact him about this.  He also has yet to get his neb machine.  It is best becoming in the mail today.  He did start the Singulair.  Breathing feels like it is getting back to his baseline.  He is not noticing any wheezing, chest congestion and cough has mostly resolved.  He has not had any fevers, chills, hemoptysis, lower extremity swelling, night sweats. He's not had to use his rescue inhaler much over the past few days. Sinus symptoms feel better. Voice is not hoarse anymore. He is taking Zyrtec also.   Allergies  Allergen Reactions   Guaifenesin-Codeine Itching   Hydrocodone Itching   Oxycodone Hives    Immunization History  Administered Date(s) Administered   PFIZER(Purple Top)SARS-COV-2 Vaccination 08/18/2019, 09/08/2019   Td 01/05/2001   Tdap 09/27/2019     Past Medical History:  Diagnosis Date   Back pain    Community acquired pneumonia 09/07/2022   Hyperlipidemia    Risk factors for obstructive sleep apnea 11/05/2021   S/P left rotator cuff repair 12/09/2021    Tobacco History: Social History   Tobacco Use  Smoking Status Never  Smokeless Tobacco Never   Counseling given: Not Answered   Outpatient Medications Prior to Visit  Medication Sig Dispense Refill   albuterol (VENTOLIN HFA) 108 (90 Base) MCG/ACT inhaler Inhale 2 puffs into the lungs every 4 (four) hours as needed for shortness of breath or wheezing. 8 g 2   atorvastatin (LIPITOR) 40 MG tablet Take 1 tablet (40 mg total) by mouth daily. 90 tablet 3   cetirizine (ZYRTEC) 10 MG tablet Take 10 mg by mouth daily.     imipramine (TOFRANIL) 25 MG tablet TAKE 1 TABLET BY MOUTH EVERYDAY AT BEDTIME 90 tablet 1   montelukast (SINGULAIR) 10 MG tablet Take 1 tablet (10 mg total) by mouth at bedtime. 30 tablet 5   moxifloxacin (AVELOX) 400 MG tablet Take 1 tablet (400 mg total) by mouth daily. 10 tablet 0   phentermine 37.5 MG capsule Take 1 capsule (37.5 mg total) by mouth every morning. 30 capsule 2   predniSONE (DELTASONE) 10 MG tablet Take 1 tablet (  10 mg total) by mouth daily with breakfast. 30 tablet 0   promethazine-dextromethorphan (PROMETHAZINE-DM) 6.25-15 MG/5ML syrup Take 5 mLs by mouth 4 (four) times daily as needed for cough. 240 mL 0   sildenafil (VIAGRA) 100 MG tablet TAKE 1/2 - 1 TABLET BY MOUTH DAILY AS NEEDED FOR ERECTILE DYSFUNCTION 6 tablet 11   Spacer/Aero-Holding Chambers (AEROCHAMBER MV) inhaler Use as instructed 1 each 0   benzonatate (TESSALON PERLES) 100 MG capsule Take 1 capsule (100 mg total) by mouth 3 (three) times daily as needed for cough. (Patient not taking: Reported on 02/05/2023) 20 capsule 0   budesonide (PULMICORT) 0.5 MG/2ML nebulizer solution Take 2 mLs (0.5 mg total) by nebulization in the morning and at bedtime. (Patient not taking: Reported  on 02/05/2023) 120 mL 11   ipratropium-albuterol (DUONEB) 0.5-2.5 (3) MG/3ML SOLN Take 3 mLs by nebulization every 6 (six) hours as needed (shortness of breath, wheezing, cough). (Patient not taking: Reported on 02/05/2023) 360 mL 2   levalbuterol (XOPENEX HFA) 45 MCG/ACT inhaler Inhale 2 puffs into the lungs every 4 (four) hours as needed for wheezing. (Patient not taking: Reported on 02/05/2023) 1 each 12   mometasone-formoterol (DULERA) 200-5 MCG/ACT AERO Inhale 2 puffs into the lungs in the morning and at bedtime. (Patient not taking: Reported on 02/05/2023) 39 g 12   predniSONE (DELTASONE) 10 MG tablet 4 tabs for 3 days, then 3 tabs for 3 days, 2 tabs for 3 days, then stay on 1 tab daily (Patient not taking: Reported on 02/05/2023) 30 tablet 0   No facility-administered medications prior to visit.     Review of Systems:   Constitutional: No weight loss or gain, night sweats, fevers, chills, fatigue, or lassitude. HEENT: No headaches, difficulty swallowing, tooth/dental problems, or sore throat. No sneezing, itching, ear ache. +nasal congestion/drainage, post nasal drip, hoarse voice quality (resolved) CV:  No chest pain, orthopnea, PND, swelling in lower extremities, anasarca, dizziness, palpitations, syncope Resp: +shortness of breath with exertion (improved). Resolved cough. No wheezing. No hemoptysis. No chest wall deformity GI:  No heartburn, indigestion, abdominal pain, nausea, vomiting, diarrhea, change in bowel habits, loss of appetite, bloody stools.  GU: No dysuria, change in color of urine, urgency or frequency.  Skin: No rash, lesions, ulcerations MSK:  No joint pain or swelling.   Neuro: No dizziness or lightheadedness.  Psych: No depression or anxiety. Mood stable.     Physical Exam:  BP 138/86 (BP Location: Right Wrist, Cuff Size: Normal)   Pulse 80   Temp (!) 97.1 F (36.2 C)   Ht 6' (1.829 m)   Wt 242 lb 12.8 oz (110.1 kg)   SpO2 96%   BMI 32.93 kg/m   GEN:  Pleasant, interactive, well-appearing; obese; in no acute distress  HEENT:  Normocephalic and atraumatic. PERRLA. Sclera white. Nasal turbinates erythematous, moist and patent bilaterally. No rhinorrhea present. Oropharynx pink and moist, without exudate or edema. No lesions, ulcerations NECK:  Supple w/ fair ROM. No JVD present. Normal carotid impulses w/o bruits. Thyroid symmetrical with no goiter or nodules palpated. No lymphadenopathy.   CV: RRR, no m/r/g, no peripheral edema. Pulses intact, +2 bilaterally. No cyanosis, pallor or clubbing. PULMONARY: Unlabored, regular breathing. Clear bilaterally A&P w/o wheezes/rales/rhonchi. No accessory muscle use.  GI: BS present and normoactive. Soft, non-tender to palpation. No organomegaly or masses detected. MSK: No erythema, warmth or tenderness. Cap refil <2 sec all extrem. No deformities or joint swelling noted.  Neuro: A/Ox3. No focal deficits noted.  Skin: Warm, no lesions or rashe Psych: Normal affect and behavior. Judgement and thought content appropriate.     Lab Results:  CBC    Component Value Date/Time   WBC 8.4 12/30/2022 1554   RBC 4.87 12/30/2022 1554   HGB 13.5 12/30/2022 1554   HCT 43.0 12/30/2022 1554   PLT 258 12/30/2022 1554   MCV 88 12/30/2022 1554   MCH 27.7 12/30/2022 1554   MCHC 31.4 (L) 12/30/2022 1554   RDW 14.3 12/30/2022 1554   LYMPHSABS 2.4 12/30/2022 1554   EOSABS 0.7 (H) 12/30/2022 1554   BASOSABS 0.1 12/30/2022 1554    BMET    Component Value Date/Time   NA 140 12/30/2022 1554   K 4.3 12/30/2022 1554   CL 101 12/30/2022 1554   CO2 24 12/30/2022 1554   GLUCOSE 72 12/30/2022 1554   BUN 15 12/30/2022 1554   CREATININE 1.09 12/30/2022 1554   CALCIUM 9.1 12/30/2022 1554   GFRNONAA 78 09/27/2019 0914   GFRAA 90 09/27/2019 0914    BNP No results found for: "BNP"   Imaging:  DG Chest 2 View  Result Date: 01/26/2023 CLINICAL DATA:  SOB; cough; asthma exacerbation EXAM: CHEST - 2 VIEW  COMPARISON:  None Available. FINDINGS: No pleural effusion. No pneumothorax. Normal cardiac and mediastinal contours. No radiographically apparent displaced rib fractures. Visualized upper abdomen is unremarkable. No focal airspace opacity. Prominent bilateral perihilar opacities likely represent changes related to small airways disease. No radiographically apparent displaced rib fractures. Vertebral body heights are maintained. IMPRESSION: Prominent bilateral perihilar opacities likely represent changes related to small airways disease. No focal airspace opacity to suggest pneumonia. Electronically Signed   By: Lorenza Cambridge M.D.   On: 01/26/2023 15:28    albuterol (PROVENTIL) (2.5 MG/3ML) 0.083% nebulizer solution 2.5 mg     Date Action Dose Route User   02/02/2023 1616 Given 2.5 mg Nebulization Kearney Hard H, RT      betamethasone acetate-betamethasone sodium phosphate (CELESTONE) injection 6 mg     Date Action Dose Route User   12/30/2022 1545 Given 6 mg Intramuscular (Right Deltoid) Adella Hare B, LPN      ipratropium-albuterol (DUONEB) 0.5-2.5 (3) MG/3ML nebulizer solution 3 mL     Date Action Dose Route User   01/26/2023 1047 Given 3 mL Nebulization Rondel Baton [place], CMA      methylPREDNISolone acetate (DEPO-MEDROL) injection 120 mg     Date Action Dose Route User   01/26/2023 1049 Given 120 mg Intramuscular (Right Deltoid) Myles Lipps, CMA          Latest Ref Rng & Units 02/02/2023    4:20 PM  PFT Results  FVC-Pre L 4.39   FVC-Predicted Pre % 83   FVC-Post L 4.79   FVC-Predicted Post % 90   Pre FEV1/FVC % % 69   Post FEV1/FCV % % 80   FEV1-Pre L 3.03   FEV1-Predicted Pre % 75   FEV1-Post L 3.81   DLCO uncorrected ml/min/mmHg 32.38   DLCO UNC% % 106   DLVA Predicted % 119   TLC L 6.56   TLC % Predicted % 88   RV % Predicted % 90     No results found for: "NITRICOXIDE"      Assessment & Plan:   Severe asthma without  complication Eosinophilic asthma; allergic phenotype. Recurrent exacerbations over the last year.  PFTs with significant bronchodilator response.  Previous RAST panel positive and peripheral eosinophilia (700).  Clinically improved following steroid taper, low  dose daily prednisone, and initiation of Singulair.  He was supposed to start high-dose inhaled steroid/long-acting bronchodilator.  Unfortunately has not received this from his pharmacy.  [facility] today.  They say that they will have the [facility] in stock on Monday.  Ok to wait given clinical improvement and stability.  He will continue on low-dose daily steroids 10 mg daily until he starts the [facility] and then taper down to 5 mg daily until he is seen back in office.  If he is stable at this point, can wean him off the prednisone.  Encouraged him to continue monitoring symptoms.  Action plan in place.  He will continue Singulair and Zyrtec for trigger prevention and to help reduce airway inflammation.  If he has another exacerbation, would recommend initiating biologic therapy.  Patient Instructions  -Continue Albuterol inhaler 2 puffs or duoneb 3 mL neb every 6 hours as needed for shortness of breath or wheezing. Notify if symptoms persist despite rescue inhaler/neb use.  -Continue Zyrtec daily -Continue singulair (montelukast) 1 tab At bedtime  -Continue Budesonide nebs 2 mL Twice daily as needed for shortness of breath or wheezing. Brush tongue and rinse mouth afterwards  -Start Dulera 2 puffs Twice daily. Brush tongue and rinse mouth afterwards. If you haven't gotten this by Monday, call your pharmacy. Let me know if we need to change pharmacies at this point   -Continue prednisone 10 mg daily until you start on your [facility]. After you are on the [facility] for a week and if still feeling well, decrease prednisone to 1/2 tablet daily until I see you back. Take in AM with food   Pulmonary testing consistent with asthma diagnosis  Final read  on your high-resolution CT scan is not back yet.  We will call you with these final results.  Asthma action plan: START nebs up to four times a day for worsening shortness of breath, wheezing and cough. If you symptoms do not improve in 24-48 hours, contact us for steroid course. If your symptoms rapidly worsen, you have trouble talking or extreme difficulty breathing, or you're not getting relief from your nebulizer/rescue, go to the emergency department.   Follow up in 4-5 weeks with Dr. Aundria Rud or Philis Nettle. Ok to double book [facility] on day not already double booked. If symptoms do not improve or worsen, please contact office for sooner follow up or seek emergency care.   Allergic rhinitis Significant improvement with Zyrtec and Singulair.  Continue current regimen.    Advised if symptoms do not improve or worsen, to please contact office for sooner follow up or seek emergency care.   I spent 32 minutes of dedicated to the care of this patient on the date of this encounter to include pre-visit review of records, face-to-face time with the patient discussing conditions above, post visit ordering of testing, clinical documentation with the electronic health record, making appropriate referrals as documented, and communicating necessary findings to members of the patients care team.  Noemi Chapel, NP 02/05/2023  Pt aware and understands NP's role.

## 2023-02-05 NOTE — Assessment & Plan Note (Signed)
Eosinophilic asthma; allergic phenotype. Recurrent exacerbations over the last year.  PFTs with significant bronchodilator response.  Previous RAST panel positive and peripheral eosinophilia (700).  Clinically improved following steroid taper, low dose daily prednisone, and initiation of Singulair.  He was supposed to start high-dose inhaled steroid/long-acting bronchodilator.  Unfortunately has not received this from his pharmacy.  [facility]  They say that they will have the [facility] in stock on Monday.  Ok to wait given clinical improvement and stability.  He will continue on low-dose daily steroids 10 mg daily until he starts the [facility] and then taper down to 5 mg daily until he is seen back in office.  If he is stable at this point, can wean him off the prednisone.  Encouraged him to continue monitoring symptoms.  Action plan in place.  He will continue Singulair and Zyrtec for trigger prevention and to help reduce airway inflammation.  If he has another exacerbation, would recommend initiating biologic therapy.  Patient Instructions  -Continue Albuterol inhaler 2 puffs or duoneb 3 mL neb every 6 hours as needed for shortness of breath or wheezing. Notify if symptoms persist despite rescue inhaler/neb use.  -Continue Zyrtec daily -Continue singulair (montelukast) 1 tab At bedtime  -Continue Budesonide nebs 2 mL Twice daily as needed for shortness of breath or wheezing. Brush tongue and rinse mouth afterwards  -Start Dulera 2 puffs Twice daily. Brush tongue and rinse mouth afterwards. If you haven't gotten this by Monday, call your pharmacy. Let me know if we need to change pharmacies at this point   -Continue prednisone 10 mg daily until you start on your [facility]. After you are on the [facility] for a week and if still feeling well, decrease prednisone to 1/2 tablet daily until I see you back. Take in AM with food   Pulmonary testing consistent with asthma diagnosis  Final read on your  high-resolution CT scan is not back yet.  We will call you with these final results.  Asthma action plan: START nebs up to four times a day for worsening shortness of breath, wheezing and cough. If you symptoms do not improve in 24-48 hours, contact us for steroid course. If your symptoms rapidly worsen, you have trouble talking or extreme difficulty breathing, or you're not getting relief from your nebulizer/rescue, go to the emergency department.   Follow up in 4-5 weeks with Dr. Aundria Rud or Philis Nettle. Ok to double book [facility] on day not already double booked. If symptoms do not improve or worsen, please contact office for sooner follow up or seek emergency care.

## 2023-02-16 ENCOUNTER — Other Ambulatory Visit: Payer: Self-pay | Admitting: Nurse Practitioner

## 2023-02-16 DIAGNOSIS — J4551 Severe persistent asthma with (acute) exacerbation: Secondary | ICD-10-CM

## 2023-03-05 ENCOUNTER — Ambulatory Visit: Payer: 59 | Admitting: Student in an Organized Health Care Education/Training Program

## 2023-04-01 ENCOUNTER — Encounter: Payer: Self-pay | Admitting: Family Medicine

## 2023-04-01 ENCOUNTER — Ambulatory Visit (INDEPENDENT_AMBULATORY_CARE_PROVIDER_SITE_OTHER): Payer: 59

## 2023-04-01 ENCOUNTER — Ambulatory Visit (INDEPENDENT_AMBULATORY_CARE_PROVIDER_SITE_OTHER): Payer: 59 | Admitting: Family Medicine

## 2023-04-01 VITALS — BP 108/66 | HR 92 | Temp 98.2°F | Ht 72.0 in | Wt 241.0 lb

## 2023-04-01 DIAGNOSIS — R1012 Left upper quadrant pain: Secondary | ICD-10-CM

## 2023-04-01 NOTE — Progress Notes (Signed)
Subjective:  Patient ID: Ryan Rojas, male    DOB: 01/19/1966, 58 y.o.   MRN: [id number]  Patient Care Team: Mechele Claude, MD as PCP - General (Family Medicine)   Chief Complaint:  Abdominal Pain (X 6 days)   HPI: Ryan Rojas is a 58 y.o. male presenting on 04/01/2023 for Abdominal Pain (X 6 days) States that stomach pain started 6 days ago (Friday). He reports a decreased appetite over the weekend. States that the pain worsened on Monday and is getting somewhat better, but has not resolved. Points to left chest below nipple line where pain is present. States that it comes and goes throughout the day. Describes it as dull pain. Reports that he is having slower BM's. They are every other day when he was previously going daily. Denies straining, hard stools. He tried a "colon cleanse" pill, Antacid, alka seltzer, and Tums. States that it is somewhat better. Reports that holding the site/putting pressure on it makes it better. He does not have trouble swallowing, acid reflux, excessive burping, or water brash. States that he does still have gas.   Relevant past medical, surgical, family, and social history reviewed and updated as indicated.  Allergies and medications reviewed and updated. Data reviewed: Chart in Epic.   Past Medical History:  Diagnosis Date   Back pain    Community acquired pneumonia 09/07/2022   Hyperlipidemia    Risk factors for obstructive sleep apnea 11/05/2021   S/P left rotator cuff repair 12/09/2021    Past Surgical History:  Procedure Laterality Date   COLONOSCOPY WITH PROPOFOL N/A 02/24/2022   Procedure: COLONOSCOPY WITH PROPOFOL;  Surgeon: Lanelle Bal, DO;  Location: [facility];  Service: Endoscopy;  Laterality: N/A;  10:30 AM, moved to 12/12 at 12:30   ROTATOR CUFF REPAIR Left 07/10/2021   November he had the Right one done    Social History   Socioeconomic History   Marital status: Single    Spouse name: Not on file   Number of  children: 3   Years of education: Not on file   Highest education level: High school graduate  Occupational History   Not on file  Tobacco Use   Smoking status: Never   Smokeless tobacco: Never  Vaping Use   Vaping status: Never Used  Substance and Sexual Activity   Alcohol use: Yes    Alcohol/week: 1.0 standard drink of alcohol    Types: 1 Cans of beer per week   Drug use: No   Sexual activity: Yes    Birth control/protection: None  Other Topics Concern   Not on file  Social History Narrative   Lives alone   L handed   Caffeine: 1 C of coffee every AM   Social Drivers of Corporate investment banker Strain: Not on file  Food Insecurity: Not on file  Transportation Needs: Not on file  Physical Activity: Not on file  Stress: Not on file  Social Connections: Not on file  Intimate Partner Violence: Not on file    Outpatient Encounter Medications as of 04/01/2023  Medication Sig   albuterol (VENTOLIN HFA) 108 (90 Base) MCG/ACT inhaler Inhale 2 puffs into the lungs every 4 (four) hours as needed for shortness of breath or wheezing.   atorvastatin (LIPITOR) 40 MG tablet Take 1 tablet (40 mg total) by mouth daily.   budesonide (PULMICORT) 0.5 MG/2ML nebulizer solution Take 2 mLs (0.5 mg total) by nebulization in the morning and at bedtime.  cetirizine (ZYRTEC) 10 MG tablet Take 10 mg by mouth daily.   imipramine (TOFRANIL) 25 MG tablet TAKE 1 TABLET BY MOUTH EVERYDAY AT BEDTIME   ipratropium-albuterol (DUONEB) 0.5-2.5 (3) MG/3ML SOLN Take 3 mLs by nebulization every 6 (six) hours as needed (shortness of breath, wheezing, cough).   levalbuterol (XOPENEX HFA) 45 MCG/ACT inhaler Inhale 2 puffs into the lungs every 4 (four) hours as needed for wheezing.   mometasone-formoterol (DULERA) 200-5 MCG/ACT AERO Inhale 2 puffs into the lungs in the morning and at bedtime.   Spacer/Aero-Holding Chambers (AEROCHAMBER MV) inhaler Use as instructed   [DISCONTINUED] benzonatate (TESSALON PERLES)  100 MG capsule Take 1 capsule (100 mg total) by mouth 3 (three) times daily as needed for cough.   montelukast (SINGULAIR) 10 MG tablet Take 1 tablet (10 mg total) by mouth at bedtime. (Patient not taking: Reported on 04/01/2023)   moxifloxacin (AVELOX) 400 MG tablet Take 1 tablet (400 mg total) by mouth daily. (Patient not taking: Reported on 04/01/2023)   phentermine 37.5 MG capsule Take 1 capsule (37.5 mg total) by mouth every morning. (Patient not taking: Reported on 04/01/2023)   predniSONE (DELTASONE) 10 MG tablet 4 tabs for 3 days, then 3 tabs for 3 days, 2 tabs for 3 days, then stay on 1 tab daily (Patient not taking: Reported on 04/01/2023)   predniSONE (DELTASONE) 10 MG tablet TAKE 1 TABLET (10 MG TOTAL) BY MOUTH DAILY WITH BREAKFAST. (Patient not taking: Reported on 04/01/2023)   promethazine-dextromethorphan (PROMETHAZINE-DM) 6.25-15 MG/5ML syrup Take 5 mLs by mouth 4 (four) times daily as needed for cough. (Patient not taking: Reported on 04/01/2023)   sildenafil (VIAGRA) 100 MG tablet TAKE 1/2 - 1 TABLET BY MOUTH DAILY AS NEEDED FOR ERECTILE DYSFUNCTION (Patient not taking: Reported on 04/01/2023)   No facility-administered encounter medications on file as of 04/01/2023.    Allergies  Allergen Reactions   Guaifenesin-Codeine Itching   Hydrocodone Itching   Oxycodone Hives    Review of Systems As per HPI  Objective:  BP 108/66   Pulse 92   Temp 98.2 F (36.8 C)   Ht 6' (1.829 m)   Wt 241 lb (109.3 kg)   SpO2 98%   BMI 32.69 kg/m    Wt Readings from Last 3 Encounters:  04/01/23 241 lb (109.3 kg)  02/05/23 242 lb 12.8 oz (110.1 kg)  01/26/23 246 lb 9.6 oz (111.9 kg)    Physical Exam Constitutional:      General: He is awake. He is not in acute distress.    Appearance: Normal appearance. He is well-developed and well-groomed. He is not ill-appearing, toxic-appearing or diaphoretic.  Cardiovascular:     Rate and Rhythm: Normal rate and regular rhythm.     Pulses: Normal  pulses.          Radial pulses are 2+ on the right side and 2+ on the left side.       Posterior tibial pulses are 2+ on the right side and 2+ on the left side.     Heart sounds: Normal heart sounds. No murmur heard.    No gallop.  Pulmonary:     Effort: Pulmonary effort is normal. No respiratory distress.     Breath sounds: Normal breath sounds. No stridor. No wheezing, rhonchi or rales.  Abdominal:     General: Abdomen is flat. Bowel sounds are normal. There is distension.     Palpations: Abdomen is soft. There is no shifting dullness.     Tenderness:  There is no abdominal tenderness.     Hernia: No hernia is present.  Musculoskeletal:     Cervical back: Full passive range of motion without pain and neck supple.     Right lower leg: No edema.     Left lower leg: No edema.  Skin:    General: Skin is warm.     Capillary Refill: Capillary refill takes less than 2 seconds.  Neurological:     General: No focal deficit present.     Mental Status: He is alert, oriented to person, place, and time and easily aroused. Mental status is at baseline.     GCS: GCS eye subscore is 4. GCS verbal subscore is 5. GCS motor subscore is 6.     Motor: No weakness.  Psychiatric:        Attention and Perception: Attention and perception normal.        Mood and Affect: Mood and affect normal.        Speech: Speech normal.        Behavior: Behavior normal. Behavior is cooperative.        Thought Content: Thought content normal. Thought content does not include homicidal or suicidal ideation. Thought content does not include homicidal or suicidal plan.        Cognition and Memory: Cognition and memory normal.        Judgment: Judgment normal.     Results for orders placed or performed in visit on 02/02/23  Pulmonary Function Test [facility] Only   Collection Time: 02/02/23  4:20 PM  Result Value Ref Range   FVC-%Pred-Pre 83 %   FVC-Post 4.79 L   FVC-%Pred-Post 90 %   FVC-%Change-Post 9 %   FEV1-Pre 3.03  L   FEV1-%Pred-Pre 75 %   FEV1-Post 3.81 L   FEV1-%Pred-Post 94 %   FEV1-%Change-Post 25 %   FEV6-Pre 4.32 L   FEV6-%Pred-Pre 85 %   FEV6-Post 4.74 L   FEV6-%Pred-Post 93 %   FEV6-%Change-Post 9 %   Pre FEV1/FVC ratio 69 %   FEV1FVC-%Pred-Pre 89 %   Post FEV1/FVC ratio 80 %   FEV1FVC-%Change-Post 15 %   Pre FEV6/FVC Ratio 98 %   FEV6FVC-%Pred-Pre 102 %   Post FEV6/FVC ratio 99 %   FEV6FVC-%Pred-Post 103 %   FEV6FVC-%Change-Post 0 %   FEF 25-75 Pre 1.85 L/sec   FEF2575-%Pred-Pre 54 %   FEF 25-75 Post 4.09 L/sec   FEF2575-%Pred-Post 119 %   FEF2575-%Change-Post 121 %   RV 2.03 L   RV % pred 90 %   TLC 6.56 L   TLC % pred 88 %   DLCO unc 32.38 ml/min/mmHg   DLCO unc % pred 106 %   DL/VA 1.61 ml/min/mmHg/L   DL/VA % pred 096 %   FVC-Pre 4.39 L       04/01/2023    9:47 AM 12/30/2022    3:12 PM 11/25/2022    1:14 PM 11/10/2022    9:46 AM 11/02/2022    9:15 AM  Depression screen PHQ 2/9  Decreased Interest 0 0 0 0 0  Down, Depressed, Hopeless 0 0 0 0 0  PHQ - 2 Score 0 0 0 0 0  Altered sleeping 0    0  Tired, decreased energy 0    0  Change in appetite 0    0  Feeling bad or failure about yourself  0    0  Trouble concentrating 0    0  Moving slowly or  fidgety/restless 0    0  Suicidal thoughts 0    0  PHQ-9 Score 0    0  Difficult doing work/chores Not difficult at all    Not difficult at all       04/01/2023    9:47 AM 11/02/2022    9:15 AM 09/07/2022    9:23 AM 03/26/2022    2:15 PM  GAD 7 : Generalized Anxiety Score  Nervous, Anxious, on Edge 0 0 0 0  Control/stop worrying 0 0 0 0  Worry too much - different things 0 0 0 0  Trouble relaxing 0 0 0 2  Restless 0 0 0 1  Easily annoyed or irritable 0 0 0 0  Afraid - awful might happen 0 0 0 0  Total GAD 7 Score 0 0 0 3  Anxiety Difficulty Not difficult at all Not difficult at all Not difficult at all Somewhat difficult   Pertinent labs & imaging results that were available during my care of the patient were  reviewed by me and considered in my medical decision making.  Assessment & Plan:  Ryan Rojas was seen today for abdominal pain.  Diagnoses and all orders for this visit:  Left upper quadrant abdominal pain Reviewed EKG in office. No significant abnormalities.  Symptoms consistent with constipation. Provided information on bowel cleanout. Encouraged patient to start simethicone as well. Discussed red flag symptoms.  -     EKG 12-Lead -     DG Abd 1 View  Continue all other maintenance medications.  Follow up plan: Return if symptoms worsen or fail to improve.   Continue healthy lifestyle choices, including diet (rich in fruits, vegetables, and lean proteins, and low in salt and simple carbohydrates) and exercise (at least 30 minutes of moderate physical activity daily).  Written and verbal instructions provided   The above assessment and management plan was discussed with the patient. The patient verbalized understanding of and has agreed to the management plan. Patient is aware to call the clinic if they develop any new symptoms or if symptoms persist or worsen. Patient is aware when to return to the clinic for a follow-up visit. Patient educated on when it is appropriate to go to the emergency department.   Neale Burly, DNP-FNP [facility] [address] [place], [place] [address] [phone]

## 2023-04-01 NOTE — Patient Instructions (Signed)
Thank you for coming in to clinic today.  1. Your symptoms are consistent with Constipation, likely cause of your General Abdominal Pain / Cramping. 2. Start with Miralax. First dose 68g (4 capfuls) in 32oz water over 1 to 2 hours for clean out. Next day start 17g or 1 capful daily, may adjust dose up or down by half a capful every few days. Recommend to take this medicine daily for next 1-2 weeks, you may need to use it longer if needed. - Goal is to have soft regular bowel movement 1-3x daily, if too runny or diarrhea, then reduce dose of the medicine to every other day.  Improve water intake, hydration will help Also recommend increased vegetables, fruits, fiber intake Can try daily Metamucil or Fiber supplement at pharmacy over the counter  Follow-up if symptoms are not improving with bowel movements, or if pain worsens, develop fevers, nausea, vomiting.  Please schedule a follow-up appointment in 1 month to follow-up Constipation  If you have any other questions or concerns, please feel free to call the clinic to contact me. You may also schedule an earlier appointment if necessary.  However, if your symptoms get significantly worse, please go to the Emergency Department to seek immediate medical attention.  

## 2023-04-01 NOTE — Progress Notes (Signed)
Reviewed with patient in office;

## 2023-04-05 ENCOUNTER — Encounter: Payer: Self-pay | Admitting: Student in an Organized Health Care Education/Training Program

## 2023-04-05 ENCOUNTER — Ambulatory Visit: Payer: 59 | Admitting: Student in an Organized Health Care Education/Training Program

## 2023-04-05 VITALS — BP 128/86 | HR 90 | Temp 97.6°F | Ht 72.0 in | Wt 240.8 lb

## 2023-04-05 DIAGNOSIS — J4550 Severe persistent asthma, uncomplicated: Secondary | ICD-10-CM | POA: Diagnosis not present

## 2023-04-05 NOTE — Progress Notes (Signed)
Assessment & Plan:   #Moderate Persistent Asthma  Presents for follow up of shortness of breath and asthma, with previous history of episodic wheeze and cough responsive to steroids. His workup has showing eosinophilia (up to 700) with an allergen panel that showed multiple allergies (dust mites, cats, dogs, pollen). He was started on ICS/LABA with some improvement, but symptoms recurred and he required a course of prednisone with significant improvement. He's been unable to perform a FENO (last visit and today). Workup also included a high resolution chest CT (negative for ILD/fibrosis) and PFT's (reversible obstruction consistent with asthma).  He is currently much improved and is using Dulera as needed. Discussed that this is a reasonable approach, and encouraged to resume taking ICS/LABA two puffs twice daily regularly if symptoms show any signs of recurrence.  -continue Dulera, instructed to increase frequency to two puffs bid if symptoms recurr -d/c prednisone   Return in about 6 months (around 10/03/2023).  I spent 30 minutes caring for this patient today, including preparing to see the patient, obtaining a medical history , reviewing a separately obtained history, performing a medically appropriate examination and/or evaluation, counseling and educating the patient/family/caregiver, ordering medications, tests, or procedures, documenting clinical information in the electronic health record, and independently interpreting results (not separately reported/billed) and communicating results to the patient/family/caregiver  Raechel Chute, MD [facility] 04/05/2023 12:49 PM    End of visit medications:  No orders of the defined types were placed in this encounter.    Current Outpatient Medications:    albuterol (VENTOLIN HFA) 108 (90 Base) MCG/ACT inhaler, Inhale 2 puffs into the lungs every 4 (four) hours as needed for shortness of breath or wheezing., Disp: 8  g, Rfl: 2   atorvastatin (LIPITOR) 40 MG tablet, Take 1 tablet (40 mg total) by mouth daily., Disp: 90 tablet, Rfl: 3   budesonide (PULMICORT) 0.5 MG/2ML nebulizer solution, Take 2 mLs (0.5 mg total) by nebulization in the morning and at bedtime. (Patient taking differently: Take 0.5 mg by nebulization as needed.), Disp: 120 mL, Rfl: 11   cetirizine (ZYRTEC) 10 MG tablet, Take 10 mg by mouth as needed., Disp: , Rfl:    imipramine (TOFRANIL) 25 MG tablet, TAKE 1 TABLET BY MOUTH EVERYDAY AT BEDTIME, Disp: 90 tablet, Rfl: 1   ipratropium-albuterol (DUONEB) 0.5-2.5 (3) MG/3ML SOLN, Take 3 mLs by nebulization every 6 (six) hours as needed (shortness of breath, wheezing, cough)., Disp: 360 mL, Rfl: 2   levalbuterol (XOPENEX HFA) 45 MCG/ACT inhaler, Inhale 2 puffs into the lungs every 4 (four) hours as needed for wheezing., Disp: 1 each, Rfl: 12   mometasone-formoterol (DULERA) 200-5 MCG/ACT AERO, Inhale 2 puffs into the lungs in the morning and at bedtime., Disp: 39 g, Rfl: 12   montelukast (SINGULAIR) 10 MG tablet, Take 1 tablet (10 mg total) by mouth at bedtime., Disp: 30 tablet, Rfl: 5   moxifloxacin (AVELOX) 400 MG tablet, Take 1 tablet (400 mg total) by mouth daily., Disp: 10 tablet, Rfl: 0   sildenafil (VIAGRA) 100 MG tablet, TAKE 1/2 - 1 TABLET BY MOUTH DAILY AS NEEDED FOR ERECTILE DYSFUNCTION, Disp: 6 tablet, Rfl: 11   Spacer/Aero-Holding Chambers (AEROCHAMBER MV) inhaler, Use as instructed, Disp: 1 each, Rfl: 0   phentermine 37.5 MG capsule, Take 1 capsule (37.5 mg total) by mouth every morning. (Patient not taking: Reported on 04/05/2023), Disp: 30 capsule, Rfl: 2   Subjective:   PATIENT ID: Ryan Rojas GENDER: male DOB: October 14, 1965, MRN:  [id number]  Chief Complaint  Patient presents with   Follow-up    HPI  Patient is a pleasant 58 year old male presenting for follow up on Asthma.  Since our last visit, he's been seen twice in our clinic for worsening symptoms by [name], NP. His  initial PFT's were consistent with asthma, and his allergen testing and CBC were consistent with th-2 type asthma. He was started on ICS/LABA (now dulera). He did receive a course of prednisone in November which helped with his symptoms. Today, he reports being fully back to normal. No shortness of breath, no cough, no wheeze reported. He stopped taking prednisone. He is not taking Singulair. He is using the [facility] as needed, and hasn't had to use it but a handful of times over the past few months.   Patient is reporting symptoms that started around January of 2024. He's experienced multiple episodes of cough, shortness of breath, and wheeze requiring medical attention. The symptoms usually improve with prednisone, antibiotics, and albuterol, but have always recurred. He has not had sustained relief which has resulted in significant frustration. He reports no fevers, no chills, no night sweats, and no fevers. He's not had any signs or symptoms of systemic illness. His symptoms start with a cough that is productive of sputum, with associated wheeze.   Patient denies any work exposures, reviewed routine at length. 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 All work is done under a hood, does not do any welding or grinding, no exposure to fumes, rarely on the factory floor. He lives at home with his wife, moved to this house 10 years ago, without any changes. No water leaks or damage, no mold noted, and no carpet in the house. Does not have any pets. He's always worked for [employer]. Does use febreeze at home for smell as well as perfumes.Denies any smoking history. He was born in [place], lived in [place] briefly, and spent most of his formative years in [place]. Moved to [place] 10 years ago.  Ancillary information including prior medications, full medical/surgical/family/social histories, and PFTs (when available) are listed below and  have been reviewed.   Review of Systems  Constitutional:  Negative for chills, fever and weight loss.  Respiratory:  Negative for cough, hemoptysis, sputum production, shortness of breath and wheezing.   Cardiovascular:  Negative for chest pain.     Objective:   Vitals:   04/05/23 0841  BP: 128/86  Pulse: 90  Temp: 97.6 F (36.4 C)  TempSrc: Temporal  SpO2: 98%  Weight: 240 lb 12.8 oz (109.2 kg)  Height: 6' (1.829 m)   98% on RA BMI Readings from Last 3 Encounters:  04/05/23 32.66 kg/m  04/01/23 32.69 kg/m  02/05/23 32.93 kg/m   Wt Readings from Last 3 Encounters:  04/05/23 240 lb 12.8 oz (109.2 kg)  04/01/23 241 lb (109.3 kg)  02/05/23 242 lb 12.8 oz (110.1 kg)    Physical Exam Constitutional:      Appearance: Normal appearance. He is not ill-appearing.  Cardiovascular:     Rate and Rhythm: Normal rate and regular rhythm.     Pulses: Normal pulses.     Heart sounds: Normal heart sounds.  Pulmonary:     Breath sounds: No wheezing, rhonchi or rales.  Abdominal:     Palpations: Abdomen is soft.  Neurological:     General: No focal deficit present.     Mental Status:  He is alert and oriented to person, place, and time. Mental status is at baseline.       Ancillary Information    Past Medical History:  Diagnosis Date   Back pain    Community acquired pneumonia 09/07/2022   Hyperlipidemia    Risk factors for obstructive sleep apnea 11/05/2021   S/P left rotator cuff repair 12/09/2021     Family History  Problem Relation Age of Onset   Asthma Mother    Diabetes Sister    Asthma Sister      Past Surgical History:  Procedure Laterality Date   COLONOSCOPY WITH PROPOFOL N/A 02/24/2022   Procedure: COLONOSCOPY WITH PROPOFOL;  Surgeon: Lanelle Bal, DO;  Location: [facility];  Service: Endoscopy;  Laterality: N/A;  10:30 AM, moved to 12/12 at 12:30   ROTATOR CUFF REPAIR Left 07/10/2021   November he had the Right one done    Social  History   Socioeconomic History   Marital status: Single    Spouse name: Not on file   Number of children: 3   Years of education: Not on file   Highest education level: High school graduate  Occupational History   Not on file  Tobacco Use   Smoking status: Never   Smokeless tobacco: Never  Vaping Use   Vaping status: Never Used  Substance and Sexual Activity   Alcohol use: Yes    Alcohol/week: 1.0 standard drink of alcohol    Types: 1 Cans of beer per week   Drug use: No   Sexual activity: Yes    Birth control/protection: None  Other Topics Concern   Not on file  Social History Narrative   Lives alone   L handed   Caffeine: 1 C of coffee every AM   Social Drivers of Health   Financial Resource Strain: Not on file  Food Insecurity: Not on file  Transportation Needs: Not on file  Physical Activity: Not on file  Stress: Not on file  Social Connections: Not on file  Intimate Partner Violence: Not on file     Allergies  Allergen Reactions   Guaifenesin-Codeine Itching   Hydrocodone Itching   Oxycodone Hives     CBC    Component Value Date/Time   WBC 8.4 12/30/2022 1554   RBC 4.87 12/30/2022 1554   HGB 13.5 12/30/2022 1554   HCT 43.0 12/30/2022 1554   PLT 258 12/30/2022 1554   MCV 88 12/30/2022 1554   MCH 27.7 12/30/2022 1554   MCHC 31.4 (L) 12/30/2022 1554   RDW 14.3 12/30/2022 1554   LYMPHSABS 2.4 12/30/2022 1554   EOSABS 0.7 (H) 12/30/2022 1554   BASOSABS 0.1 12/30/2022 1554    Pulmonary Functions Testing Results:    Latest Ref Rng & Units 02/02/2023    4:20 PM  PFT Results  FVC-Pre L 4.39   FVC-Predicted Pre % 83   FVC-Post L 4.79   FVC-Predicted Post % 90   Pre FEV1/FVC % % 69   Post FEV1/FCV % % 80   FEV1-Pre L 3.03   FEV1-Predicted Pre % 75   FEV1-Post L 3.81   DLCO uncorrected ml/min/mmHg 32.38   DLCO UNC% % 106   DLVA Predicted % 119   TLC L 6.56   TLC % Predicted % 88   RV % Predicted % 90     Outpatient Medications Prior to  Visit  Medication Sig Dispense Refill   albuterol (VENTOLIN HFA) 108 (90 Base) MCG/ACT inhaler Inhale 2  puffs into the lungs every 4 (four) hours as needed for shortness of breath or wheezing. 8 g 2   atorvastatin (LIPITOR) 40 MG tablet Take 1 tablet (40 mg total) by mouth daily. 90 tablet 3   budesonide (PULMICORT) 0.5 MG/2ML nebulizer solution Take 2 mLs (0.5 mg total) by nebulization in the morning and at bedtime. (Patient taking differently: Take 0.5 mg by nebulization as needed.) 120 mL 11   cetirizine (ZYRTEC) 10 MG tablet Take 10 mg by mouth as needed.     imipramine (TOFRANIL) 25 MG tablet TAKE 1 TABLET BY MOUTH EVERYDAY AT BEDTIME 90 tablet 1   ipratropium-albuterol (DUONEB) 0.5-2.5 (3) MG/3ML SOLN Take 3 mLs by nebulization every 6 (six) hours as needed (shortness of breath, wheezing, cough). 360 mL 2   levalbuterol (XOPENEX HFA) 45 MCG/ACT inhaler Inhale 2 puffs into the lungs every 4 (four) hours as needed for wheezing. 1 each 12   mometasone-formoterol (DULERA) 200-5 MCG/ACT AERO Inhale 2 puffs into the lungs in the morning and at bedtime. 39 g 12   montelukast (SINGULAIR) 10 MG tablet Take 1 tablet (10 mg total) by mouth at bedtime. 30 tablet 5   moxifloxacin (AVELOX) 400 MG tablet Take 1 tablet (400 mg total) by mouth daily. 10 tablet 0   sildenafil (VIAGRA) 100 MG tablet TAKE 1/2 - 1 TABLET BY MOUTH DAILY AS NEEDED FOR ERECTILE DYSFUNCTION 6 tablet 11   Spacer/Aero-Holding Chambers (AEROCHAMBER MV) inhaler Use as instructed 1 each 0   phentermine 37.5 MG capsule Take 1 capsule (37.5 mg total) by mouth every morning. (Patient not taking: Reported on 04/05/2023) 30 capsule 2   predniSONE (DELTASONE) 10 MG tablet 4 tabs for 3 days, then 3 tabs for 3 days, 2 tabs for 3 days, then stay on 1 tab daily (Patient not taking: Reported on 04/01/2023) 30 tablet 0   predniSONE (DELTASONE) 10 MG tablet TAKE 1 TABLET (10 MG TOTAL) BY MOUTH DAILY WITH BREAKFAST. (Patient not taking: Reported on  04/01/2023) 30 tablet 0   promethazine-dextromethorphan (PROMETHAZINE-DM) 6.25-15 MG/5ML syrup Take 5 mLs by mouth 4 (four) times daily as needed for cough. (Patient not taking: Reported on 04/01/2023) 240 mL 0   No facility-administered medications prior to visit.

## 2023-04-09 ENCOUNTER — Encounter: Payer: Self-pay | Admitting: Family Medicine

## 2023-04-09 NOTE — Progress Notes (Signed)
Normal abdominal xray. Please follow up if symptoms continue

## 2023-05-25 ENCOUNTER — Telehealth: Admitting: Physician Assistant

## 2023-05-25 DIAGNOSIS — J069 Acute upper respiratory infection, unspecified: Secondary | ICD-10-CM

## 2023-05-25 MED ORDER — BENZONATATE 100 MG PO CAPS: 100.0000 mg | ORAL_CAPSULE | Freq: Three times a day (TID) | ORAL | 0 refills | Status: DC | PRN

## 2023-05-25 MED ORDER — FLUTICASONE PROPIONATE 50 MCG/ACT NA SUSP: 2.0000 | Freq: Every day | NASAL | 0 refills | Status: DC

## 2023-05-25 NOTE — Progress Notes (Signed)
 E visit for Flu like symptoms   We are sorry that you are not feeling well.  Here is how we plan to help! Based on what you have shared with me it looks like you may have flu-like symptoms that should be watched but do not seem to indicate anti-viral treatment.  Influenza or "the flu" is   an infection caused by a respiratory virus. The flu virus is highly contagious and persons who did not receive their yearly flu vaccination may "catch" the flu from close contact.  We have anti-viral medications to treat the viruses that cause this infection. They are not a "cure" and only shorten the course of the infection. These prescriptions are most effective when they are given within the first 2 days of "flu" symptoms. Antiviral medication are indicated if you have a high risk of complications from the flu. You should  also consider an antiviral medication if you are in close contact with someone who is at risk. These medications can help patients avoid complications from the flu  but have side effects that you should know. Possible side effects from Tamiflu or oseltamivir include nausea, vomiting, diarrhea, dizziness, headaches, eye redness, sleep problems or other respiratory symptoms. You should not take Tamiflu if you have an allergy to oseltamivir or any to the ingredients in Tamiflu.  Based upon your symptoms and potential risk factors I recommend that you follow the flu symptoms recommendation that I have listed below.  This is an infection that is most likely caused by a virus. There are no specific treatments other than to help you with the symptoms until the infection runs its course.  We are sorry you are not feeling well.  Here is how we plan to help!  For nasal congestion, you may use an oral decongestants such as Mucinex D or if you have glaucoma or high blood pressure use plain Mucinex.  Saline nasal spray or nasal drops can help and can safely be used as often as needed for congestion.  For  your congestion, I have prescribed Fluticasone nasal spray one spray in each nostril twice a day  If you do not have a history of heart disease, hypertension, diabetes or thyroid disease, prostate/bladder issues or glaucoma, you may also use Sudafed to treat nasal congestion.  It is highly recommended that you consult with a pharmacist or your primary care physician to ensure this medication is safe for you to take.     If you have a cough, you may use cough suppressants such as Delsym and Robitussin.  If you have glaucoma or high blood pressure, you can also use Coricidin HBP.   For cough I have prescribed for you A prescription cough medication called Tessalon Perles 100 mg. You may take 1-2 capsules every 8 hours as needed for cough  If you have a sore or scratchy throat, use a saltwater gargle-  to  teaspoon of salt dissolved in a 4-ounce to 8-ounce glass of warm water.  Gargle the solution for approximately 15-30 seconds and then spit.  It is important not to swallow the solution.  You can also use throat lozenges/cough drops and Chloraseptic spray to help with throat pain or discomfort.  Warm or cold liquids can also be helpful in relieving throat pain.  For headache, pain or general discomfort, you can use Ibuprofen or Tylenol as directed.   Some authorities believe that zinc sprays or the use of Echinacea may shorten the course of your symptoms.  HOME CARE Only take medications as instructed by your medical team. Be sure to drink plenty of fluids. Water is fine as well as fruit juices, sodas and electrolyte beverages. You may want to stay away from caffeine or alcohol. If you are nauseated, try taking small sips of liquids. How do you know if you are getting enough fluid? Your urine should be a pale yellow or almost colorless. Get rest. Taking a steamy shower or using a humidifier may help nasal congestion and ease sore throat pain. You can place a towel over your head and breathe in the  steam from hot water coming from a faucet. Using a saline nasal spray works much the same way. Cough drops, hard candies and sore throat lozenges may ease your cough. Avoid close contacts especially the very young and the elderly Cover your mouth if you cough or sneeze Always remember to wash your hands.   ANYONE WHO HAS FLU SYMPTOMS SHOULD: Stay home. The flu is highly contagious and going out or to work exposes others! Be sure to drink plenty of fluids. Water is fine as well as fruit juices, sodas and electrolyte beverages. You may want to stay away from caffeine or alcohol. If you are nauseated, try taking small sips of liquids. How do you know if you are getting enough fluid? Your urine should be a pale yellow or almost colorless. Get rest. Taking a steamy shower or using a humidifier may help nasal congestion and ease sore throat pain. Using a saline nasal spray works much the same way. Cough drops, hard candies and sore throat lozenges may ease your cough. Line up a caregiver. Have someone check on you regularly.   GET HELP RIGHT AWAY IF: You cannot keep down liquids or your medications. You become short of breath Your fell like you are going to pass out or loose consciousness. Your symptoms persist after you have completed your treatment plan MAKE SURE YOU  Understand these instructions. Will watch your condition. Will get help right away if you are not doing well or get worse.  Your e-visit answers were reviewed by a board certified advanced clinical practitioner to complete your personal care plan.  Depending on the condition, your plan could have included both over the counter or prescription medications.  If there is a problem please reply  once you have received a response from your provider.  Your safety is important to [place].  If you have drug allergies check your prescription carefully.    You can use MyChart to ask questions about today's visit, request a non-urgent call  back, or ask for a work or school excuse for 24 hours related to this e-Visit. If it has been greater than 24 hours you will need to follow up with your provider, or enter a new e-Visit to address those concerns.  You will get an e-mail in the next two days asking about your experience.  I hope that your e-visit has been valuable and will speed your recovery. Thank you for using e-visits.   I have spent 5 minutes in review of e-visit questionnaire, review and updating patient chart, medical decision making and response to patient.   Margaretann Loveless, PA-C

## 2023-07-05 ENCOUNTER — Other Ambulatory Visit: Payer: Self-pay | Admitting: Family Medicine

## 2023-07-05 DIAGNOSIS — J4551 Severe persistent asthma with (acute) exacerbation: Secondary | ICD-10-CM

## 2023-07-30 ENCOUNTER — Ambulatory Visit: Admitting: Family

## 2023-07-30 ENCOUNTER — Ambulatory Visit: Payer: Self-pay

## 2023-07-30 NOTE — Telephone Encounter (Signed)
 Copied from CRM [id number]. Topic: Clinical - Red Word Triage >> Jul 30, 2023  8:26 AM Elle L wrote: Red Word that prompted transfer to Nurse Triage: The patient states he hurt something and has pain in his wrist and his arm.  Chief Complaint: right arm and wrist pain Symptoms: pain Frequency: for over a week Pertinent Negatives: Patient denies fever, cp, sob Disposition: [] ED /[] Urgent Care (no appt availability in office) / [x] Appointment(In office/virtual)/ []  [facility]/ [] Home Care/ [] Refused Recommended Disposition /[] [place] [facility]/ []  Follow-up with PCP Additional Notes: apt made per protocol; care advice given, denies questions; instructed to go to ER if becomes worse.   Reason for Disposition  [1] MODERATE pain (e.g., interferes with normal activities) AND [2] present > 3 days  Answer Assessment - Initial Assessment Questions 1. ONSET: "When did the pain start?"     At work, last week 2. LOCATION: "Where is the pain located?"     Right arm and wrist 3. PAIN: "How bad is the pain?" (Scale 1-10; or mild, moderate, severe)   - MILD (1-3): Doesn't interfere with normal activities.   - MODERATE (4-7): Interferes with normal activities (e.g., work or school) or awakens from sleep.   - SEVERE (8-10): Excruciating pain, unable to do any normal activities, unable to hold a cup of water.     [place] sometimes, 10/10 4. WORK OR EXERCISE: "Has there been any recent work or exercise that involved this part of the body?"     denies 5. CAUSE: "What do you think is causing the arm pain?"     no 6. OTHER SYMPTOMS: "Do you have any other symptoms?" (e.g., neck pain, swelling, rash, fever, numbness, weakness)     denies 7. PREGNANCY: "Is there any chance you are pregnant?" "When was your last menstrual period?"     na  Protocols used: Arm Pain-A-AH

## 2023-07-30 NOTE — Telephone Encounter (Signed)
 noted

## 2023-08-24 ENCOUNTER — Ambulatory Visit (INDEPENDENT_AMBULATORY_CARE_PROVIDER_SITE_OTHER): Payer: Self-pay | Admitting: Orthopaedic Surgery

## 2023-08-24 DIAGNOSIS — M25531 Pain in right wrist: Secondary | ICD-10-CM

## 2023-08-24 NOTE — Progress Notes (Signed)
 Office Visit Note   Patient: Ryan Rojas           Date of Birth: Aug 08, 1965           MRN: [id number] Visit Date: 08/24/2023              Requested by: Roise Cleaver, MD [id number] [place],  [place] [address] PCP: Roise Cleaver, MD   Assessment & Plan: Visit Diagnoses:  1. Pain in right wrist     Plan: History of Present Illness Ryan Rojas is a 57 year old male who presents with right wrist pain following a work-related injury.  On May 6th, he sustained a right wrist injury while using a drill that twisted his wrist, causing constant pain across the top of the wrist. The wrist locks up with unbearable pain when attempting to use his hand behind him. An MRI shows a torn ligament in the carpus. Examination reveals minor swelling and tenderness in the wrist, with good sensation in the fingertips and good pulses.  Physical Exam EXTREMITIES: Minor swelling in right wrist. Sensation intact in fingertips. Good pulses in right wrist. Flexibility limited by pain in right wrist. Tenderness in right wrist.  Results RADIOLOGY Right wrist MRI: Torn SL ligament (07/20/2023)  Assessment and Plan Torn SL ligament MRI report reviewed with patient. Constant symptoms since injury. - Refer to hand surgeon, Dr. Donelle Funk. - Coordinate with workers' compensation for referral approval.  Follow-Up Instructions: No follow-ups on file.   Orders:  Orders Placed This Encounter  Procedures   Ambulatory referral to Orthopedic Surgery   No orders of the defined types were placed in this encounter.     Procedures: No procedures performed   Clinical Data: No additional findings.   Subjective: Chief Complaint  Patient presents with   Right Wrist - Pain    HPI  Review of Systems  Constitutional: Negative.   HENT: Negative.    Eyes: Negative.   Respiratory: Negative.    Cardiovascular: Negative.   Gastrointestinal: Negative.   Endocrine: Negative.   Genitourinary: Negative.    Skin: Negative.   Allergic/Immunologic: Negative.   Neurological: Negative.   Hematological: Negative.   Psychiatric/Behavioral: Negative.    All other systems reviewed and are negative.    Objective: Vital Signs: There were no vitals taken for this visit.  Physical Exam Vitals and nursing note reviewed.  Constitutional:      Appearance: He is well-developed.  HENT:     Head: Normocephalic and atraumatic.  Eyes:     Pupils: Pupils are equal, round, and reactive to light.  Pulmonary:     Effort: Pulmonary effort is normal.  Abdominal:     Palpations: Abdomen is soft.  Musculoskeletal:        General: Normal range of motion.     Cervical back: Neck supple.  Skin:    General: Skin is warm.  Neurological:     Mental Status: He is alert and oriented to person, place, and time.  Psychiatric:        Behavior: Behavior normal.        Thought Content: Thought content normal.        Judgment: Judgment normal.     Ortho Exam  Specialty Comments:  No specialty comments available.  Imaging: No results found.   PMFS History: Patient Active Problem List   Diagnosis Date Noted   Allergic rhinitis 02/05/2023   Severe asthma without complication 01/26/2023   Low back pain without sciatica 12/31/2021  Tendinopathy of right biceps tendon 12/23/2021   Carpal tunnel syndrome of right wrist 12/09/2021   Chronic midline low back pain with right-sided sciatica 12/09/2021   Paresthesia 12/09/2021   Nontraumatic incomplete tear of right rotator cuff 12/09/2021   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   At risk for central sleep apnea 11/05/2021   Sleep related headaches 11/05/2021   Right hand paresthesia 09/11/2021   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   Obstructive sleep apnea 09/11/2021   Nontraumatic complete tear of left rotator cuff 05/28/2021   Impingement syndrome of left shoulder 05/28/2021   Tendinopathy of left biceps tendon 05/28/2021    Arm paresthesia, left 01/02/2021   Chronic right shoulder pain 01/02/2021   Other fatigue 01/02/2021   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   Neuropathic pain 09/30/2020   Left arm weakness 09/17/2020   Left arm pain 09/17/2020   Neck pain on left side 09/17/2020   Cubital tunnel syndrome on left 08/16/2020   Intractable neuropathic pain of lumbosacral origin 05/20/2020   Combined arterial insufficiency and corporo-venous occlusive erectile dysfunction 09/27/2019   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   Impotence 01/18/2018   Hyperlipidemia 07/16/2017   Past Medical History:  Diagnosis Date   Back pain    Community acquired pneumonia 09/07/2022   Hyperlipidemia    Risk factors for obstructive sleep apnea 11/05/2021   S/P left rotator cuff repair 12/09/2021    Family History  Problem Relation Age of Onset   Asthma Mother    Diabetes Sister    Asthma Sister     Past Surgical History:  Procedure Laterality Date   COLONOSCOPY WITH PROPOFOL  N/A 02/24/2022   Procedure: COLONOSCOPY WITH PROPOFOL ;  Surgeon: Vinetta Greening, DO;  Location: [facility];  Service: Endoscopy;  Laterality: N/A;  10:30 AM, moved to 12/12 at 12:30   ROTATOR CUFF REPAIR Left 07/10/2021   November he had the Right one done   Social History   Occupational History   Not on file  Tobacco Use   Smoking status: Never   Smokeless tobacco: Never  Vaping Use   Vaping status: Never Used  Substance and Sexual Activity   Alcohol use: Yes    Alcohol/week: 1.0 standard drink of alcohol    Types: 1 Cans of beer per week   Drug use: No   Sexual activity: Yes    Birth control/protection: None

## 2023-09-14 ENCOUNTER — Encounter: Admitting: Orthopedic Surgery

## 2023-11-04 ENCOUNTER — Other Ambulatory Visit: Payer: Self-pay | Admitting: Family Medicine

## 2023-11-04 DIAGNOSIS — E782 Mixed hyperlipidemia: Secondary | ICD-10-CM

## 2023-11-30 ENCOUNTER — Other Ambulatory Visit: Payer: Self-pay | Admitting: Family Medicine

## 2023-11-30 DIAGNOSIS — E782 Mixed hyperlipidemia: Secondary | ICD-10-CM

## 2023-11-30 NOTE — Telephone Encounter (Signed)
 Pt scheduled appt for 09/24 and he has enough medication to last until then

## 2023-11-30 NOTE — Telephone Encounter (Signed)
 Stacks pt NTBS 30-d given 11/04/23

## 2023-12-03 ENCOUNTER — Other Ambulatory Visit: Payer: Self-pay | Admitting: Family Medicine

## 2023-12-03 DIAGNOSIS — E782 Mixed hyperlipidemia: Secondary | ICD-10-CM

## 2023-12-08 ENCOUNTER — Encounter: Payer: Self-pay | Admitting: Family Medicine

## 2023-12-08 ENCOUNTER — Ambulatory Visit (INDEPENDENT_AMBULATORY_CARE_PROVIDER_SITE_OTHER): Admitting: Family Medicine

## 2023-12-08 DIAGNOSIS — J4551 Severe persistent asthma with (acute) exacerbation: Secondary | ICD-10-CM

## 2023-12-08 DIAGNOSIS — E66811 Obesity, class 1: Secondary | ICD-10-CM | POA: Diagnosis not present

## 2023-12-08 DIAGNOSIS — E782 Mixed hyperlipidemia: Secondary | ICD-10-CM | POA: Diagnosis not present

## 2023-12-08 MED ORDER — ATORVASTATIN CALCIUM 40 MG PO TABS: 40.0000 mg | ORAL_TABLET | Freq: Every day | ORAL | 0 refills | Status: AC

## 2023-12-08 MED ORDER — BUDESONIDE 0.5 MG/2ML IN SUSP: 0.5000 mg | Freq: Two times a day (BID) | RESPIRATORY_TRACT | 11 refills | Status: AC

## 2023-12-08 MED ORDER — LEVALBUTEROL TARTRATE 45 MCG/ACT IN AERO: 2.0000 | INHALATION_SPRAY | RESPIRATORY_TRACT | 12 refills | Status: AC | PRN

## 2023-12-08 MED ORDER — TADALAFIL 5 MG PO TABS: 5.0000 mg | ORAL_TABLET | Freq: Every day | ORAL | 11 refills | Status: AC

## 2023-12-08 MED ORDER — BUDESONIDE-FORMOTEROL FUMARATE 160-4.5 MCG/ACT IN AERO: 2.0000 | INHALATION_SPRAY | Freq: Two times a day (BID) | RESPIRATORY_TRACT | 11 refills | Status: AC

## 2023-12-08 MED ORDER — PHENTERMINE HCL 37.5 MG PO CAPS: 37.5000 mg | ORAL_CAPSULE | ORAL | 2 refills | Status: AC

## 2023-12-08 MED ORDER — ALBUTEROL SULFATE HFA 108 (90 BASE) MCG/ACT IN AERS: 2.0000 | INHALATION_SPRAY | RESPIRATORY_TRACT | 3 refills | Status: AC | PRN

## 2023-12-08 NOTE — Progress Notes (Signed)
 "  Subjective:  Patient ID: Ryan Rojas, male    DOB: 08/23/1965  Age: 58 y.o. MRN: [id number]  CC: Medical Management of Chronic Issues   HPI  Discussed the use of AI scribe software for clinical note transcription with the patient, who gave verbal consent to proceed.  History of Present Illness Ryan Rojas is a 58 year old male with asthma and hyperlipidemia who presents for medication management and follow-up.  He uses Dulera two puffs in the morning and evening, budesonide  for the nebulizer as needed, and albuterol  or levalbuterol  inhalers during flare-ups. Dulera is used daily, while budesonide  is now used as needed rather than twice daily. He experiences occasional shortness of breath, particularly at night, which is relieved by using his inhaler. No recent asthma attacks.  He is currently taking atorvastatin  for cholesterol management. He previously used phentermine  for weight loss, which was effective, but discontinued it after the initial course.  He has been using sildenafil  for erectile dysfunction but reports inconsistent effectiveness and a change in taste between prescriptions.  He has a history of brachial plexopathy related to a work injury, which has been managed with therapy and medication. He reports ongoing pain that sometimes disrupts his sleep, but he is no longer in active therapy and is managing the pain as needed. He has a history of allergic reactions to opiates, which cause itching, and he avoids these medications. He reports that he is still in contact with the pain management team for ongoing issues.          12/08/2023    1:27 PM 04/01/2023    9:47 AM 12/30/2022    3:12 PM  Depression screen PHQ 2/9  Decreased Interest 0 0 0  Down, Depressed, Hopeless 0 0 0  PHQ - 2 Score 0 0 0  Altered sleeping 0 0   Tired, decreased energy 0 0   Change in appetite 0 0   Feeling bad or failure about yourself  0 0   Trouble concentrating 0 0   Moving slowly or  fidgety/restless 0 0   Suicidal thoughts 0 0   PHQ-9 Score 0 0   Difficult doing work/chores Not difficult at all Not difficult at all     History Ervine has a past medical history of Back pain, Community acquired pneumonia (09/07/2022), Hyperlipidemia, Risk factors for obstructive sleep apnea (11/05/2021), and S/P left rotator cuff repair (12/09/2021).   He has a past surgical history that includes Rotator cuff repair (Left, 07/10/2021) and Colonoscopy with propofol  (N/A, 02/24/2022).   His family history includes Asthma in his mother and sister; Diabetes in his sister.He reports that he has never smoked. He has never used smokeless tobacco. He reports current alcohol use of about 1.0 standard drink of alcohol per week. He reports that he does not use drugs.    ROS Review of Systems  Constitutional: Negative.   HENT: Negative.    Eyes:  Negative for visual disturbance.  Respiratory:  Negative for cough and shortness of breath.   Cardiovascular:  Negative for chest pain and leg swelling.  Gastrointestinal:  Negative for abdominal pain, diarrhea, nausea and vomiting.  Genitourinary:  Negative for difficulty urinating.  Musculoskeletal:  Negative for arthralgias and myalgias.  Skin:  Negative for rash.  Neurological:  Negative for headaches.  Psychiatric/Behavioral:  Negative for sleep disturbance.     Objective:  BP (!) 101/57   Pulse 82   Temp 98.5 F (36.9 C)   Ht 6' (1.829 m)  Wt 238 lb (108 kg)   SpO2 96%   BMI 32.28 kg/m   BP Readings from Last 3 Encounters:  12/08/23 (!) 101/57  04/05/23 128/86  04/01/23 108/66    Wt Readings from Last 3 Encounters:  12/08/23 238 lb (108 kg)  04/05/23 240 lb 12.8 oz (109.2 kg)  04/01/23 241 lb (109.3 kg)     Physical Exam Vitals reviewed.  Constitutional:      Appearance: He is well-developed.  HENT:     Head: Normocephalic and atraumatic.     Right Ear: External ear normal.     Left Ear: External ear normal.      Mouth/Throat:     Pharynx: No oropharyngeal exudate or posterior oropharyngeal erythema.  Eyes:     Pupils: Pupils are equal, round, and reactive to light.  Cardiovascular:     Rate and Rhythm: Normal rate and regular rhythm.     Heart sounds: No murmur heard. Pulmonary:     Effort: No respiratory distress.     Breath sounds: Normal breath sounds.  Musculoskeletal:     Cervical back: Normal range of motion and neck supple.  Neurological:     Mental Status: He is alert and oriented to person, place, and time.      Assessment & Plan:  Severe persistent asthma with acute exacerbation -     Albuterol  Sulfate HFA; Inhale 2 puffs into the lungs every 4 (four) hours as needed for wheezing or shortness of breath.  Dispense: 8.5 each; Refill: 3 -     Budesonide ; Take 2 mLs (0.5 mg total) by nebulization in the morning and at bedtime.  Dispense: 120 mL; Refill: 11  Mixed hyperlipidemia -     Atorvastatin  Calcium ; Take 1 tablet (40 mg total) by mouth daily.  Dispense: 90 tablet; Refill: 0 -     CMP14+EGFR -     Lipid panel  Obesity (BMI 30.0-34.9) -     Phentermine  HCl; Take 1 capsule (37.5 mg total) by mouth every morning.  Dispense: 30 capsule; Refill: 2  Other orders -     Budesonide -Formoterol  Fumarate; Inhale 2 puffs into the lungs 2 (two) times daily.  Dispense: 10.2 g; Refill: 11 -     Levalbuterol  Tartrate; Inhale 2 puffs into the lungs every 4 (four) hours as needed for wheezing.  Dispense: 1 each; Refill: 12 -     Tadalafil ; Take 1 tablet (5 mg total) by mouth daily.  Dispense: 30 tablet; Refill: 11    Assessment and Plan Assessment & Plan Asthma with intermittent shortness of breath and chronic cough   Asthma is managed with inhalers. Levalbuterol  is preferred over albuterol  due to fewer side effects like jitteriness. Continue Dulera inhaler, 2 puffs twice daily. Use budesonide  nebulizer solution as needed. Use albuterol  or levalbuterol  inhalers for acute symptoms.  Discontinue Singulair  and Flonase .  Obesity   Restart phentermine  for weight loss, using it for 3-6 months on and then 3 months off.  Hyperlipidemia   Hyperlipidemia is managed with atorvastatin . Continue atorvastatin  40 mg daily. Order cholesterol and liver function tests.  Erectile dysfunction   Erectile dysfunction is not adequately managed with sildenafil , with reports of variability in effectiveness and taste. Tadalafil  is considered as an alternative with a dosing strategy to enhance effectiveness. Switch to tadalafil  5 mg daily, with instructions to double the dose on the day of sexual activity and skip the following day.       Follow-up: Return in about 6 months (around  06/06/2024).  Butler Der, M.D. "

## 2023-12-09 LAB — CMP14+EGFR
ALT: 16 IU/L (ref 0–44)
AST: 18 IU/L (ref 0–40)
Albumin: 4.5 g/dL (ref 3.8–4.9)
Alkaline Phosphatase: 84 IU/L (ref 47–123)
BUN/Creatinine Ratio: 11 (ref 9–20)
BUN: 11 mg/dL (ref 6–24)
Bilirubin Total: 0.4 mg/dL (ref 0.0–1.2)
CO2: 26 mmol/L (ref 20–29)
Calcium: 9.1 mg/dL (ref 8.7–10.2)
Chloride: 104 mmol/L (ref 96–106)
Creatinine, Ser: 1.01 mg/dL (ref 0.76–1.27)
Globulin, Total: 2 g/dL (ref 1.5–4.5)
Glucose: 83 mg/dL (ref 70–99)
Potassium: 4.2 mmol/L (ref 3.5–5.2)
Sodium: 141 mmol/L (ref 134–144)
Total Protein: 6.5 g/dL (ref 6.0–8.5)
eGFR: 87 mL/min/1.73 (ref >59)

## 2023-12-09 LAB — LIPID PANEL
Chol/HDL Ratio: 3.5 ratio (ref 0.0–5.0)
Cholesterol, Total: 177 mg/dL (ref 100–199)
HDL: 51 mg/dL (ref >39)
LDL Chol Calc (NIH): 108 mg/dL — ABNORMAL HIGH (ref 0–99)
Triglycerides: 101 mg/dL (ref 0–149)
VLDL Cholesterol Cal: 18 mg/dL (ref 5–40)

## 2023-12-10 ENCOUNTER — Ambulatory Visit: Payer: Self-pay | Admitting: Family Medicine

## 2023-12-10 NOTE — Progress Notes (Signed)
Hello Brack,  Your lab result is normal and/or stable.Some minor variations that are not significant are commonly marked abnormal, but do not represent any medical problem for you.  Best regards, Warren Stacks, M.D.

## 2024-01-11 ENCOUNTER — Ambulatory Visit (INDEPENDENT_AMBULATORY_CARE_PROVIDER_SITE_OTHER): Admitting: Family

## 2024-01-11 ENCOUNTER — Ambulatory Visit: Payer: Self-pay | Admitting: *Deleted

## 2024-01-11 ENCOUNTER — Telehealth: Admitting: Family Medicine

## 2024-01-11 ENCOUNTER — Encounter: Payer: Self-pay | Admitting: Family

## 2024-01-11 VITALS — BP 120/71 | HR 92 | Temp 97.6°F | Ht 72.0 in | Wt 239.8 lb

## 2024-01-11 DIAGNOSIS — J4550 Severe persistent asthma, uncomplicated: Secondary | ICD-10-CM | POA: Diagnosis not present

## 2024-01-11 MED ORDER — PREDNISONE 10 MG (21) PO TBPK: ORAL_TABLET | ORAL | 0 refills | Status: DC

## 2024-01-11 MED ORDER — MONTELUKAST SODIUM 10 MG PO TABS: 10.0000 mg | ORAL_TABLET | Freq: Every day | ORAL | 1 refills | Status: AC

## 2024-01-11 NOTE — Telephone Encounter (Signed)
Noted  -LS

## 2024-01-11 NOTE — Patient Instructions (Signed)
 Asthma, Adult  Asthma is a long-term (chronic) condition that causes recurrent episodes in which the lower airways in the lungs become tight and narrow. The narrowing is caused by inflammation and tightening of the smooth muscle around the lower airways. Asthma episodes, also called asthma attacks or asthma flares, may cause coughing, making high-pitched whistling sounds when you breathe, most often when you breathe out (wheezing), shortness of breath, and chest pain. The airways may produce extra mucus caused by the inflammation and irritation. During an attack, it can be difficult to breathe. Asthma attacks can range from minor to life-threatening. Asthma cannot be cured, but medicines and lifestyle changes can help control it and treat acute attacks. It is important to keep your asthma well controlled so the condition does not interfere with your daily life. What are the causes? This condition is believed to be caused by inherited (genetic) and environmental factors, but its exact cause is not known. What can trigger an asthma attack? Many things can bring on an asthma attack or make symptoms worse. These triggers are different for every person. Common triggers include: Allergens and irritants like mold, dust, pet dander, cockroaches, pollen, air pollution, and chemical odors. Cigarette smoke. Weather changes and cold air. Stress and strong emotional responses such as crying or laughing hard. Certain medications such as aspirin or beta blockers. Infections and inflammatory conditions, such as the flu, a cold, pneumonia, or inflammation of the nasal membranes (rhinitis). Gastroesophageal reflux disease (GERD). What are the signs or symptoms? Symptoms may occur right after exposure to an asthma trigger or hours later and can vary by person. Common signs and symptoms include: Wheezing. Trouble breathing (shortness of breath). Excessive nighttime or early morning coughing. Chest  tightness. Tiredness (fatigue) with minimal activity. Difficulty talking in complete sentences. Poor exercise tolerance. How is this diagnosed? This condition is diagnosed based on: A physical exam and your medical history. Tests, which may include: Lung function studies to evaluate the flow of air in your lungs. Allergy tests. Imaging tests, such as X-rays. How is this treated? There is no cure, but symptoms can be controlled with proper treatment. Treatment usually involves: Identifying and avoiding your asthma triggers. Inhaled medicines. Two types are commonly used to treat asthma, depending on severity: Controller medicines. These help prevent asthma symptoms from occurring. They are taken every day. Fast-acting reliever or rescue medicines. These quickly relieve asthma symptoms. They are used as needed and provide short-term relief. Using other medicines, such as: Allergy medicines, such as antihistamines, if your asthma attacks are triggered by allergens. Immune medicines (immunomodulators). These are medicines that help control the immune system. Using supplemental oxygen. This is only needed during a severe episode. Creating an asthma action plan. An asthma action plan is a written plan for managing and treating your asthma attacks. This plan includes: A list of your asthma triggers and how to avoid them. Information about when medicines should be taken and when their dosage should be changed. Instructions about using a device called a peak flow meter. A peak flow meter measures how well the lungs are working and the severity of your asthma. It helps you monitor your condition. Follow these instructions at home: Take over-the-counter and prescription medicines only as told by your health care provider. Stay up to date on all vaccinations as recommended by your healthcare provider, including vaccines for the flu and pneumonia. Use a peak flow meter and keep track of your peak flow  readings. Understand and use your asthma  action plan to address any asthma flares. Do not smoke or allow anyone to smoke in your home. Contact a health care provider if: You have wheezing, shortness of breath, or a cough that is not responding to medicines. Your medicines are causing side effects, such as a rash, itching, swelling, or trouble breathing. You need to use a reliever medicine more than 2-3 times a week. Your peak flow reading is still at 50-79% of your personal best after following your action plan for 1 hour. You have a fever and shortness of breath. Get help right away if: You are getting worse and do not respond to treatment during an asthma attack. You are short of breath when at rest or when doing very little physical activity. You have difficulty eating, drinking, or talking. You have chest pain or tightness. You develop a fast heartbeat or palpitations. You have a bluish color to your lips or fingernails. You are light-headed or dizzy, or you faint. Your peak flow reading is less than 50% of your personal best. You feel too tired to breathe normally. These symptoms may be an emergency. Get help right away. Call 911. Do not wait to see if the symptoms will go away. Do not drive yourself to the hospital. Summary Asthma is a long-term (chronic) condition that causes recurrent episodes in which the airways become tight and narrow. Asthma episodes, also called asthma attacks or asthma flares, can cause coughing, wheezing, shortness of breath, and chest pain. Asthma cannot be cured, but medicines and lifestyle changes can help keep it well controlled and prevent asthma flares. Make sure you understand how to avoid triggers and how and when to use your medicines. Asthma attacks can range from minor to life-threatening. Get help right away if you have an asthma attack and do not respond to treatment with your usual rescue medicines. This information is not intended to replace  advice given to you by your health care provider. Make sure you discuss any questions you have with your health care provider. Document Revised: 12/18/2020 Document Reviewed: 12/09/2020 Elsevier Patient Education  2024 [employer].

## 2024-01-11 NOTE — Progress Notes (Signed)
 Subjective:    Patient ID: Ryan Rojas, male    DOB: Aug 13, 1965, 58 y.o.   MRN: [id number]  Chief Complaint  Patient presents with   Asthma   Pt presents to the office today with SOB that started last week. Has asthma.  Asthma He complains of chest tightness, cough (dry), shortness of breath, sputum production (some times) and wheezing. There is no difficulty breathing or frequent throat clearing. The current episode started 1 to 4 weeks ago. The problem has been gradually worsening. Associated symptoms include dyspnea on exertion. Pertinent negatives include no ear congestion, ear pain, fever, nasal congestion, rhinorrhea, sneezing, sore throat or trouble swallowing. His symptoms are aggravated by exercise. His symptoms are alleviated by beta-agonist and rest. He reports minimal improvement on treatment. His symptoms are not alleviated by rest. His past medical history is significant for asthma.      Review of Systems  Constitutional:  Negative for fever.  HENT:  Negative for ear pain, rhinorrhea, sneezing, sore throat and trouble swallowing.   Respiratory:  Positive for cough (dry), sputum production (some times), shortness of breath and wheezing.   Cardiovascular:  Positive for dyspnea on exertion.  All other systems reviewed and are negative.   Social History   Socioeconomic History   Marital status: Single    Spouse name: Not on file   Number of children: 3   Years of education: Not on file   Highest education level: High school graduate  Occupational History   Not on file  Tobacco Use   Smoking status: Never   Smokeless tobacco: Never  Vaping Use   Vaping status: Never Used  Substance and Sexual Activity   Alcohol use: Yes    Alcohol/week: 1.0 standard drink of alcohol    Types: 1 Cans of beer per week   Drug use: No   Sexual activity: Yes    Birth control/protection: None  Other Topics Concern   Not on file  Social History Narrative   Lives alone   L handed    Caffeine: 1 C of coffee every AM   Social Drivers of Corporate Investment Banker Strain: Not on file  Food Insecurity: No Food Insecurity (12/08/2023)   Hunger Vital Sign    Worried About Running Out of Food in the Last Year: Never true    Ran Out of Food in the Last Year: Never true  Transportation Needs: No Transportation Needs (12/08/2023)   PRAPARE - Administrator, Civil Service (Medical): No    Lack of Transportation (Non-Medical): No  Physical Activity: Not on file  Stress: Not on file  Social Connections: Not on file   Family History  Problem Relation Age of Onset   Asthma Mother    Diabetes Sister    Asthma Sister         Objective:   Physical Exam Vitals reviewed.  Constitutional:      General: He is not in acute distress.    Appearance: He is well-developed.  HENT:     Head: Normocephalic.     Right Ear: Tympanic membrane and external ear normal.     Left Ear: Tympanic membrane and external ear normal.  Eyes:     General:        Right eye: No discharge.        Left eye: No discharge.     Pupils: Pupils are equal, round, and reactive to light.  Neck:     Thyroid: No  thyromegaly.  Cardiovascular:     Rate and Rhythm: Normal rate and regular rhythm.     Heart sounds: Normal heart sounds. No murmur heard. Pulmonary:     Effort: Pulmonary effort is normal. No respiratory distress.     Breath sounds: Normal breath sounds. No wheezing.     Comments: Coarse nonproductive cough Abdominal:     General: Bowel sounds are normal. There is no distension.     Palpations: Abdomen is soft.     Tenderness: There is no abdominal tenderness.  Musculoskeletal:        General: No tenderness. Normal range of motion.     Cervical back: Normal range of motion and neck supple.  Skin:    General: Skin is warm and dry.     Findings: No erythema or rash.  Neurological:     Mental Status: He is alert and oriented to person, place, and time.     Cranial Nerves:  No cranial nerve deficit.     Deep Tendon Reflexes: Reflexes are normal and symmetric.  Psychiatric:        Behavior: Behavior normal.        Thought Content: Thought content normal.        Judgment: Judgment normal.       BP 120/71   Pulse 92   Temp 97.6 F (36.4 C) (Temporal)   Ht 6' (1.829 m)   Wt 239 lb 12.8 oz (108.8 kg)   SpO2 98%   BMI 32.52 kg/m      Assessment & Plan:  Divonte Senger comes in today with chief complaint of Asthma   Diagnosis and orders addressed:  1. Uncontrolled severe persistent asthma (HCC) (Primary) Start prednisone   Start zyrtec and Singulair   Continue Symbicort  BID  Keep follow up with Pulmonologist and PCP - predniSONE  (STERAPRED UNI-PAK 21 TAB) 10 MG (21) TBPK tablet; Use as directed  Dispense: 21 tablet; Refill: 0 - montelukast  (SINGULAIR ) 10 MG tablet; Take 1 tablet (10 mg total) by mouth at bedtime.  Dispense: 90 tablet; Refill: 1      Bari Learn, FNP

## 2024-01-11 NOTE — Progress Notes (Signed)
  Because you are having uncontrolled asthma that has been persistent and you recently had prednisone  in last month, you need to follow up in person to get this uncontrol. We feel your condition warrants further evaluation and recommend that you be seen in a face-to-face visit at your local Urgent Care and follow up with your PCP as well.    NOTE: There will be NO CHARGE for this E-Visit   If you are having a true medical emergency, please call 911.

## 2024-01-11 NOTE — Telephone Encounter (Signed)
 No available appt with PCP until tomorrow. Scheduled with DOD today. Patient last ED visit 12/21/23. Now reports sx again.  FYI Only or Action Required?: FYI only for provider.  Patient was last seen in primary care on 12/08/2023 by Zollie Lowers, MD.  Called Nurse Triage reporting Breathing Problem.  Symptoms began several days ago. 4-5 days ago   Interventions attempted: Prescription medications: breathing treatments.  Symptoms are: gradually worsening.  Triage Disposition: See HCP Within 4 Hours (Or PCP Triage)  Patient/caregiver understands and will follow disposition?: Yes                Copied from CRM #[id number]. Topic: Clinical - Red Word Triage >> Jan 11, 2024  9:48 AM Tobias L wrote: Red Word that prompted transfer to Nurse Triage: patient having an asthma flare, trouble breathing Reason for Disposition  [1] Longstanding difficulty breathing (e.g., CHF, COPD, emphysema) AND [2] WORSE than normal  Answer Assessment - Initial Assessment Questions Appt scheduled today with DOD due to SOB with exertion. Recommended if sx worsen go to ED.       1. RESPIRATORY STATUS: Describe your breathing? (e.g., wheezing, shortness of breath, unable to speak, severe coughing)      Shortness of breath with exertion, cough productive , wheezing  2. ONSET: When did this breathing problem begin?      4-5 days ago  3. PATTERN Does the difficult breathing come and go, or has it been constant since it started?      Comes and goes  4. SEVERITY: How bad is your breathing? (e.g., mild, moderate, severe)      Can breath only mild wheezing and coughing up phlegm.  5. RECURRENT SYMPTOM: Have you had difficulty breathing before? If Yes, ask: When was the last time? and What happened that time?      Yes  last few weeks  6. CARDIAC HISTORY: Do you have any history of heart disease? (e.g., heart attack, angina, bypass surgery, angioplasty)      Na  7. LUNG HISTORY: Do you  have any history of lung disease?  (e.g., pulmonary embolus, asthma, emphysema)     Hx asthma 8. CAUSE: What do you think is causing the breathing problem?      Needs prednisone  9. OTHER SYMPTOMS: Do you have any other symptoms? (e.g., chest pain, cough, dizziness, fever, runny nose)     Cough, productive  does not know color. No chest pain no difficulty breathing. Reports using inhalers breathing treatments as needed.  10. O2 SATURATION MONITOR:  Do you use an oxygen saturation monitor (pulse oximeter) at home? If Yes, ask: What is your reading (oxygen level) today? What is your usual oxygen saturation reading? (e.g., 95%)       na 11. PREGNANCY: Is there any chance you are pregnant? When was your last menstrual period?       na 12. TRAVEL: Have you traveled out of the country in the last month? (e.g., travel history, exposures)       na  Protocols used: Breathing Difficulty-A-AH

## 2024-01-17 ENCOUNTER — Encounter: Payer: Self-pay | Admitting: Radiology

## 2024-03-03 ENCOUNTER — Other Ambulatory Visit: Payer: Self-pay | Admitting: Family Medicine

## 2024-03-03 DIAGNOSIS — N5203 Combined arterial insufficiency and corporo-venous occlusive erectile dysfunction: Secondary | ICD-10-CM

## 2024-03-28 ENCOUNTER — Telehealth: Payer: Self-pay | Admitting: Family Medicine

## 2024-03-28 NOTE — Telephone Encounter (Signed)
 Not on active med list:  sildenafil  citrate (VIAGRA ) 100 mg Tablet  Patient stated that the replacement medication he has been trying did not work.

## 2024-03-28 NOTE — Telephone Encounter (Unsigned)
 Copied from CRM [id number]. Topic: Clinical - Medication Refill >> Mar 28, 2024  1:45 PM [name] wrote: Medication: sildenafil  citrate (VIAGRA ) 100 mg Tablet Patient stated that the replacement medication he has been trying did not work.   Has the patient contacted their pharmacy? Yes (Agent: If no, request that the patient contact the pharmacy for the refill. If patient does not wish to contact the pharmacy document the reason why and proceed with request.) (Agent: If yes, when and what did the pharmacy advise?)  This is the patient's preferred pharmacy:  [id number] - [place], [place] - [address] [facility] [address] [place] [address] Phone: [phone] Fax: [phone]  Is this the correct pharmacy for this prescription? Yes If no, delete pharmacy and type the correct one.   Has the prescription been filled recently? No  Is the patient out of the medication? Yes  Has the patient been seen for an appointment in the last year OR does the patient have an upcoming appointment? Yes  Can we respond through MyChart? Yes  Agent: Please be advised that Rx refills may take up to 3 business days. We ask that you follow-up with your pharmacy.

## 2024-03-30 ENCOUNTER — Other Ambulatory Visit: Payer: Self-pay | Admitting: Nurse Practitioner

## 2024-03-30 DIAGNOSIS — N529 Male erectile dysfunction, unspecified: Secondary | ICD-10-CM

## 2024-03-30 MED ORDER — SILDENAFIL CITRATE 50 MG PO TABS: 50.0000 mg | ORAL_TABLET | ORAL | 0 refills | Status: AC | PRN

## 2024-03-30 NOTE — Progress Notes (Signed)
 Patient reports that Cialis  is not working for him and would like to go back to Viagra .  He is on 5 mg of Cialis , we will start him on 50 mg of Viagra .  Rx sent to his pharmacy

## 2024-04-03 NOTE — Telephone Encounter (Signed)
Noted  -LS

## 2024-04-11 ENCOUNTER — Telehealth: Admitting: Emergency Medicine

## 2024-04-11 DIAGNOSIS — J4550 Severe persistent asthma, uncomplicated: Secondary | ICD-10-CM

## 2024-04-11 MED ORDER — PREDNISONE 10 MG (21) PO TBPK: ORAL_TABLET | ORAL | 0 refills | Status: AC

## 2024-04-11 NOTE — Progress Notes (Signed)
 "                                                                                                                E Visit for Asthma  Based on what you have shared with me, it looks like you may have a flare up of your asthma.  Asthma is a chronic (ongoing) lung disease which results in airway obstruction, inflammation and hyper-responsiveness.   Asthma symptoms vary from person to person, with common symptoms including nighttime awakening and decreased ability to participate in normal activities due to shortness of breath. It is often triggered by changes in weather, changes in the season, changes in air temperature, or inside (home, school, daycare or work) allergens such as animal dander, mold, mildew, woodstoves or cockroaches.   It can also be triggered by hormonal changes, extreme emotion, physical exertion or an upper respiratory tract illness.     It is important to identify the trigger and eliminate or avoid the trigger if possible.   If you have been prescribed medications to be taken on a regular basis, it is important to follow the asthma action plan and to follow guidelines to adjust medication in response to increasing symptoms of decreased peak expiratory flow rate.  Treatment: I have prescribed: prednisone  as requested. If this plus your inhalers are not helping your symptoms, or if you are getting worse, please be checked in person.   HOME CARE Only take medications as instructed by your medical team. Consider wearing a mask or scarf to improve breathing when there is poor air quality as this has been shown to decrease irritation and decrease exacerbations Get rest. Using a humidifier may help nasal congestion and ease sore throat pain. Using a saline nasal spray works much the same way.  Cough drops, hard candies and sore throat lozenges may ease your cough.  Avoid close contacts, especially the very you and the elderly. Cover your mouth if you cough or sneeze. Always remember  to wash your hands.   GET HELP RIGHT AWAY IF: You develop worsening shortness of breath/difficulty breathing or chest tightness, breathlessness at rest, drowsy, confused or agitated, unable to speak in full sentences, or if you develop chest pain.  You have coughing fits. You develop a severe headache or visual changes. You develop shortness of breath, difficulty breathing or start having chest pain. Your symptoms persist after you have completed your treatment plan. If your symptoms do not improve within 5 days.  MAKE SURE YOU Understand these instructions. Will watch your condition. Will get help right away if you are not doing well or get worse.   Your e-visit answers were reviewed by a board certified advanced clinical practitioner to complete your personal care plan, Depending upon the condition, your plan could have included both over the counter or prescription medications.  Please review your pharmacy choice. Your safety is important to us . If you have drug allergies check your prescription carefully. You can use MyChart to ask questions about today's visit, request a  non-urgent call back, or ask for a work or school excuse for 24 hours related to this e-Visit. If it has been greater than 24 hours you will need to follow up with your provider, or enter a new e-Visit to address those concerns.  You will get an e-mail in the next two days asking about your experience. I hope that your e-visit has been valuable and will speed your recovery. Thank you for using e-visits.  I have spent 5 minutes in review of e-visit questionnaire, review and updating patient chart, medical decision making and response to patient.   Jon Belt, PhD, FNP-BC     "

## 2024-06-07 ENCOUNTER — Ambulatory Visit: Payer: Self-pay | Admitting: Family Medicine
